# Patient Record
Sex: Female | Born: 1990 | Race: White | Hispanic: No | Smoking: Current every day smoker
Health system: Southern US, Community
[De-identification: ages and names within clinical notes are randomized; demographics above are authoritative.]

## PROBLEM LIST (undated history)

## (undated) DIAGNOSIS — G8929 Other chronic pain: Secondary | ICD-10-CM

## (undated) DIAGNOSIS — F4310 Post-traumatic stress disorder, unspecified: Secondary | ICD-10-CM

## (undated) DIAGNOSIS — R011 Cardiac murmur, unspecified: Secondary | ICD-10-CM

## (undated) DIAGNOSIS — R569 Unspecified convulsions: Secondary | ICD-10-CM

## (undated) DIAGNOSIS — E162 Hypoglycemia, unspecified: Secondary | ICD-10-CM

## (undated) DIAGNOSIS — R87629 Unspecified abnormal cytological findings in specimens from vagina: Secondary | ICD-10-CM

## (undated) DIAGNOSIS — F445 Conversion disorder with seizures or convulsions: Secondary | ICD-10-CM

## (undated) DIAGNOSIS — M25569 Pain in unspecified knee: Secondary | ICD-10-CM

## (undated) DIAGNOSIS — R51 Headache: Secondary | ICD-10-CM

## (undated) DIAGNOSIS — Z91148 Patient's other noncompliance with medication regimen for other reason: Secondary | ICD-10-CM

## (undated) DIAGNOSIS — Z9289 Personal history of other medical treatment: Secondary | ICD-10-CM

## (undated) DIAGNOSIS — F419 Anxiety disorder, unspecified: Secondary | ICD-10-CM

## (undated) DIAGNOSIS — R519 Headache, unspecified: Secondary | ICD-10-CM

## (undated) DIAGNOSIS — Z9114 Patient's other noncompliance with medication regimen: Secondary | ICD-10-CM

## (undated) HISTORY — PX: WISDOM TOOTH EXTRACTION: SHX21

## (undated) HISTORY — PX: NO PAST SURGERIES: SHX2092

## (undated) HISTORY — DX: Unspecified abnormal cytological findings in specimens from vagina: R87.629

## (undated) HISTORY — DX: Cardiac murmur, unspecified: R01.1

---
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
## 2009-03-22 ENCOUNTER — Emergency Department: Payer: Self-pay | Admitting: Emergency Medicine

## 2009-06-25 ENCOUNTER — Emergency Department: Payer: Self-pay | Admitting: Emergency Medicine

## 2009-07-04 ENCOUNTER — Emergency Department (HOSPITAL_COMMUNITY): Admission: EM | Admit: 2009-07-04 | Discharge: 2009-07-05 | Payer: Self-pay | Admitting: Emergency Medicine

## 2009-12-19 ENCOUNTER — Emergency Department (HOSPITAL_COMMUNITY): Admission: EM | Admit: 2009-12-19 | Discharge: 2009-12-19 | Payer: Self-pay | Admitting: Emergency Medicine

## 2010-05-13 LAB — DIFFERENTIAL
Basophils Absolute: 0 10*3/uL (ref 0.0–0.1)
Basophils Relative: 1 % (ref 0–1)
Eosinophils Absolute: 0.1 10*3/uL (ref 0.0–0.7)
Eosinophils Relative: 2 % (ref 0–5)
Lymphocytes Relative: 39 % (ref 12–46)
Lymphs Abs: 2.7 10*3/uL (ref 0.7–4.0)
Monocytes Absolute: 0.6 10*3/uL (ref 0.1–1.0)
Monocytes Relative: 8 % (ref 3–12)
Neutro Abs: 3.6 10*3/uL (ref 1.7–7.7)
Neutrophils Relative %: 51 % (ref 43–77)

## 2010-05-13 LAB — URINALYSIS, ROUTINE W REFLEX MICROSCOPIC
Leukocytes, UA: NEGATIVE
Nitrite: NEGATIVE
Protein, ur: NEGATIVE mg/dL
Urobilinogen, UA: 0.2 mg/dL (ref 0.0–1.0)

## 2010-05-13 LAB — COMPREHENSIVE METABOLIC PANEL
ALT: 9 U/L (ref 0–35)
AST: 16 U/L (ref 0–37)
Albumin: 3.7 g/dL (ref 3.5–5.2)
Alkaline Phosphatase: 55 U/L (ref 39–117)
BUN: 7 mg/dL (ref 6–23)
CO2: 23 mEq/L (ref 19–32)
Calcium: 9.2 mg/dL (ref 8.4–10.5)
Chloride: 108 mEq/L (ref 96–112)
Creatinine, Ser: 0.54 mg/dL (ref 0.4–1.2)
GFR calc Af Amer: >60 mL/min (ref >60)
GFR calc non Af Amer: >60 mL/min (ref >60)
Glucose, Bld: 100 mg/dL — ABNORMAL HIGH (ref 70–99)
Potassium: 3.6 mEq/L (ref 3.5–5.1)
Sodium: 137 mEq/L (ref 135–145)
Total Bilirubin: 0.4 mg/dL (ref 0.3–1.2)
Total Protein: 6.8 g/dL (ref 6.0–8.3)

## 2010-05-13 LAB — CBC
HCT: 39.8 % (ref 36.0–46.0)
Hemoglobin: 14.1 g/dL (ref 12.0–15.0)
MCHC: 35.4 g/dL (ref 30.0–36.0)
MCV: 86.5 fL (ref 78.0–100.0)
Platelets: 314 10*3/uL (ref 150–400)
RBC: 4.6 MIL/uL (ref 3.87–5.11)
RDW: 13.7 % (ref 11.5–15.5)
WBC: 7 10*3/uL (ref 4.0–10.5)

## 2010-05-13 LAB — RAPID URINE DRUG SCREEN, HOSP PERFORMED
Cocaine: NOT DETECTED
Tetrahydrocannabinol: NOT DETECTED

## 2010-05-13 LAB — PREGNANCY, URINE: Preg Test, Ur: NEGATIVE

## 2010-05-13 LAB — POCT PREGNANCY, URINE: Preg Test, Ur: NEGATIVE

## 2010-05-13 LAB — URINE MICROSCOPIC-ADD ON

## 2010-07-11 NOTE — H&P (Signed)
Patricia Irwin, Patricia Irwin NO.:  [id number]   MEDICAL RECORD NO.:  [id number]          PATIENT TYPE:  INP   LOCATION:  0103                          FACILITY:  [facility]   PHYSICIAN:  Lalla Brothers, MDDATE OF BIRTH:  07/04/1990   DATE OF ADMISSION:  12/11/2005  DATE OF DISCHARGE:                         PSYCHIATRIC ADMISSION ASSESSMENT   IDENTIFICATION:  This 20 year old female, 11th grade student at [employer], is admitted emergently voluntarily in transfer from [facility] Emergency Department for inpatient stabilization and treatment  of suicide and homicide risk with apparent depression and dangerous,  disruptive behavior.  The patient lacerated her right forearm with a razor,  producing two wounds treated in the emergency department with the patient  indicating if she was certain about dying she would cut longitudinally to  open up the artery and bleed to death quicker.  She also threatened homicide  to her 25 year old brother.  She was taken to the emergency department by  [facility] and then transported to the [facility] by  family and Child psychotherapist, apparently Bonnita Nasuti apparently with [facility].   HISTORY OF PRESENT ILLNESS:  The patient and family provide little  amplification and clarification of content and chronological course of  symptoms.  The patient reportedly has longstanding ADHD, treated by Dr.  Dolores Frame at [phone].  Apparently, she has been treated with Adderall  20 mg XR in the past but is currently taking Vyvanse, apparently the 30 mg  every morning, though the emergency room department listed as 20 mg.  The  patient has also had birth control pills in the past though apparently, when  taking birth control pill and a pain pill for dysmenorrhea and prolonged  vaginal bleeding, she had a vasovagal episode that the patient calls  seizure, but was not determined to  be a seizure in the emergency department.  The patient and family has apparently been working with Bonnita Nasuti at  [phone] and [facility] [phone] for disruptive  behavior, family difficulties, and complicating factors such as questions of  substance use and somatoform disorder difficulties.  Parents in the  emergency department imply cannabis abuse but the patient does not  acknowledge such.  Urine drug screen was negative in the emergency  department April 27, 2005 and again December 11, 2005.  She does smoke at  least five cigarettes daily, obtaining as many as she can on the bus and  then saving them so she will never completely run out.  The patient has had  numerous medical complaints in the past but has not had as many tests and  medical appointments as complaints.  She is under the primary care of Dr.  Reynolds Bowl.  She has complained of dysmenorrhea, excessive vaginal  bleeding, abdominal pain, constipation, reflux, headaches, lax knees  resulting in soreness after activities and lactose intolerance and seizures.  Medical testing and treatment has not verified diagnoses or needs of  significance currently.  The patient and 45 year old brother have apparently  been associating with  three adult males, ages 70, 71 and 77.  The patient  indicates that brother associates with them also but apparently brother has  become concerned that the patient is involved in activities with these men  beyond safety and emotional capacity.  Family suggests that police have  become involved.  The patient is not more specific about such activities at  this time.  She will not answer questions about sexual activity.  She became  overwhelmed as brother told the parents about the three man and these  activities.  The patient cut her right forearm with a razor and threatened  to kill brother and herself acutely.  Family was overwhelmed.  They do not  clarify the family mechanisms  by which the patient and brother are exposed  to these men.  The patient does not acknowledge other hypomanic or manic  symptoms.  The patient is thin and underweight with multiple contributing  factors potentially.  She does not acknowledge purging or body image  distortion..  The patient does not acknowledge specific anxiety but does  have multiple somatoform complaints over time.  She did not have definite  conversion symptoms as possible pseudoseizures.  The patient indicates a  desire for motherhood and states she likes children and pets.  She indicates  she is depressed again and apparently had such with self-cutting one year  ago and now has cut again.  She has no psychotic symptoms and no  dissociation.   PAST MEDICAL HISTORY:  The patient is under the primary care of Dr. Reynolds Bowl.  She reports a history of acid reflux, constipation, lactose  intolerance and abdominal pain with a negative ultrasound in March of 2003.  She reports dysmenorrhea, excessive menstrual bleeding, and potential  reaction to birth control pill efforts to regulate in the past with  vasovagal near syncope.  She had a negative CT scan of the head at that time  in May of 2007 in the emergency department but she does have headaches at  times.  She was at the dentist two weeks ago and has orthodontic braces.  Last menses was November 11, 2005.  She reports laxity of the knees and  soreness with activity.  She has acute right forearm lacerations.  She had  chicken pox at age 71.  She has a platelet count slightly elevated at 351,000  in the ED.  She is allergic to AUGMENTIN.  Her only current medication is  Vyvanse 30 mg every morning.  There is no heart murmur or arrhythmia.  There  is no definite seizure though she had some seizure-like movements with her  vasovagal syncope after a pain pill and birth control pill in the past.   REVIEW OF SYSTEMS:  The patient denies difficulty with gait, gaze  or continence.  She denies exposure to communicable disease or toxins.  She  denies current headache or sensory loss.  She denies memory loss or  coordination deficit.  She denies any known exposure to communicable disease  or toxins.  She has no rash, jaundice or purpura.  There is no current chest  pain, palpitations or presyncope.  There is no abdominal pain, nausea,  vomiting or diarrhea.  There is no dysuria or arthralgia currently.   IMMUNIZATIONS:  Up-to-date.   FAMILY HISTORY:  Family history is uncertain with the patient and family  providing very little information.  The patient does have a 7 year old  brother who also hangs out with the adult men, ages  20, 26 and 31.   SOCIAL AND DEVELOPMENTAL HISTORY:  The patient is an 11th grade student at  [employer].  However, she reports her goal is to be the  mother for a family of her own and she likes children and pets.  She does  have a best friend.  She provides little other information about activities  or interests.  She will not answer questions about sexual activity.  She  does smoke at least five cigarettes daily, stating she gets them on the bus  to and from school and always saves enough so she never runs out.  She is  said to have used cannabis in the emergency department.  Urine drug screen  was negative on admit and April 27, 2005 in the ED.  She denies other legal  consequences though apparently the police are involved relative to these  three adult men.   ASSETS:  The patient likes children and pets.   MENTAL STATUS EXAM:  Height is 63.5 inches and weight is 106 pounds.  Blood  pressure is 130/81 with heart rate of 70.  She is left-handed.  Cranial  nerves 2-12 are intact.  Muscle strengths and tone are normal.  There are no  pathologic reflexes or soft neurologic findings.  There are no abnormal  involuntary movements.  Gait and gaze are intact.  Mood is labile and she is  entitled and hysteroid in  her interpersonal style.  She has somatoform and  substance abuse differential diagnosis that may undermine access to other  diagnosis and treatment.  Moderate to severe depression is apparently not as  consequential as her oppositional acting out and self-injury including  cutting again.  She has suicide and homicide ideation and threats.  She has  no definite psychosis or mania.  There is no definite dissociation or  organicity.  Family seems to entitle and then retaliate in patterns of  chaotic reinforcement of ultimately negative and dangerous behavior.   IMPRESSION:  AXIS I:  Depressive disorder not otherwise specified with  atypical features.  Oppositional defiant disorder.  Attention-deficit  hyperactivity disorder, combined-type, moderate severity.  Undifferentiated  somatoform disorder (provisional diagnosis).  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  Other interpersonal  problem.  Parent-child problem.  Other specified family circumstances. AXIS II:  Diagnosis deferred.  AXIS III:  Lacerations, right forearm, thin habitus with apparent  undernutrition, lactose intolerance by history, history of acid reflux and  constipation by self-report, dysmenorrhea and hypermenorrhea by history,  thrombocytosis likely reactive, allergy to AUGMENTIN, headaches, orthodontic  braces, history of vasovagal syncope interpreted by the patient as seizure,  lax knees with mechanical discomfort, vasovagal syncope after analgesic and  birth control pill first dose.  AXIS IV:  Stressors:  Family--moderate, acute and chronic; phase of life--  moderate, acute and chronic; medical--mild, acute and chronic.  AXIS V:  GAF on admission 37; highest in last year estimated at 68.   PLAN:  The patient is admitted for inpatient adolescent psychiatric and  multidisciplinary multimodal behavioral health treatment in a team-based  program at a locked psychiatric unit.  Vyvanse  will be continued if mother  will bring the supply or we can switch back to Adderall 20 mg XR during the  hospital course.  Antidepressant pharmacotherapy such as Paxil or Celexa may  be necessary.  Wound care, multivitamin and nutrition interventions are  planned.  Cognitive behavioral therapy, anger management, somatoform  rehabilitation, family therapy, substance  abuse prevention, communication  and social skills and problem-solving and coping skills can be undertaken.   ESTIMATED LENGTH OF STAY:  Five to seven days with target symptoms for  discharge being stabilization of suicide and self-injurious risk and mood,  stabilization of homicide risk.      Lalla Brothers, MD  Electronically Signed     GEJ/MEDQ  D:  12/12/2005  T:  12/13/2005  Job:  [id number]

## 2010-07-11 NOTE — Discharge Summary (Signed)
NAMEORALIA, CRIGER NO.:  [id number]   MEDICAL RECORD NO.:  [id number]          PATIENT TYPE:  INP   LOCATION:  0103                          FACILITY:  [facility]   PHYSICIAN:  Lalla Brothers, MDDATE OF BIRTH:  [birth year]   DATE OF ADMISSION:  12/12/2005  DATE OF DISCHARGE:  12/21/2005                                 DISCHARGE SUMMARY   IDENTIFICATION:  20 year old female 11th grade student at [employer] was admitted emergently voluntarily in transfer from [facility] Emergency Department for inpatient stabilization and treatment of  suicidal and homicide risk with depression and dangerous disruptive  behavior.  The patient lacerated her right forearm and wrist with a razor  and was taken by [facility] to the [facility] after  medical stabilization at [facility].  She also threatened homicide to her 65-  year-old brother for his disclosure to parents that she had been sexually  active with two adult males who are friends of the family and brother.  For  full details, please see the typed admission assessment.   SYNOPSIS OF PRESENT ILLNESS:  The patient was initially closed to  communication reporting that she had nothing left to live for and could not  possibly face the courts prosecution of these two males and their orange  suits.  The patient states that she is in love with at least one of them,  that she has no regrets over what she has done except that they will be  unfairly punished.  The police have been involved with parents considering  there options for protecting the patient and prosecuting these men who  immigrants, possibly illegal.  The patient's last episode of cutting was one  year ago and she was treated by Dr. Omelia Blackwater for ADHD at [phone].  She has  therapy and wraparound services in the community from [facility],  [phone], with Bonnita Nasuti, her social worker, [phone].  The patient  and  family provide limited elaboration on other symptoms except parents are  concerned now though not in the past.  The patient had a negative CT of the  head in May 2007 in the emergency department, apparently having headaches at  that time.  She considers herself lactose intolerant with constipation and  acid reflux.  She has dysmenorrhea with a negative ultrasound in May 2003.  She had vasovagal near syncope when taking birth control pills for  regulation in the past.   She is allergic to AUGMENTIN.   Parents note that grades are good and she has no suspensions.  Mother has  bipolar depression requiring hospitalization in the past.  Father, mother,  brother and sisters have ADHD.  Maternal grandmother had substance abuse  with alcohol and analgesics.  The patient has tried beer and marijuana and  does smoke cigarettes.  They report the patient has been in treatment for  ADHD and consequences since age 74.  She was somewhat hypersexual in  kindergarten with masturbation.  The family has been investigated by CPS on  three occasions,  all unsubstantiated.  At the time of admission, the patient  is on Vyvanse  30 mg daily as best can be determined.   INITIAL MENTAL STATUS EXAM:  The patient was entitled and hysteroid and her  interpersonal style.  Though she had labile mood, she manifested core  dysphoria.  Though she was over animated in her oppositionality and  glorification of her acting out, this was fueled  more by family process  than any hypomania.  She had moderate to severe depression with suicidal and  homicide ideation and threats.  She had somatoform and substance abuse  fixations entitled by a chaotic family reinforcement.  The patient  retaliates when such reinforcements are challenged by anyone.   LABORATORY FINDINGS:  CBC was normal except platelet count was elevated at  351,000 with upper limit of normal 325,000.  White count was normal at 8700,  hemoglobin 13.8, MCV of  85, and WBC differential was normal except segs were  74% and lymphocytes 20%.  Comprehensive metabolic panel was normal in the  emergency department and [facility] with sodium 137, potassium 3.7, random glucose  92, creatinine 0.6, total bilirubin 0.7, calcium 9.1, albumin 3.6, AST 14,  ALT 9, and GGT 19.  Free T4 was normal at 0.99 and TSH 1.661.  Urine HCG was  negative.  Urine drug screen was negative as was blood alcohol.  Urinalysis  was normal with specific gravity of 1.029 and pH 7 with rare epithelial and  some amorphous urate crystals.  RPR was nonreactive.  HIV was nonreactive.  Urine probe for gonorrhea and chlamydia trichomatous by DNA amplification  were both negative.   HOSPITAL COURSE AND TREATMENT:  General medical exam by Mallie Darting, PA-C,  noted severe nausea and vomiting, sensitivity to Augmentin.  The patient  smokes a half-pack per day of cigarettes for seven years.  She used cannabis  one week ago.  She had menarche at age 38 and is sexually active.  She  denies any previous GYN exam.  She has orthodontic braces and acne vulgaris  of the face.  Vital signs were normal throughout hospital stay with  admission height 63 1/2 inches and weight of 106 pounds, with blood pressure  130/81 sitting and standing with heart rate of 70.  Vital signs were normal  throughout hospital stay with final blood pressure 111/64 with heart rate of  81 supine and standing blood pressure 103/70 with heart rate of 121.  The  patient's Vyvanse was discontinued and she was started on Wellbutrin  titrated up to 300 mg XL every morning.  She tolerated the medication well.  She was resistant to hospital treatment for approximately the first five  days, threatening to give up on everything and go ahead and kill herself or  join in some others subversive or disruptive behavior for punishment.  Over  the course of the hospital stay, the patient and parents worked through an understanding of the patient's  current behavior, trauma and needs.  The  parents preferred a group home for the patient and the patient preferred  that, as well.  Intensive work was necessary to gain the patient and parents  capacity to discharge the patient to their care without proceeding such as a  license to self-harm or dangerous disruptive behavior.  The patient hoped  for at least one day to return to school to say good-bye to school and  establish closure and she hopes to continue her education in the future as  her grades are good and she has worked hard in school.  The patient, by the  time of discharge, doubts there will be any prosecution for the two adult  males unless they violate the restraining order established by parents at  which time the patient anticipates they would be prosecuted for statutory  rape.  The patient had no suicidal ideation, hypomanic symptoms, or over  activation from Wellbutrin.  She had no contraindication to the use of such.   FINAL DIAGNOSES:  AXIS I:           1.  Depressive disorder not otherwise  specified with atypical features.  1. Attention deficit hyperactivity disorder, combined type, moderate      severity.  2. Oppositional defiant disorder.  3. Psychoactive substance abuse not otherwise specified.  4. Undifferentiated somatoform disorder.  5. Parent child problem.  6. Other interpersonal problem.  7. Other specified family circumstances.  AXIS II:          Diagnosis deferred.  AXIS III:         1.  Lacerations right forearm.  1. Undernutrition with thin habitus.  2. Lactose intolerance with constipation.  3. Gastroesophageal reflux.  4. Dysmenorrhea and hypermenorrhea treated with birth control pills      briefly in the past with near vasovagal                       syncope.  5. Reactive thrombocytosis.  6. Sensitive to Augmentin manifested by nausea and vomiting  7. Orthodontic braces.  8. Headaches.  9. Mechanical knee pain possibly due to lax knees.  AXIS  IV:          Stressors family moderate acute and chronic; phase of life  moderate acute and chronic, medical mild acute and                chronic;  legal moderate to severe acute.  AXIS V:           GAF on admission 37 with highest in the last year  estimated at 68 and discharge GAF was 48.   PLAN:  The patient was discharged to parents in improved condition on a  weight restoration re-feeding diet as per nutrition consult December 14, 2005, which should include at least 2000 to 2200 calories daily with 45-55  grams of protein and 2 liters of fluid.  Her BMI was 17.9, she has had a  recent 7 pounds weight loss maximally.  The patient has no restrictions on  physical activity.  Crisis and safety plans are outlined if needed.  She is  prescribed Wellbutrin 300 mg XL every morning, quantity number 30, and her  Vyvanse is discontinued.  They are educated on the medication including FDA  guidelines and black box warnings.  She will see Dr. Dolores Frame December 22, 2005, at 1730, for psychiatric follow-up.  She sees Bonnita Nasuti with  [facility] December 22, 2005, in home.      Lalla Brothers, MD  Electronically Signed     GEJ/MEDQ  D:  12/25/2005  T:  12/26/2005  Job:  [id number]   cc:   Dolores Frame, M.D.   Bonnita Nasuti

## 2010-11-23 ENCOUNTER — Emergency Department (HOSPITAL_COMMUNITY)
Admission: EM | Admit: 2010-11-23 | Discharge: 2010-11-23 | Disposition: A | Discharge status: Home / Self Care | Payer: Self-pay | Attending: Emergency Medicine | Admitting: Emergency Medicine

## 2010-11-23 ENCOUNTER — Encounter: Payer: Self-pay | Admitting: Emergency Medicine

## 2010-11-23 DIAGNOSIS — K5289 Other specified noninfective gastroenteritis and colitis: Secondary | ICD-10-CM | POA: Insufficient documentation

## 2010-11-23 DIAGNOSIS — R197 Diarrhea, unspecified: Secondary | ICD-10-CM | POA: Insufficient documentation

## 2010-11-23 DIAGNOSIS — K529 Noninfective gastroenteritis and colitis, unspecified: Secondary | ICD-10-CM

## 2010-11-23 DIAGNOSIS — F17200 Nicotine dependence, unspecified, uncomplicated: Secondary | ICD-10-CM | POA: Insufficient documentation

## 2010-11-23 DIAGNOSIS — R1031 Right lower quadrant pain: Secondary | ICD-10-CM | POA: Insufficient documentation

## 2010-11-23 HISTORY — DX: Unspecified convulsions: R56.9

## 2010-11-23 LAB — CBC
HCT: 40.3 % (ref 36.0–46.0)
RBC: 4.58 MIL/uL (ref 3.87–5.11)
RDW: 13.2 % (ref 11.5–15.5)
WBC: 7.6 10*3/uL (ref 4.0–10.5)

## 2010-11-23 LAB — DIFFERENTIAL
Basophils Absolute: 0 10*3/uL (ref 0.0–0.1)
Lymphocytes Relative: 23 % (ref 12–46)
Lymphs Abs: 1.7 10*3/uL (ref 0.7–4.0)
Monocytes Absolute: 0.6 10*3/uL (ref 0.1–1.0)
Neutro Abs: 5.1 10*3/uL (ref 1.7–7.7)

## 2010-11-23 LAB — COMPREHENSIVE METABOLIC PANEL
ALT: 7 U/L (ref 0–35)
AST: 13 U/L (ref 0–37)
CO2: 24 mEq/L (ref 19–32)
Chloride: 103 mEq/L (ref 96–112)
GFR calc non Af Amer: >60 mL/min (ref >60)
Sodium: 135 mEq/L (ref 135–145)
Total Bilirubin: 0.1 mg/dL — ABNORMAL LOW (ref 0.3–1.2)

## 2010-11-23 MED ORDER — SODIUM CHLORIDE 0.9 % IV BOLUS (SEPSIS)
1000.0000 mL | Freq: Once | INTRAVENOUS | Status: AC
  Administered 2010-11-23: 1000 mL via INTRAVENOUS

## 2010-11-23 MED ORDER — TRAMADOL HCL 50 MG PO TABS: 50.0000 mg | ORAL_TABLET | Freq: Four times a day (QID) | ORAL | Status: AC | PRN

## 2010-11-23 MED ORDER — KETOROLAC TROMETHAMINE 30 MG/ML IJ SOLN
30.0000 mg | Freq: Once | INTRAMUSCULAR | Status: AC
  Administered 2010-11-23: 30 mg via INTRAVENOUS
  Filled 2010-11-23: qty 1

## 2010-11-23 NOTE — ED Notes (Signed)
Pt c/o diarrhea since last night with increasing rlq abd pain and n/v today.

## 2010-11-23 NOTE — ED Provider Notes (Signed)
Scribed for Benny Lennert, MD, the patient was seen in room APA12/[facility]12 . This chart was scribed by Ellie Lunch. This patient's care was started at 3:22 PM.   CSN: [id number] Arrival date & time: 11/23/2010  2:50 PM  Chief Complaint  Patient presents with  . Abdominal Pain  . Diarrhea   Patient was seen at 15:22.  Patient is a 20 y.o. female presenting with abdominal pain and diarrhea. The history is provided by the patient.  Abdominal Pain The primary symptoms of the illness include abdominal pain and diarrhea. The current episode started yesterday. The onset of the illness was sudden.  The abdominal pain began yesterday. The pain came on suddenly. The abdominal pain is located in the RLQ. The abdominal pain does not radiate. The abdominal pain is relieved by nothing. The abdominal pain is exacerbated by movement.  The diarrhea began yesterday. The diarrhea is watery. The diarrhea occurs more than 10 times per day.  Symptoms associated with the illness do not include hematuria, frequency or back pain.  Diarrhea The primary symptoms include abdominal pain and diarrhea. Primary symptoms do not include rash.  The illness does not include back pain.   LUQ Abd pain started lat night with associated N/v/d. Pain is sharp, intermittent and aggravated by standing.  15-20 episodes of diarrhea since onset like water. Denies bloody stool. No recent sick contacts.    Past Medical History  Diagnosis Date  . Seizures     History reviewed. No pertinent past surgical history.  Family History  Problem Relation Age of Onset  . Diabetes Father     History  Substance Use Topics  . Smoking status: Current Everyday Smoker -- 0.5 packs/day  . Smokeless tobacco: Not on file  . Alcohol Use: Yes     occasional    Review of Systems  HENT: Negative for congestion, sinus pressure and ear discharge.   Eyes: Negative for discharge.  Respiratory: Negative for cough.   Cardiovascular: Negative for  chest pain.  Gastrointestinal: Positive for abdominal pain and diarrhea.  Genitourinary: Negative for frequency and hematuria.  Musculoskeletal: Negative for back pain.  Skin: Negative for rash.  Neurological: Negative for seizures and headaches.  Hematological: Negative.   Psychiatric/Behavioral: Negative for hallucinations.    Allergies  Amoxicillin and Augmentin  Home Medications   Current Outpatient Rx  Name Route Sig Dispense Refill  . LEVETIRACETAM 500 MG PO TB24 Oral Take 1,000 mg by mouth daily.      Marland Kitchen NORGESTIM-ETH ESTRAD TRIPHASIC 0.18/0.215/0.25 MG-25 MCG PO TABS Oral Take 1 tablet by mouth daily.        BP 124/86  Pulse 71  Temp(Src) 97.3 F (36.3 C) (Oral)  Resp 20  Ht 5\' 3"  (1.6 m)  Wt 125 lb (56.7 kg)  BMI 22.14 kg/m2  SpO2 96%  LMP 10/23/2010  Physical Exam  Nursing note and vitals reviewed. Constitutional: She is oriented to person, place, and time. She appears well-developed.  HENT:  Head: Normocephalic and atraumatic.       Dry mucous membranes  Eyes: Conjunctivae and EOM are normal. No scleral icterus.  Neck: Neck supple. No thyromegaly present.  Cardiovascular: Normal rate and regular rhythm.  Exam reveals no gallop and no friction rub.   No murmur heard. Pulmonary/Chest: No stridor. She has no wheezes. She has no rales. She exhibits no tenderness.  Abdominal: There is tenderness (mild tenderness right lower quadrant. ).  Musculoskeletal: Normal range of motion. She exhibits no edema.  Lymphadenopathy:    She has no cervical adenopathy.  Neurological: She is oriented to person, place, and time. Coordination normal.  Skin: No rash noted. No erythema.  Psychiatric: She has a normal mood and affect. Her behavior is normal.   Procedures   OTHER DATA REVIEWED: Nursing notes, vital signs, and past medical records reviewed.   DIAGNOSTIC STUDIES: Oxygen Saturation is 96% on room air, normal by my interpretation.    LABS / RADIOLOGY:  Labs  Reviewed  COMPREHENSIVE METABOLIC PANEL - Abnormal; Notable for the following:    Potassium 3.3 (*)    Total Bilirubin 0.1 (*)    All other components within normal limits  CBC  DIFFERENTIAL    ED COURSE / COORDINATION OF CARE: 15:30- EDP at PT bedside for PE.  Pt's mucous membranes are dry. IV fluids ordered. And Toradol 30mg /ml ordered for pain management.   16:18 Pt recheck. Pt reports improvement of pain. EDP discussed lab results and diagnostic possibilities including virus. Discussed discharge.  SCRIBE ATTESTATION:  The chart was scribed for me under my direct supervision.  I personally performed the history, physical, and medical decision making and all procedures in the evaluation of this patient.Benny Lennert, MD 11/23/10 [phone]

## 2011-10-06 ENCOUNTER — Encounter (HOSPITAL_COMMUNITY): Payer: Self-pay | Admitting: Emergency Medicine

## 2011-10-06 ENCOUNTER — Emergency Department (HOSPITAL_COMMUNITY)
Admission: EM | Admit: 2011-10-06 | Discharge: 2011-10-07 | Disposition: A | Discharge status: Home / Self Care | Payer: Self-pay | Attending: Emergency Medicine | Admitting: Emergency Medicine

## 2011-10-06 DIAGNOSIS — G40909 Epilepsy, unspecified, not intractable, without status epilepticus: Secondary | ICD-10-CM | POA: Insufficient documentation

## 2011-10-06 DIAGNOSIS — R569 Unspecified convulsions: Secondary | ICD-10-CM

## 2011-10-06 DIAGNOSIS — F17200 Nicotine dependence, unspecified, uncomplicated: Secondary | ICD-10-CM | POA: Insufficient documentation

## 2011-10-06 DIAGNOSIS — Z9119 Patient's noncompliance with other medical treatment and regimen: Secondary | ICD-10-CM | POA: Insufficient documentation

## 2011-10-06 DIAGNOSIS — Z91199 Patient's noncompliance with other medical treatment and regimen due to unspecified reason: Secondary | ICD-10-CM | POA: Insufficient documentation

## 2011-10-06 LAB — URINALYSIS, ROUTINE W REFLEX MICROSCOPIC
Bilirubin Urine: NEGATIVE
Glucose, UA: NEGATIVE mg/dL
Ketones, ur: NEGATIVE mg/dL
Protein, ur: NEGATIVE mg/dL

## 2011-10-06 LAB — URINE MICROSCOPIC-ADD ON

## 2011-10-06 MED ORDER — IBUPROFEN 800 MG PO TABS
800.0000 mg | ORAL_TABLET | Freq: Once | ORAL | Status: AC
  Administered 2011-10-06: 800 mg via ORAL
  Filled 2011-10-06: qty 1

## 2011-10-06 MED ORDER — HYDROCODONE-ACETAMINOPHEN 5-325 MG PO TABS
1.0000 | ORAL_TABLET | Freq: Once | ORAL | Status: AC
  Administered 2011-10-06: 1 via ORAL
  Filled 2011-10-06: qty 1

## 2011-10-06 NOTE — ED Notes (Addendum)
Family stated patient had a seizure that lasted 2-3 minutes with LOC for approximately 10 minutes about 1 hour ago. Patient alert and oriented and complaining of headache. States she has a history of seizures but is unable to afford her medication.

## 2011-10-06 NOTE — ED Notes (Signed)
Assisted patient to bathroom via wheelchair because patient stated she felt weak.

## 2011-10-07 NOTE — ED Provider Notes (Addendum)
History     CSN: [id number]  Arrival date & time 10/06/11  2248   First MD Initiated Contact with Patient 10/06/11 2315      Chief Complaint  Patient presents with  . Seizures    (Consider location/radiation/quality/duration/timing/severity/associated sxs/prior treatment) HPI  Patricia Irwin is a 21 y.o. female with a h/o seizure disorderwho presents to the Emergency Department complaining of seizure earlier today. She is prescribed Keppra however has not taken it in oer 3 months as she is unable to afford the medicine. She is unable to take depakote, dilantin, or lamictal. She is followed by neurology at [facility].   Past Medical History  Diagnosis Date  . Seizures     History reviewed. No pertinent past surgical history.  Family History  Problem Relation Age of Onset  . Diabetes Father     History  Substance Use Topics  . Smoking status: Current Everyday Smoker -- 0.5 packs/day  . Smokeless tobacco: Not on file  . Alcohol Use: Yes     occasional    OB History    Grav Para Term Preterm Abortions TAB SAB Ect Mult Living   0               Review of Systems  Constitutional: Negative for fever.       10 Systems reviewed and are negative for acute change except as noted in the HPI.  HENT: Negative for congestion.   Eyes: Negative for discharge and redness.  Respiratory: Negative for cough and shortness of breath.   Cardiovascular: Negative for chest pain.  Gastrointestinal: Negative for vomiting and abdominal pain.  Musculoskeletal: Negative for back pain.  Skin: Negative for rash.  Neurological: Positive for seizures. Negative for syncope, numbness and headaches.  Psychiatric/Behavioral:       No behavior change.    Allergies  Amoxicillin and Amoxicillin-pot clavulanate  Home Medications   Current Outpatient Rx  Name Route Sig Dispense Refill  . LEVETIRACETAM ER 500 MG PO TB24 Oral Take 1,000 mg by mouth daily.      Marland Kitchen NORGESTIM-ETH ESTRAD  TRIPHASIC 0.18/0.215/0.25 MG-25 MCG PO TABS Oral Take 1 tablet by mouth daily.        BP 116/79  Pulse 94  Temp 98.3 F (36.8 C) (Oral)  Resp 24  Ht 5\' 3"  (1.6 m)  Wt 128 lb (58.06 kg)  BMI 22.67 kg/m2  SpO2 99%  LMP 10/06/2011  Physical Exam  Nursing note and vitals reviewed. Constitutional: She is oriented to person, place, and time. She appears well-developed and well-nourished.       Awake, alert, nontoxic appearance.  HENT:  Head: Atraumatic.  Eyes: Right eye exhibits no discharge. Left eye exhibits no discharge.  Neck: Neck supple.  Cardiovascular: Normal heart sounds.   Pulmonary/Chest: Effort normal and breath sounds normal. She exhibits no tenderness.  Abdominal: Soft. There is no tenderness. There is no rebound.  Musculoskeletal: She exhibits no tenderness.       Baseline ROM, no obvious new focal weakness.  Neurological: She is alert and oriented to person, place, and time. She has normal reflexes.       Mental status and motor strength appears baseline for patient and situation.  Skin: No rash noted.  Psychiatric: She has a normal mood and affect.    ED Course  Procedures (including critical care time)  Results for orders placed during the hospital encounter of 10/06/11  PREGNANCY, URINE      Component Value Range  Preg Test, Ur NEGATIVE  NEGATIVE  URINALYSIS, ROUTINE W REFLEX MICROSCOPIC      Component Value Range   Color, Urine YELLOW  YELLOW   APPearance CLEAR  CLEAR   Specific Gravity, Urine 1.020  1.005 - 1.030   pH 7.0  5.0 - 8.0   Glucose, UA NEGATIVE  NEGATIVE mg/dL   Hgb urine dipstick LARGE (*) NEGATIVE   Bilirubin Urine NEGATIVE  NEGATIVE   Ketones, ur NEGATIVE  NEGATIVE mg/dL   Protein, ur NEGATIVE  NEGATIVE mg/dL   Urobilinogen, UA 0.2  0.0 - 1.0 mg/dL   Nitrite NEGATIVE  NEGATIVE   Leukocytes, UA NEGATIVE  NEGATIVE  URINE MICROSCOPIC-ADD ON      Component Value Range   Squamous Epithelial / LPF RARE  RARE   WBC, UA 0-2  <3 WBC/hpf    RBC / HPF 3-6  <3 RBC/hpf   Bacteria, UA FEW (*) RARE   No results found.   1. Seizure       MDM  Patient with a seizure disorder here with a seizure earlier today. She is currently not on medicines due to the cost. She does not want medicines here. She has been given analgesic for a headache. Pt stable in ED with no significant deterioration in condition.The patient appears reasonably screened and/or stabilized for discharge and I doubt any other medical condition or other [facility] requiring further screening, evaluation, or treatment in the ED at this time prior to discharge.  MDM Reviewed: nursing note and vitals Interpretation: labs           Nicoletta Dress. Colon Branch, MD 10/07/11 4540  Nicoletta Dress. Colon Branch, MD 11/19/11 [phone]

## 2012-02-18 ENCOUNTER — Encounter (HOSPITAL_COMMUNITY): Payer: Self-pay | Admitting: Emergency Medicine

## 2012-02-18 ENCOUNTER — Emergency Department (HOSPITAL_COMMUNITY)
Admission: EM | Admit: 2012-02-18 | Discharge: 2012-02-18 | Disposition: A | Discharge status: Home / Self Care | Payer: Medicaid Other | Attending: Emergency Medicine | Admitting: Emergency Medicine

## 2012-02-18 DIAGNOSIS — IMO0001 Reserved for inherently not codable concepts without codable children: Secondary | ICD-10-CM | POA: Insufficient documentation

## 2012-02-18 DIAGNOSIS — Y929 Unspecified place or not applicable: Secondary | ICD-10-CM | POA: Insufficient documentation

## 2012-02-18 DIAGNOSIS — S46909A Unspecified injury of unspecified muscle, fascia and tendon at shoulder and upper arm level, unspecified arm, initial encounter: Secondary | ICD-10-CM | POA: Insufficient documentation

## 2012-02-18 DIAGNOSIS — F17200 Nicotine dependence, unspecified, uncomplicated: Secondary | ICD-10-CM | POA: Insufficient documentation

## 2012-02-18 DIAGNOSIS — G40909 Epilepsy, unspecified, not intractable, without status epilepticus: Secondary | ICD-10-CM | POA: Insufficient documentation

## 2012-02-18 DIAGNOSIS — Y990 Civilian activity done for income or pay: Secondary | ICD-10-CM | POA: Insufficient documentation

## 2012-02-18 DIAGNOSIS — S46919A Strain of unspecified muscle, fascia and tendon at shoulder and upper arm level, unspecified arm, initial encounter: Secondary | ICD-10-CM

## 2012-02-18 DIAGNOSIS — S4980XA Other specified injuries of shoulder and upper arm, unspecified arm, initial encounter: Secondary | ICD-10-CM | POA: Insufficient documentation

## 2012-02-18 DIAGNOSIS — X503XXA Overexertion from repetitive movements, initial encounter: Secondary | ICD-10-CM | POA: Insufficient documentation

## 2012-02-18 MED ORDER — CYCLOBENZAPRINE HCL 10 MG PO TABS: 10.0000 mg | ORAL_TABLET | Freq: Three times a day (TID) | ORAL | Status: DC | PRN

## 2012-02-18 MED ORDER — HYDROCODONE-ACETAMINOPHEN 5-325 MG PO TABS
1.0000 | ORAL_TABLET | Freq: Once | ORAL | Status: AC
  Administered 2012-02-18: 1 via ORAL
  Filled 2012-02-18: qty 1

## 2012-02-18 MED ORDER — CYCLOBENZAPRINE HCL 10 MG PO TABS
10.0000 mg | ORAL_TABLET | Freq: Once | ORAL | Status: AC
  Administered 2012-02-18: 10 mg via ORAL
  Filled 2012-02-18: qty 1

## 2012-02-18 MED ORDER — HYDROCODONE-ACETAMINOPHEN 5-325 MG PO TABS: ORAL_TABLET | ORAL | Status: DC

## 2012-02-18 NOTE — ED Notes (Signed)
Pain left shoulder gradual increase in pain, worse yesterday and today.  Feels like sharp pain shooting in shoulder at times.  Worse with movement

## 2012-02-18 NOTE — ED Provider Notes (Signed)
History     CSN: [id number]  Arrival date & time 02/18/12  2205   First MD Initiated Contact with Patient 02/18/12 2222      Chief Complaint  Patient presents with  . Shoulder Pain    (Consider location/radiation/quality/duration/timing/severity/associated sxs/prior treatment) HPI Comments: Patient c/o increasing pain and "soreness" to the left trapezius muscles and along the scapula.  She describes the pain as sharp and burning.  Pain is worse with movement of the left arm and improves with rest and ibuprofen.  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  She denies fall, numbness weakness of the extremity, neck or chest pain , or pain to the shoulder joint.    Patient is a 21 y.o. female presenting with shoulder injury. The history is provided by the patient.  Shoulder Injury This is a new problem. The current episode started yesterday. The problem occurs constantly. The problem has been gradually worsening. Associated symptoms include myalgias. Pertinent negatives include no abdominal pain, arthralgias, chest pain, chills, coughing, fever, headaches, joint swelling, nausea, neck pain, numbness, rash, sore throat, vomiting or weakness. The symptoms are aggravated by twisting (lifting, movement and palpation). She has tried nothing for the symptoms. The treatment provided no relief.    Past Medical History  Diagnosis Date  . Seizures     History reviewed. No pertinent past surgical history.  Family History  Problem Relation Age of Onset  . Diabetes Father     History  Substance Use Topics  . Smoking status: Current Every Day Smoker -- 0.5 packs/day  . Smokeless tobacco: Not on file  . Alcohol Use: Yes     Comment: occasional    OB History    Grav Para Term Preterm Abortions TAB SAB Ect Mult Living   0               Review of Systems  Constitutional: Negative for fever and chills.  HENT: Negative for sore throat, facial swelling, neck pain and neck  stiffness.   Respiratory: Negative for cough and chest tightness.   Cardiovascular: Negative for chest pain.  Gastrointestinal: Negative for nausea, vomiting and abdominal pain.  Genitourinary: Negative for dysuria and difficulty urinating.  Musculoskeletal: Positive for myalgias. Negative for back pain, joint swelling and arthralgias.  Skin: Negative for color change, rash and wound.  Neurological: Negative for dizziness, weakness, numbness and headaches.  All other systems reviewed and are negative.    Allergies  Amoxicillin; Amoxicillin-pot clavulanate; and Dilantin  Home Medications   Current Outpatient Rx  Name  Route  Sig  Dispense  Refill  . LEVETIRACETAM ER 500 MG PO TB24   Oral   Take 1,000 mg by mouth daily.           Marland Kitchen NORGESTIM-ETH ESTRAD TRIPHASIC 0.18/0.215/0.25 MG-25 MCG PO TABS   Oral   Take 1 tablet by mouth daily.             BP 119/91  Pulse 80  Temp 98.2 F (36.8 C) (Oral)  Resp 18  Ht 5\' 3"  (1.6 m)  Wt 133 lb (60.328 kg)  BMI 23.56 kg/m2  SpO2 98%  LMP 01/21/2012  Physical Exam  Nursing note and vitals reviewed. Constitutional: She is oriented to person, place, and time. She appears well-developed and well-nourished. No distress.  HENT:  Head: Normocephalic and atraumatic.  Eyes: EOM are normal. Pupils are equal, round, and reactive to light.  Neck: Normal range of motion. Neck supple.  Cardiovascular: Normal rate, regular rhythm, normal heart sounds and intact distal pulses.   No murmur heard. Pulmonary/Chest: Effort normal and breath sounds normal. No respiratory distress. She exhibits no tenderness.  Musculoskeletal: She exhibits tenderness. She exhibits no edema.       Left shoulder: She exhibits tenderness and pain. She exhibits normal range of motion, no bony tenderness, no swelling, no effusion, no crepitus, no deformity, no laceration, no spasm, normal pulse and normal strength.       Arms:      ttp of the left trapezius and along  the border of the scapula.  Pain also reproduced with abduction of the left arm.  Radial pulse brisk, distal sensation intact, CR< 2 sec.  Grip strength is strong and equal bilaterally  Lymphadenopathy:    She has no cervical adenopathy.  Neurological: She is alert and oriented to person, place, and time. No cranial nerve deficit or sensory deficit. She exhibits normal muscle tone. Coordination normal.  Reflex Scores:      Tricep reflexes are 2+ on the right side and 2+ on the left side.      Bicep reflexes are 2+ on the right side and 2+ on the left side. Skin: Skin is warm and dry.    ED Course  Procedures (including critical care time)  Labs Reviewed - No data to display No results found.      MDM     Patient has ttp of the left trapezius muscle and the muscles along the border of the left scapula.  Pain is also reproduced with abduction of the left arm.  No cervical tenderness, no focal neuro deficits, no meningeal signs, no tenderness of the shoulder joint.  Pain is likely musculoskeletal.  Will treat with NSAID, norco and flexeril.  Will also give referral for Dr. Romeo Apple if needed      Noah Lembke L. [place], [place] 02/19/12 2238

## 2012-02-18 NOTE — ED Notes (Signed)
Thinks this is related to work.  No specific injury but is aggravated by work activities

## 2012-02-20 NOTE — ED Provider Notes (Signed)
Medical screening examination/treatment/procedure(s) were performed by non-physician practitioner and as supervising physician I was immediately available for consultation/collaboration.   Laray Anger, DO 02/20/12 1713

## 2012-08-26 ENCOUNTER — Emergency Department (HOSPITAL_COMMUNITY)
Admission: EM | Admit: 2012-08-26 | Discharge: 2012-08-26 | Disposition: A | Discharge status: Home / Self Care | Payer: Medicaid Other | Attending: Emergency Medicine | Admitting: Emergency Medicine

## 2012-08-26 ENCOUNTER — Encounter (HOSPITAL_COMMUNITY): Payer: Self-pay

## 2012-08-26 DIAGNOSIS — F17200 Nicotine dependence, unspecified, uncomplicated: Secondary | ICD-10-CM | POA: Insufficient documentation

## 2012-08-26 DIAGNOSIS — Z79899 Other long term (current) drug therapy: Secondary | ICD-10-CM | POA: Insufficient documentation

## 2012-08-26 DIAGNOSIS — G40909 Epilepsy, unspecified, not intractable, without status epilepticus: Secondary | ICD-10-CM | POA: Insufficient documentation

## 2012-08-26 DIAGNOSIS — Z880 Allergy status to penicillin: Secondary | ICD-10-CM | POA: Insufficient documentation

## 2012-08-26 DIAGNOSIS — M25562 Pain in left knee: Secondary | ICD-10-CM

## 2012-08-26 DIAGNOSIS — M25569 Pain in unspecified knee: Secondary | ICD-10-CM | POA: Insufficient documentation

## 2012-08-26 HISTORY — DX: Pain in unspecified knee: M25.569

## 2012-08-26 HISTORY — DX: Other chronic pain: G89.29

## 2012-08-26 MED ORDER — KETOROLAC TROMETHAMINE 10 MG PO TABS
10.0000 mg | ORAL_TABLET | Freq: Once | ORAL | Status: AC
Start: 2012-08-26 — End: 2012-08-26
  Administered 2012-08-26: 10 mg via ORAL
  Filled 2012-08-26: qty 1

## 2012-08-26 MED ORDER — PREDNISONE 50 MG PO TABS
60.0000 mg | ORAL_TABLET | Freq: Once | ORAL | Status: AC
  Administered 2012-08-26: 60 mg via ORAL
  Filled 2012-08-26: qty 1

## 2012-08-26 MED ORDER — ACETAMINOPHEN-CODEINE #3 300-30 MG PO TABS: 1.0000 | ORAL_TABLET | Freq: Four times a day (QID) | ORAL | Status: DC | PRN

## 2012-08-26 MED ORDER — DICLOFENAC SODIUM 75 MG PO TBEC: 75.0000 mg | DELAYED_RELEASE_TABLET | Freq: Two times a day (BID) | ORAL | Status: DC

## 2012-08-26 NOTE — ED Provider Notes (Signed)
History    CSN: [id number] Arrival date & time 08/26/12  1752  First MD Initiated Contact with Patient 08/26/12 1827     Chief Complaint  Patient presents with  . Knee Pain   (Consider location/radiation/quality/duration/timing/severity/associated sxs/prior Treatment) Patient is a 22 y.o. female presenting with knee pain. The history is provided by the patient.  Knee Pain Location:  Knee Time since incident: Patient has had knee pain since age 30. It has been worse over the last 3-4 days. Knee location:  L knee Pain details:    Quality:  Aching (popping sensation)   Radiates to:  Does not radiate   Severity:  Moderate   Onset quality:  Gradual   Timing:  Intermittent   Progression:  Worsening Chronicity:  Chronic Dislocation: no   Prior injury to area:  Yes Relieved by:  Nothing Worsened by:  Bearing weight, activity, extension and flexion Ineffective treatments:  Rest Associated symptoms: decreased ROM   Associated symptoms: no back pain, no neck pain and no numbness   Risk factors: no frequent fractures and no recent illness    Past Medical History  Diagnosis Date  . Seizures   . Chronic knee pain    History reviewed. No pertinent past surgical history. Family History  Problem Relation Age of Onset  . Diabetes Father    History  Substance Use Topics  . Smoking status: Current Every Day Smoker -- 0.50 packs/day    Types: Cigarettes  . Smokeless tobacco: Not on file  . Alcohol Use: Yes     Comment: occasional   OB History   Grav Para Term Preterm Abortions TAB SAB Ect Mult Living   0              Review of Systems  Constitutional: Negative for activity change.       All ROS Neg except as noted in HPI  HENT: Negative for nosebleeds and neck pain.   Eyes: Negative for photophobia and discharge.  Respiratory: Negative for cough, shortness of breath and wheezing.   Cardiovascular: Negative for chest pain and palpitations.  Gastrointestinal: Negative for  abdominal pain and blood in stool.  Genitourinary: Negative for dysuria, frequency and hematuria.  Musculoskeletal: Positive for arthralgias. Negative for back pain.  Skin: Negative.   Neurological: Negative for dizziness, seizures and speech difficulty.  Psychiatric/Behavioral: Negative for hallucinations and confusion.    Allergies  Amoxicillin; Amoxicillin-pot clavulanate; Dilantin; and Penicillins  Home Medications   Current Outpatient Rx  Name  Route  Sig  Dispense  Refill  . levETIRAcetam (KEPPRA XR) 500 MG 24 hr tablet   Oral   Take 1,000 mg by mouth at bedtime.          . Norgestimate-Ethinyl Estradiol Triphasic (ORTHO TRI-CYCLEN LO) 0.18/0.215/0.25 MG-25 MCG tablet   Oral   Take 1 tablet by mouth daily.            BP 138/93  Pulse 84  Temp(Src) 98.1 F (36.7 C) (Oral)  Resp 20  Ht 5\' 3"  (1.6 m)  Wt 133 lb (60.328 kg)  BMI 23.57 kg/m2  SpO2 100%  LMP 08/10/2012 Physical Exam  Nursing note and vitals reviewed. Constitutional: She is oriented to person, place, and time. She appears well-developed and well-nourished.  Non-toxic appearance.  HENT:  Head: Normocephalic.  Right Ear: Tympanic membrane and external ear normal.  Left Ear: Tympanic membrane and external ear normal.  Eyes: EOM and lids are normal. Pupils are equal, round, and reactive to  light.  Neck: Normal range of motion. Neck supple. Carotid bruit is not present.  Cardiovascular: Normal rate, regular rhythm, normal heart sounds, intact distal pulses and normal pulses.   Pulmonary/Chest: Breath sounds normal. No respiratory distress.  Abdominal: Soft. Bowel sounds are normal. There is no tenderness. There is no guarding.  Musculoskeletal: Normal range of motion.  Crepitus with flex and extension. No effusion. No posterior mass.  No gross deformity. Distal pulse wnl.  Lymphadenopathy:       Head (right side): No submandibular adenopathy present.       Head (left side): No submandibular adenopathy  present.    She has no cervical adenopathy.  Neurological: She is alert and oriented to person, place, and time. She has normal strength. No cranial nerve deficit or sensory deficit.  Skin: Skin is warm and dry.  Psychiatric: She has a normal mood and affect. Her speech is normal.    ED Course  Procedures (including critical care time) Labs Reviewed - No data to display No results found. No diagnosis found.  MDM  **I have reviewed nursing notes, vital signs, and all appropriate lab and imaging results for this patient.* Pt states she has had problem with left knee since age 80. She has pain and has heard a click in the joint. No effusion or deformity. Suspect internal derangement of the knee. Pt fitted with splint/immobilizer. Rx for tylenol #3 and voltaren given. Pt referred to  Orthopedics.  Kathie Dike, PA-C 08/30/12 1616

## 2012-08-26 NOTE — ED Notes (Signed)
Left knee pain, says she had an "old injury", but 2 mos ago when squatted felt a pop in her knee. And has hurt since then.  Has not sought tx since injury. Wearing a knee support.

## 2012-08-26 NOTE — ED Notes (Signed)
Pt injured her left knee when she was 14 and cont. To have pain , has a brace in place and is hearing a"clicking noise from the joint'

## 2012-08-31 NOTE — ED Provider Notes (Signed)
Medical screening examination/treatment/procedure(s) were performed by non-physician practitioner and as supervising physician I was immediately available for consultation/collaboration.  Juliet Rude. Rubin Payor, MD 08/31/12 1610

## 2012-11-22 ENCOUNTER — Emergency Department (HOSPITAL_COMMUNITY)
Admission: EM | Admit: 2012-11-22 | Discharge: 2012-11-22 | Disposition: A | Discharge status: Home / Self Care | Payer: Medicaid Other | Attending: Emergency Medicine | Admitting: Emergency Medicine

## 2012-11-22 ENCOUNTER — Encounter (HOSPITAL_COMMUNITY): Payer: Self-pay | Admitting: *Deleted

## 2012-11-22 DIAGNOSIS — G40909 Epilepsy, unspecified, not intractable, without status epilepticus: Secondary | ICD-10-CM | POA: Insufficient documentation

## 2012-11-22 DIAGNOSIS — R6884 Jaw pain: Secondary | ICD-10-CM | POA: Insufficient documentation

## 2012-11-22 DIAGNOSIS — R1110 Vomiting, unspecified: Secondary | ICD-10-CM | POA: Insufficient documentation

## 2012-11-22 DIAGNOSIS — F17200 Nicotine dependence, unspecified, uncomplicated: Secondary | ICD-10-CM | POA: Insufficient documentation

## 2012-11-22 DIAGNOSIS — R569 Unspecified convulsions: Secondary | ICD-10-CM

## 2012-11-22 DIAGNOSIS — Z79899 Other long term (current) drug therapy: Secondary | ICD-10-CM | POA: Insufficient documentation

## 2012-11-22 DIAGNOSIS — Z880 Allergy status to penicillin: Secondary | ICD-10-CM | POA: Insufficient documentation

## 2012-11-22 DIAGNOSIS — R51 Headache: Secondary | ICD-10-CM | POA: Insufficient documentation

## 2012-11-22 DIAGNOSIS — R5381 Other malaise: Secondary | ICD-10-CM | POA: Insufficient documentation

## 2012-11-22 MED ORDER — LEVETIRACETAM 500 MG/5ML IV SOLN
500.0000 mg | Freq: Once | INTRAVENOUS | Status: AC
  Administered 2012-11-22: 500 mg via INTRAVENOUS
  Filled 2012-11-22: qty 5

## 2012-11-22 MED ORDER — LEVETIRACETAM 500 MG/5ML IV SOLN
INTRAVENOUS | Status: AC
  Filled 2012-11-22: qty 5

## 2012-11-22 MED ORDER — LEVETIRACETAM 500 MG PO TABS: 1000.0000 mg | ORAL_TABLET | Freq: Every day | ORAL | Status: DC

## 2012-11-22 MED ORDER — KETOROLAC TROMETHAMINE 30 MG/ML IJ SOLN
30.0000 mg | Freq: Once | INTRAMUSCULAR | Status: AC
  Administered 2012-11-22: 30 mg via INTRAVENOUS
  Filled 2012-11-22: qty 1

## 2012-11-22 NOTE — ED Provider Notes (Signed)
CSN: [id number]     Arrival date & time 11/22/12  1914 History  This chart was scribed for Roney Marion, MD by Blanchard Kelch, ED Scribe. The patient was seen in room APA04/APA04. Patient's care was started at 7:57 PM.    No chief complaint on file.   The history is provided by the patient. No language interpreter was used.    HPI Comments: Patricia Irwin is a 22 y.o. female who presents to the Emergency Department due to a seizure that occurred a few hours ago. The seizure lasted about a minute and a half. Her boyfriend states she had a few residual jerks after the seizure ended and her jaw remained clenched. She was also confused after the seizure and was "talking nonsensical," but it has subsided. She was first diagnosed with seizures when she had her first at 2. She had another at 29 after she stopped taking bipolar medication and then 3-4 more since then. She has been on Keppra (1000 mg/day) for about a year and a half and denies having seizures while taking it. She has been off of the Keppra for a month and a half because she ran out of the medication and forgot to call her doctor. She denies any injury with the seizure. She had one episode of emesis after the seizure and complains of headache, jaw pain and weakness in the lower extremities that she states is normal after a seizure. She denies chest pain, neck pain or back pain.    Past Medical History  Diagnosis Date  . Seizures   . Chronic knee pain    History reviewed. No pertinent past surgical history. Family History  Problem Relation Age of Onset  . Diabetes Father    History  Substance Use Topics  . Smoking status: Current Every Day Smoker -- 0.50 packs/day    Types: Cigarettes  . Smokeless tobacco: Not on file  . Alcohol Use: Yes     Comment: occasional   OB History   Grav Para Term Preterm Abortions TAB SAB Ect Mult Living   0              Review of Systems  Constitutional: Negative for fever, chills, diaphoresis,  appetite change and fatigue.  HENT: Negative for sore throat, mouth sores, trouble swallowing and neck pain.   Eyes: Negative for visual disturbance.  Respiratory: Negative for cough, chest tightness, shortness of breath and wheezing.   Cardiovascular: Negative for chest pain.  Gastrointestinal: Negative for nausea, vomiting, abdominal pain, diarrhea and abdominal distention.  Endocrine: Negative for polydipsia, polyphagia and polyuria.  Genitourinary: Negative for dysuria, frequency and hematuria.  Musculoskeletal: Negative for back pain and gait problem.       Positive for jaw pain.  Skin: Negative for color change, pallor and rash.  Neurological: Positive for weakness and headaches. Negative for dizziness, syncope and light-headedness.  Hematological: Does not bruise/bleed easily.  Psychiatric/Behavioral: Negative for behavioral problems and confusion.    Allergies  Amoxicillin; Amoxicillin-pot clavulanate; Dilantin; and Penicillins  Home Medications   Current Outpatient Rx  Name  Route  Sig  Dispense  Refill  . Norgestimate-Ethinyl Estradiol Triphasic (ORTHO TRI-CYCLEN LO) 0.18/0.215/0.25 MG-25 MCG tablet   Oral   Take 1 tablet by mouth daily.           Marland Kitchen levETIRAcetam (KEPPRA XR) 500 MG 24 hr tablet   Oral   Take 1,000 mg by mouth at bedtime.          Marland Kitchen  levETIRAcetam (KEPPRA) 500 MG tablet   Oral   Take 2 tablets (1,000 mg total) by mouth daily.   60 tablet   0    Triage Vitals: BP 107/79  Pulse 88  Temp(Src) 98 F (36.7 C) (Oral)  Resp 18  Ht 5\' 3"  (1.6 m)  Wt 135 lb (61.236 kg)  BMI 23.92 kg/m2  SpO2 98%  LMP 11/02/2012  Physical Exam  Nursing note and vitals reviewed. Constitutional: She is oriented to person, place, and time. She appears well-developed and well-nourished. No distress.  HENT:  Head: Normocephalic.  No tongue lacerations or contusions.  Eyes: Conjunctivae and EOM are normal. Pupils are equal, round, and reactive to light. No scleral  icterus.  Pupils equal and reactive 4mm.  Neck: Normal range of motion. Neck supple. No thyromegaly present.  Cardiovascular: Normal rate and regular rhythm.  Exam reveals no gallop and no friction rub.   No murmur heard. Pulmonary/Chest: Effort normal and breath sounds normal. No respiratory distress. She has no wheezes. She has no rales.  Abdominal: Soft. Bowel sounds are normal. She exhibits no distension. There is no tenderness. There is no rebound.  Musculoskeletal: Normal range of motion.  Neurological: She is alert and oriented to person, place, and time.  Skin: Skin is warm and dry. No rash noted.  Psychiatric: She has a normal mood and affect. Her behavior is normal.    ED Course  Procedures (including critical care time)  DIAGNOSTIC STUDIES: Oxygen Saturation is 98% on room air, normal by my interpretation.    COORDINATION OF CARE: 8:09 PM -Will order Toradol and Keppra. Patient verbalizes understanding and agrees with treatment plan.    Labs Review Labs Reviewed - No data to display Imaging Review No results found.  MDM   1. Seizure    Patient is a diffuse Keppra. No abnormalities. No additional seizure activity. Discharged home given a prescription for Keppra. I have urged compliance.  I personally performed the services described in this documentation, which was scribed in my presence. The recorded information has been reviewed and is accurate.    Roney Marion, MD 11/22/12 2154

## 2012-11-22 NOTE — ED Notes (Signed)
Pt had a sz this pm. Lasting 1.5 min  In incontinence.  Headache.    Ran out medication 1 month ago.-keppra

## 2012-11-22 NOTE — ED Notes (Signed)
Pt reports focal seizure lasting about 1.5 minutes. Pt A&O upon arrival to ED. Pt states has not had medication for a month. Pt reports last seizure three months ago.

## 2012-11-22 NOTE — Discharge Instructions (Signed)
Epilepsy People with epilepsy have times when they shake and jerk uncontrollably (seizures). This happens when there is a sudden change in brain function. Epilepsy may have many possible causes. Anything that disturbs the normal pattern of brain cell activity can lead to seizures. HOME CARE   Listen to your doctor about driving and safety during normal activities.  Only take medicine as told by your doctor.  Take blood tests as told by your doctor.  Tell the people you live and work with that you have seizures. Make sure they know how to help you. They should:  Cushion your head and body.  Turn you on your side.  Not restrain you.  Not place anything inside your mouth.  Call for local emergency medical help if there is any question about what has happened.  Write down when your seizures happen and what you remember about each seizure. Write down anything you think may have caused the seizure to happen (trigger).  Keep all follow-up visits with your doctor. This is very important. GET HELP RIGHT AWAY IF:   You get an infection or start to feel sick. You may have more seizures when you are sick.  You are having seizures more often.  Your seizure pattern is changing.  A seizure does not stop after a few seconds or minutes.  A seizure causes you to have trouble breathing.  A seizure gives you a very bad headache.  A seizure makes you unable to speak or use a part of your body. MAKE SURE YOU:   Understand these instructions.  Will watch your condition.  Will get help right away if you are not doing well or get worse. Document Released: 12/07/2008 Document Revised: 05/04/2011 Document Reviewed: 12/07/2008 [facility] Patient Information 2014 [place], [place].

## 2013-02-28 ENCOUNTER — Encounter (HOSPITAL_COMMUNITY): Payer: Self-pay | Admitting: Emergency Medicine

## 2013-02-28 ENCOUNTER — Emergency Department (HOSPITAL_COMMUNITY)
Admission: EM | Admit: 2013-02-28 | Discharge: 2013-02-28 | Disposition: A | Discharge status: Home / Self Care | Payer: Medicaid Other | Attending: Emergency Medicine | Admitting: Emergency Medicine

## 2013-02-28 DIAGNOSIS — H6691 Otitis media, unspecified, right ear: Secondary | ICD-10-CM

## 2013-02-28 DIAGNOSIS — G40909 Epilepsy, unspecified, not intractable, without status epilepticus: Secondary | ICD-10-CM | POA: Insufficient documentation

## 2013-02-28 DIAGNOSIS — F17200 Nicotine dependence, unspecified, uncomplicated: Secondary | ICD-10-CM | POA: Insufficient documentation

## 2013-02-28 DIAGNOSIS — Z79899 Other long term (current) drug therapy: Secondary | ICD-10-CM | POA: Insufficient documentation

## 2013-02-28 DIAGNOSIS — R6889 Other general symptoms and signs: Secondary | ICD-10-CM

## 2013-02-28 DIAGNOSIS — G8929 Other chronic pain: Secondary | ICD-10-CM | POA: Insufficient documentation

## 2013-02-28 DIAGNOSIS — IMO0001 Reserved for inherently not codable concepts without codable children: Secondary | ICD-10-CM | POA: Insufficient documentation

## 2013-02-28 DIAGNOSIS — J111 Influenza due to unidentified influenza virus with other respiratory manifestations: Secondary | ICD-10-CM | POA: Insufficient documentation

## 2013-02-28 DIAGNOSIS — Z880 Allergy status to penicillin: Secondary | ICD-10-CM | POA: Insufficient documentation

## 2013-02-28 DIAGNOSIS — H6690 Otitis media, unspecified, unspecified ear: Secondary | ICD-10-CM | POA: Insufficient documentation

## 2013-02-28 MED ORDER — AZITHROMYCIN 250 MG PO TABS
500.0000 mg | ORAL_TABLET | Freq: Once | ORAL | Status: AC
  Administered 2013-02-28: 500 mg via ORAL
  Filled 2013-02-28: qty 2

## 2013-02-28 MED ORDER — AZITHROMYCIN 250 MG PO TABS: ORAL_TABLET | ORAL | Status: DC

## 2013-02-28 MED ORDER — ANTIPYRINE-BENZOCAINE 5.4-1.4 % OT SOLN
3.0000 [drp] | Freq: Once | OTIC | Status: AC
  Administered 2013-02-28: 3 [drp] via OTIC
  Filled 2013-02-28: qty 10

## 2013-02-28 MED ORDER — TRAMADOL HCL 50 MG PO TABS
50.0000 mg | ORAL_TABLET | Freq: Four times a day (QID) | ORAL | Status: DC | PRN
Start: 2013-02-28 — End: 2013-04-07

## 2013-02-28 MED ORDER — IBUPROFEN 600 MG PO TABS: 600.0000 mg | ORAL_TABLET | Freq: Four times a day (QID) | ORAL | Status: DC | PRN

## 2013-02-28 NOTE — ED Notes (Signed)
Pt reports flu like symptoms x4 days, bil earaches that started today.

## 2013-02-28 NOTE — ED Provider Notes (Signed)
Medical screening examination/treatment/procedure(s) were performed by non-physician practitioner and as supervising physician I was immediately available for consultation/collaboration.  EKG Interpretation   None         Aslan Himes W. Washington Whedbee, MD 02/28/13 2118 

## 2013-02-28 NOTE — ED Provider Notes (Signed)
CSN: [id number]     Arrival date & time 02/28/13  1812 History   First MD Initiated Contact with Patient 02/28/13 1857     Chief Complaint  Patient presents with  . Otalgia  . Influenza   (Consider location/radiation/quality/duration/timing/severity/associated sxs/prior Treatment) HPI Comments: Alvena Kiernan Long is a 23 y.o. Female presenting with4 day history of uri type symptoms which is as a mild sore throat which became severe over the first 24 hours, now improved, but has progressed to include nasal congestion with clear rhinorrhea and now severe right ear pain.  She also describes generalized myalgias, subjective fevers and chills.  Symptoms due to not include shortness of breath, chest pain,  Nausea, vomiting or diarrhea.  The patient has taken tylenol prior to arrival with no significant improvement in symptoms.       The history is provided by the patient.    Past Medical History  Diagnosis Date  . Seizures   . Chronic knee pain    History reviewed. No pertinent past surgical history. Family History  Problem Relation Age of Onset  . Diabetes Father    History  Substance Use Topics  . Smoking status: Current Every Day Smoker -- 0.50 packs/day    Types: Cigarettes  . Smokeless tobacco: Not on file  . Alcohol Use: Yes     Comment: occasional   OB History   Grav Para Term Preterm Abortions TAB SAB Ect Mult Living   0              Review of Systems  Constitutional: Positive for fever and chills.  HENT: Positive for congestion, ear pain, hearing loss, rhinorrhea and sore throat. Negative for facial swelling, sinus pressure, trouble swallowing and voice change.   Eyes: Negative for discharge.  Respiratory: Positive for cough. Negative for shortness of breath, wheezing and stridor.   Cardiovascular: Negative for chest pain.  Gastrointestinal: Negative for abdominal pain.  Genitourinary: Negative.     Allergies  Amoxicillin; Amoxicillin-pot clavulanate; Dilantin; and  Penicillins  Home Medications   Current Outpatient Rx  Name  Route  Sig  Dispense  Refill  . azithromycin (ZITHROMAX Z-PAK) 250 MG tablet      1 tab po daily for 4 additional days,  Starting 03/01/13   4 each   0   . ibuprofen (ADVIL,MOTRIN) 600 MG tablet   Oral   Take 1 tablet (600 mg total) by mouth every 6 (six) hours as needed.   30 tablet   0   . levETIRAcetam (KEPPRA XR) 500 MG 24 hr tablet   Oral   Take 1,000 mg by mouth at bedtime.          . levETIRAcetam (KEPPRA) 500 MG tablet   Oral   Take 2 tablets (1,000 mg total) by mouth daily.   60 tablet   0   . Norgestimate-Ethinyl Estradiol Triphasic (ORTHO TRI-CYCLEN LO) 0.18/0.215/0.25 MG-25 MCG tablet   Oral   Take 1 tablet by mouth daily.           . traMADol (ULTRAM) 50 MG tablet   Oral   Take 1 tablet (50 mg total) by mouth every 6 (six) hours as needed.   15 tablet   0    BP 118/90  Pulse 80  Temp(Src) 98.6 F (37 C) (Oral)  Resp 20  Ht 5\' 3"  (1.6 m)  Wt 132 lb (59.875 kg)  BMI 23.39 kg/m2  SpO2 100%  LMP 02/27/2013 Physical Exam  Constitutional: She  is oriented to person, place, and time. She appears well-developed and well-nourished.  HENT:  Head: Normocephalic and atraumatic.  Right Ear: Ear canal normal. Tympanic membrane is erythematous and bulging.  Left Ear: Tympanic membrane and ear canal normal.  Nose: Mucosal edema and rhinorrhea present.  Mouth/Throat: Uvula is midline and mucous membranes are normal. Posterior oropharyngeal erythema present. No oropharyngeal exudate, posterior oropharyngeal edema or tonsillar abscesses.  Eyes: Conjunctivae are normal.  Cardiovascular: Normal rate and normal heart sounds.   Pulmonary/Chest: Effort normal. No respiratory distress. She has no wheezes. She has no rales.  Abdominal: Soft. There is no tenderness.  Musculoskeletal: Normal range of motion.  Neurological: She is alert and oriented to person, place, and time.  Skin: Skin is warm and dry. No  rash noted.  Psychiatric: She has a normal mood and affect.    ED Course  Procedures (including critical care time) Labs Review Labs Reviewed - No data to display Imaging Review No results found.  EKG Interpretation   None       MDM   1. Otitis media, right   2. Flu-like symptoms    Patient was prescribed Zithromax with first dose given here.  She was also prescribed ibuprofen 600 mg and Ultram.  She was given Auralgan drops, first dose given here with some improvement in her ear pain.  She will disease at home every 4 hours when necessary pain.  Rest, increase fluid intake.  Recheck for any worsened symptoms.  The patient appears reasonably screened and/or stabilized for discharge and I doubt any other medical condition or other [facility] requiring further screening, evaluation, or treatment in the ED at this time prior to discharge.     Burgess AmorJulie Zea Kostka, PA-C 02/28/13 1952

## 2013-02-28 NOTE — Discharge Instructions (Signed)
Fever, Adult A fever is a temperature of 100.4 F (38 C) or above.  HOME CARE  Take fever medicine as told by your doctor. Do not  take aspirin for fever if you are younger than 24 years of age.  If you are given antibiotic medicine, take it as told. Finish the medicine even if you start to feel better.  Rest.  Drink enough fluids to keep your pee (urine) clear or pale yellow. Do not drink alcohol.  Take a bath or shower with room temperature water. Do not use ice water or alcohol sponge baths.  Wear lightweight, loose clothes. GET HELP RIGHT AWAY IF:   You are short of breath or have trouble breathing.  You are very weak.  You are dizzy or you pass out (faint).  You are very thirsty or are making little or no urine.  You have new pain.  You throw up (vomit) or have watery poop (diarrhea).  You keep throwing up or having watery poop for more than 1 to 2 days.  You have a stiff neck or light bothers your eyes.  You have a skin rash.  You have a fever or problems (symptoms) that last for more than 2 to 3 days.  You have a fever and your problems quickly get worse.  You keep throwing up the fluids you drink.  You do not feel better after 3 days.  You have new problems. MAKE SURE YOU:   Understand these instructions.  Will watch your condition.  Will get help right away if you are not doing well or get worse. Document Released: 11/19/2007 Document Revised: 05/04/2011 Document Reviewed: 12/11/2010 [facility] Patient Information 2014 [place], [place].  Otitis Media, Adult Otitis media is redness, soreness, and puffiness (swelling) in the space just behind your eardrum (middle ear). It may be caused by allergies or infection. It often happens along with a cold. HOME CARE  Take your medicine as told. Finish it even if you start to feel better.  Only take over-the-counter or prescription medicines for pain, discomfort, or fever as told by your doctor.  Follow up  with your doctor as told. GET HELP IF:  You have otitis media only in one ear or bleeding from your nose or both.  You notice a lump on your neck.  You are not getting better in 3 5 days.  You feel worse instead of better. GET HELP RIGHT AWAY IF:   You have pain that is not helped with medicine.  You have puffiness, redness, or pain around your ear.  You get a stiff neck.  You cannot move part of your face (paralysis).  You notice that the bone behind your ear hurts when you touch it. MAKE SURE YOU:   Understand these instructions.  Will watch your condition.  Will get help right away if you are not doing well or get worse. Document Released: 07/29/2007 Document Revised: 10/12/2012 Document Reviewed: 09/06/2012 [facility] Patient Information 2014 [place], [place].

## 2013-02-28 NOTE — ED Notes (Signed)
X 4 days pt with fever, cough and bilateral earaches, denies N/V/D

## 2013-03-22 ENCOUNTER — Encounter (HOSPITAL_COMMUNITY): Payer: Self-pay | Admitting: Emergency Medicine

## 2013-03-22 ENCOUNTER — Emergency Department (HOSPITAL_COMMUNITY)
Admission: EM | Admit: 2013-03-22 | Discharge: 2013-03-22 | Disposition: A | Discharge status: Home / Self Care | Payer: Medicaid Other | Attending: Emergency Medicine | Admitting: Emergency Medicine

## 2013-03-22 DIAGNOSIS — Z79899 Other long term (current) drug therapy: Secondary | ICD-10-CM | POA: Insufficient documentation

## 2013-03-22 DIAGNOSIS — F17200 Nicotine dependence, unspecified, uncomplicated: Secondary | ICD-10-CM | POA: Insufficient documentation

## 2013-03-22 DIAGNOSIS — Z880 Allergy status to penicillin: Secondary | ICD-10-CM | POA: Insufficient documentation

## 2013-03-22 DIAGNOSIS — R6884 Jaw pain: Secondary | ICD-10-CM | POA: Insufficient documentation

## 2013-03-22 DIAGNOSIS — Z792 Long term (current) use of antibiotics: Secondary | ICD-10-CM | POA: Insufficient documentation

## 2013-03-22 DIAGNOSIS — G40909 Epilepsy, unspecified, not intractable, without status epilepticus: Secondary | ICD-10-CM | POA: Insufficient documentation

## 2013-03-22 DIAGNOSIS — R569 Unspecified convulsions: Secondary | ICD-10-CM

## 2013-03-22 DIAGNOSIS — G8929 Other chronic pain: Secondary | ICD-10-CM | POA: Insufficient documentation

## 2013-03-22 MED ORDER — KETOROLAC TROMETHAMINE 30 MG/ML IJ SOLN
30.0000 mg | Freq: Once | INTRAMUSCULAR | Status: AC
  Administered 2013-03-22: 30 mg via INTRAVENOUS
  Filled 2013-03-22: qty 1

## 2013-03-22 MED ORDER — LEVETIRACETAM ER 500 MG PO TB24: 500.0000 mg | ORAL_TABLET | Freq: Every day | ORAL | Status: DC

## 2013-03-22 MED ORDER — LEVETIRACETAM 500 MG/5ML IV SOLN
INTRAVENOUS | Status: AC
  Filled 2013-03-22: qty 10

## 2013-03-22 MED ORDER — SODIUM CHLORIDE 0.9 % IV SOLN
Freq: Once | INTRAVENOUS | Status: AC
  Administered 2013-03-22: 20 mL/h via INTRAVENOUS

## 2013-03-22 MED ORDER — SODIUM CHLORIDE 0.9 % IV SOLN
1000.0000 mg | Freq: Once | INTRAVENOUS | Status: AC
  Administered 2013-03-22: 1000 mg via INTRAVENOUS
  Filled 2013-03-22: qty 10

## 2013-03-22 MED ORDER — NAPROXEN 500 MG PO TABS: 500.0000 mg | ORAL_TABLET | Freq: Two times a day (BID) | ORAL | Status: DC

## 2013-03-22 NOTE — ED Notes (Signed)
Boyfriend reports patient told him she was having an aura - describes a general "jerkling arms and stiff body for 30 seconds to 1 minute followed by about 10 minutes of "not talking just staring".  Out of seizure medicine for past two weeks - has not had funds to purchase or go see a doctor. Denies incontinence.  Now alert and oriented to date, year, President of [place] and location.

## 2013-03-22 NOTE — Discharge Instructions (Signed)
Please call your doctor for a followup appointment within 24-48 hours. When you talk to your doctor please let them know that you were seen in the emergency department and have them acquire all of your records so that they can discuss the findings with you and formulate a treatment plan to fully care for your new and ongoing problems. ° °[facility] Doctor List ° ° ° °Edward Hawkins MD. Specialty: Pulmonary Disease Contact information: [address]  °[address]  °[place] [address]  °[phone]  ° °Margaret Simpson, MD. Specialty: Family Medicine Contact information: [address]  °[place] [address]  °[phone]  ° °Scott Luking, MD. Specialty: Family Medicine Contact information: [address]  °[address]  °[place] [address]  °[phone]  ° °Tesfaye Fanta, MD Specialty: Internal Medicine Contact information: [address]  °[place] [address]  °[phone]  ° °Zach Hall, MD. Specialty: Internal Medicine Contact information: [address]  °[place] [address]  °[phone]  ° °Angus Mcinnis, MD. Specialty: Family Medicine Contact information: [address]  °[place] [address]  °[phone]  ° °Stephen Knowlton, MD. Specialty: Family Medicine Contact information: [address]  °[address]  °[place] [address]  °[phone]  ° °Roy Fagan, MD. Specialty: Internal Medicine Contact information: [address]  °[address]  °[place]  [address]  °[phone]  ° ° °

## 2013-03-22 NOTE — ED Notes (Signed)
AC preparing Keppra IV medication

## 2013-03-22 NOTE — ED Provider Notes (Signed)
CSN: [id number]     Arrival date & time 03/22/13  4098 History   First MD Initiated Contact with Patient 03/22/13 [phone]     Chief Complaint  Patient presents with  . Seizures   (Consider location/radiation/quality/duration/timing/severity/associated sxs/prior Treatment) HPI Comments: The pt is a 23 y/o female with hx of seizure d/o - has been out of her seizure meds (Keppra) for 2 weeks and had seizure like activity this AM.  According to BF, pt told him that she was having an aura, followed by jerking of arms and stiffness - this lasted less than on minute and had a post ictal state of decreased level of alertness.  Sx spontaneously improved and pt is back to baselin MS on arrival.  No fevers, chills, nasuea, vomiting, sob, cough, rash or other c/o.  Instead of clenching teeth, she had opened her mouth very wide and now has L jaw pain  Patient is a 23 y.o. female presenting with seizures. The history is provided by the patient and a friend.  Seizures   Past Medical History  Diagnosis Date  . Seizures   . Chronic knee pain    History reviewed. No pertinent past surgical history. Family History  Problem Relation Age of Onset  . Diabetes Father    History  Substance Use Topics  . Smoking status: Current Every Day Smoker -- 0.50 packs/day    Types: Cigarettes  . Smokeless tobacco: Not on file  . Alcohol Use: Yes     Comment: occasional   OB History   Grav Para Term Preterm Abortions TAB SAB Ect Mult Living   0              Review of Systems  Neurological: Positive for seizures.  All other systems reviewed and are negative.    Allergies  Amoxicillin; Amoxicillin-pot clavulanate; Dilantin; and Penicillins  Home Medications   Current Outpatient Rx  Name  Route  Sig  Dispense  Refill  . levETIRAcetam (KEPPRA XR) 500 MG 24 hr tablet   Oral   Take 1,000 mg by mouth at bedtime.          . levETIRAcetam (KEPPRA) 500 MG tablet   Oral   Take 2 tablets (1,000 mg total) by  mouth daily.   60 tablet   0   . Norgestimate-Ethinyl Estradiol Triphasic (ORTHO TRI-CYCLEN LO) 0.18/0.215/0.25 MG-25 MCG tablet   Oral   Take 1 tablet by mouth daily.           Marland Kitchen azithromycin (ZITHROMAX Z-PAK) 250 MG tablet      1 tab po daily for 4 additional days,  Starting 03/01/13   4 each   0   . ibuprofen (ADVIL,MOTRIN) 600 MG tablet   Oral   Take 1 tablet (600 mg total) by mouth every 6 (six) hours as needed.   30 tablet   0   . levETIRAcetam (KEPPRA XR) 500 MG 24 hr tablet   Oral   Take 1 tablet (500 mg total) by mouth daily.   60 tablet   6   . naproxen (NAPROSYN) 500 MG tablet   Oral   Take 1 tablet (500 mg total) by mouth 2 (two) times daily with a meal.   30 tablet   0   . traMADol (ULTRAM) 50 MG tablet   Oral   Take 1 tablet (50 mg total) by mouth every 6 (six) hours as needed.   15 tablet   0    BP 121/86  Pulse 85  Temp(Src) 98.5 F (36.9 C) (Oral)  Resp 18  Ht 5\' 3"  (1.6 m)  Wt 132 lb (59.875 kg)  BMI 23.39 kg/m2  SpO2 100%  LMP 02/27/2013 Physical Exam  Nursing note and vitals reviewed. Constitutional: She appears well-developed and well-nourished. No distress.  HENT:  Head: Normocephalic and atraumatic.  Mouth/Throat: Oropharynx is clear and moist. No oropharyngeal exudate.  Pain with opening mouth over teh L jaw  Eyes: Conjunctivae and EOM are normal. Pupils are equal, round, and reactive to light. Right eye exhibits no discharge. Left eye exhibits no discharge. No scleral icterus.  Neck: Normal range of motion. Neck supple. No JVD present. No thyromegaly present.  Cardiovascular: Normal rate, regular rhythm, normal heart sounds and intact distal pulses.  Exam reveals no gallop and no friction rub.   No murmur heard. Pulmonary/Chest: Effort normal and breath sounds normal. No respiratory distress. She has no wheezes. She has no rales.  Abdominal: Soft. Bowel sounds are normal. She exhibits no distension and no mass. There is no  tenderness.  Musculoskeletal: Normal range of motion. She exhibits no edema and no tenderness.  Lymphadenopathy:    She has no cervical adenopathy.  Neurological: She is alert. Coordination normal.  Neurologic exam:  Speech clear, pupils equal round reactive to light, extraocular movements intact  Normal peripheral visual fields Cranial nerves III through XII normal including no facial droop Follows commands, moves all extremities x4, normal strength to bilateral upper and lower extremities at all major muscle groups including grip Sensation normal to light touch and pinprick Coordination intact, no limb ataxia,    Skin: Skin is warm and dry. No rash noted. No erythema.  Psychiatric: She has a normal mood and affect. Her behavior is normal.    ED Course  Procedures (including critical care time) Labs Review Labs Reviewed - No data to display Imaging Review No results found.  EKG Interpretation   None       MDM   1. Seizure    Recurrent seizure - will give Keppra - refill meds - she has no new or concerning features to her seizure.  VS normal.  IV keppra given prior to d/c  No further seizures seen.  toradol for jaw strain.  No further seizures in the ED - Keppra given, stable for d/c.  Meds given in ED:  Medications  levETIRAcetam (KEPPRA) 1,000 mg in sodium chloride 0.9 % 100 mL IVPB (0 mg Intravenous Stopped 03/22/13 0626)  0.9 %  sodium chloride infusion (20 mL/hr Intravenous New Bag/Given 03/22/13 0602)  ketorolac (TORADOL) 30 MG/ML injection 30 mg (30 mg Intravenous Given 03/22/13 0600)    New Prescriptions   LEVETIRACETAM (KEPPRA XR) 500 MG 24 HR TABLET    Take 1 tablet (500 mg total) by mouth daily.   NAPROXEN (NAPROSYN) 500 MG TABLET    Take 1 tablet (500 mg total) by mouth 2 (two) times daily with a meal.      Vida RollerBrian D Yetzali Weld, MD 03/22/13 0630

## 2013-04-07 ENCOUNTER — Emergency Department (HOSPITAL_COMMUNITY)
Admission: EM | Admit: 2013-04-07 | Discharge: 2013-04-07 | Disposition: A | Discharge status: Home / Self Care | Payer: Medicaid Other | Attending: Emergency Medicine | Admitting: Emergency Medicine

## 2013-04-07 ENCOUNTER — Encounter (HOSPITAL_COMMUNITY): Payer: Self-pay | Admitting: Emergency Medicine

## 2013-04-07 DIAGNOSIS — F17200 Nicotine dependence, unspecified, uncomplicated: Secondary | ICD-10-CM | POA: Insufficient documentation

## 2013-04-07 DIAGNOSIS — Z880 Allergy status to penicillin: Secondary | ICD-10-CM | POA: Insufficient documentation

## 2013-04-07 DIAGNOSIS — Z79899 Other long term (current) drug therapy: Secondary | ICD-10-CM | POA: Insufficient documentation

## 2013-04-07 DIAGNOSIS — Z760 Encounter for issue of repeat prescription: Secondary | ICD-10-CM | POA: Insufficient documentation

## 2013-04-07 DIAGNOSIS — G40909 Epilepsy, unspecified, not intractable, without status epilepticus: Secondary | ICD-10-CM | POA: Insufficient documentation

## 2013-04-07 DIAGNOSIS — G8929 Other chronic pain: Secondary | ICD-10-CM | POA: Insufficient documentation

## 2013-04-07 MED ORDER — LEVETIRACETAM 500 MG PO TABS
500.0000 mg | ORAL_TABLET | Freq: Once | ORAL | Status: AC
  Administered 2013-04-07: 500 mg via ORAL
  Filled 2013-04-07: qty 1

## 2013-04-07 MED ORDER — LEVETIRACETAM 500 MG PO TABS
ORAL_TABLET | ORAL | Status: AC
  Filled 2013-04-07: qty 1

## 2013-04-07 MED ORDER — LEVETIRACETAM ER 500 MG PO TB24: 1000.0000 mg | ORAL_TABLET | Freq: Every day | ORAL | Status: DC

## 2013-04-07 MED ORDER — LEVETIRACETAM ER 500 MG PO TB24
1000.0000 mg | ORAL_TABLET | Freq: Every day | ORAL | Status: DC
  Filled 2013-04-07: qty 2

## 2013-04-07 NOTE — ED Notes (Signed)
Refill needed for Keppra.  [facility]  Was seen in Sept[facility] Here and given Rx, but had no money to get it filled at that time.

## 2013-04-07 NOTE — Discharge Instructions (Signed)
Medication Refill, Emergency Department  We have refilled your medication today as a courtesy to you. It is best for your medical care, however, to take care of getting refills done through your primary caregiver's office. They have your records and can do a better job of follow-up than we can in the emergency department.  On maintenance medications, we often only prescribe enough medications to get you by until you are able to see your regular caregiver. This is a more expensive way to refill medications.  In the future, please plan for refills so that you will not have to use the emergency department for this.  Thank you for your help. Your help allows us to better take care of the daily emergencies that enter our department.  Document Released: 05/29/2003 Document Revised: 05/04/2011 Document Reviewed: 02/09/2005  ExitCare® Patient Information ©2014 [employer].

## 2013-04-07 NOTE — ED Notes (Signed)
Pt here to get refill of Keppra  Alert   Nad,

## 2013-04-09 NOTE — ED Provider Notes (Signed)
Medical screening examination/treatment/procedure(s) were performed by non-physician practitioner and as supervising physician I was immediately available for consultation/collaboration.  Chaelyn Bunyan, MD 04/09/13 1521 

## 2013-04-09 NOTE — ED Provider Notes (Signed)
CSN: [id number]     Arrival date & time 04/07/13  2141 History   First MD Initiated Contact with Patient 04/07/13 2205     Chief Complaint  Patient presents with  . Medication Refill     (Consider location/radiation/quality/duration/timing/severity/associated sxs/prior Treatment) HPI Comments: Patricia Irwin is a 23 y.o. female who presents to the Emergency Department requesting refill of her Keppra XR.  She states that on her previous ED visit, she was given a prescription for Keppra , but did not have money to pay for it.  She state the prescription was submitted to a mail in company that she is waiting for approval for financial assistance.  She states she has not had her medication since her previous ED visit on 03/22/13.  She denies any pain or symptoms at this time.  The history is provided by the patient.    Past Medical History  Diagnosis Date  . Seizures   . Chronic knee pain    History reviewed. No pertinent past surgical history. Family History  Problem Relation Age of Onset  . Diabetes Father    History  Substance Use Topics  . Smoking status: Current Every Day Smoker -- 0.50 packs/day    Types: Cigarettes  . Smokeless tobacco: Not on file  . Alcohol Use: Yes     Comment: occasional   OB History   Grav Para Term Preterm Abortions TAB SAB Ect Mult Living   0              Review of Systems  Constitutional: Negative for fever and chills.  Respiratory: Negative for shortness of breath.   Cardiovascular: Negative for chest pain and palpitations.  Gastrointestinal: Negative for nausea, vomiting and abdominal pain.  Musculoskeletal: Negative for arthralgias, myalgias, neck pain and neck stiffness.  Skin: Negative for rash.  Neurological: Negative for dizziness, seizures, syncope, speech difficulty, weakness, light-headedness, numbness and headaches.  Hematological: Does not bruise/bleed easily.  All other systems reviewed and are negative.      Allergies   Amoxicillin; Amoxicillin-pot clavulanate; Dilantin; and Penicillins  Home Medications   Current Outpatient Rx  Name  Route  Sig  Dispense  Refill  . levETIRAcetam (KEPPRA XR) 500 MG 24 hr tablet   Oral   Take 1,000 mg by mouth at bedtime.          . Norgestimate-Ethinyl Estradiol Triphasic (ORTHO TRI-CYCLEN LO) 0.18/0.215/0.25 MG-25 MCG tablet   Oral   Take 1 tablet by mouth daily.           Marland Kitchen. levETIRAcetam (KEPPRA XR) 500 MG 24 hr tablet   Oral   Take 2 tablets (1,000 mg total) by mouth daily.   60 tablet   0    BP 129/87  Pulse 91  Temp(Src) 97.8 F (36.6 C) (Oral)  Resp 16  Ht 5\' 3"  (1.6 m)  Wt 135 lb (61.236 kg)  BMI 23.92 kg/m2  SpO2 100%  LMP 03/31/2013 Physical Exam  Nursing note and vitals reviewed. Constitutional: She is oriented to person, place, and time. She appears well-developed and well-nourished. No distress.  HENT:  Head: Normocephalic and atraumatic.  Mouth/Throat: Oropharynx is clear and moist.  Eyes: Conjunctivae and EOM are normal. Pupils are equal, round, and reactive to light.  Neck: Normal range of motion. Neck supple.  Cardiovascular: Normal rate, regular rhythm, normal heart sounds and intact distal pulses.   No murmur heard. Pulmonary/Chest: Effort normal and breath sounds normal. No respiratory distress.  Musculoskeletal: Normal range  of motion.  Neurological: She is alert and oriented to person, place, and time. She exhibits normal muscle tone. Coordination normal.  Psychiatric: She has a normal mood and affect. Her behavior is normal. Thought content normal.    ED Course  Procedures (including critical care time) Labs Review Labs Reviewed - No data to display Imaging Review No results found.  EKG Interpretation   None       MDM   Final diagnoses:  Medication refill    Previous ED chart reviewed  Patient was given #6 refills of her Keppra XR from her previous visit, but she states that she has submitted the  prescription to a mail in drug company and is waiting for financial assistance.    Patient is well appearing, VSS.  No hx of recent seizure activity.  I has advised pt that she will need further management of her prescriptions with a PCP and that ED is not proper location for medication refills.  She understands that I will prescribe enough to allow for delivery of her mail in prescription as a courtesy.   The patient appears reasonably screened and/or stabilized for discharge and I doubt any other medical condition or other [facility] requiring further screening, evaluation, or treatment in the ED at this time prior to discharge.     Samit Sylve L. Zak Gondek, PA-C 04/09/13 1233

## 2013-09-23 ENCOUNTER — Emergency Department (HOSPITAL_COMMUNITY)
Admission: EM | Admit: 2013-09-23 | Discharge: 2013-09-23 | Disposition: A | Discharge status: Home / Self Care | Payer: Medicaid Other | Attending: Emergency Medicine | Admitting: Emergency Medicine

## 2013-09-23 ENCOUNTER — Encounter (HOSPITAL_COMMUNITY): Payer: Self-pay | Admitting: Emergency Medicine

## 2013-09-23 DIAGNOSIS — G40909 Epilepsy, unspecified, not intractable, without status epilepticus: Secondary | ICD-10-CM | POA: Insufficient documentation

## 2013-09-23 DIAGNOSIS — Y9389 Activity, other specified: Secondary | ICD-10-CM | POA: Insufficient documentation

## 2013-09-23 DIAGNOSIS — G8929 Other chronic pain: Secondary | ICD-10-CM | POA: Insufficient documentation

## 2013-09-23 DIAGNOSIS — IMO0002 Reserved for concepts with insufficient information to code with codable children: Secondary | ICD-10-CM | POA: Insufficient documentation

## 2013-09-23 DIAGNOSIS — Z880 Allergy status to penicillin: Secondary | ICD-10-CM | POA: Insufficient documentation

## 2013-09-23 DIAGNOSIS — Y9289 Other specified places as the place of occurrence of the external cause: Secondary | ICD-10-CM | POA: Insufficient documentation

## 2013-09-23 DIAGNOSIS — T63301A Toxic effect of unspecified spider venom, accidental (unintentional), initial encounter: Secondary | ICD-10-CM

## 2013-09-23 DIAGNOSIS — F17200 Nicotine dependence, unspecified, uncomplicated: Secondary | ICD-10-CM | POA: Insufficient documentation

## 2013-09-23 DIAGNOSIS — Z79899 Other long term (current) drug therapy: Secondary | ICD-10-CM | POA: Insufficient documentation

## 2013-09-23 DIAGNOSIS — R110 Nausea: Secondary | ICD-10-CM | POA: Insufficient documentation

## 2013-09-23 LAB — COMPREHENSIVE METABOLIC PANEL
ALT: 12 U/L (ref 0–35)
AST: 15 U/L (ref 0–37)
Albumin: 3.8 g/dL (ref 3.5–5.2)
Alkaline Phosphatase: 74 U/L (ref 39–117)
Anion gap: 12 (ref 5–15)
BUN: 7 mg/dL (ref 6–23)
CALCIUM: 9 mg/dL (ref 8.4–10.5)
CO2: 24 mEq/L (ref 19–32)
CREATININE: 0.56 mg/dL (ref 0.50–1.10)
Chloride: 102 mEq/L (ref 96–112)
GFR calc Af Amer: >90 mL/min (ref >90)
GFR calc non Af Amer: >90 mL/min (ref >90)
Glucose, Bld: 91 mg/dL (ref 70–99)
Potassium: 3.6 mEq/L — ABNORMAL LOW (ref 3.7–5.3)
SODIUM: 138 meq/L (ref 137–147)
TOTAL PROTEIN: 7.4 g/dL (ref 6.0–8.3)
Total Bilirubin: 0.2 mg/dL — ABNORMAL LOW (ref 0.3–1.2)

## 2013-09-23 LAB — CBC
HCT: 40.7 % (ref 36.0–46.0)
Hemoglobin: 14.2 g/dL (ref 12.0–15.0)
MCH: 30.1 pg (ref 26.0–34.0)
MCHC: 34.9 g/dL (ref 30.0–36.0)
MCV: 86.2 fL (ref 78.0–100.0)
PLATELETS: 345 10*3/uL (ref 150–400)
RBC: 4.72 MIL/uL (ref 3.87–5.11)
RDW: 13.1 % (ref 11.5–15.5)
WBC: 12 10*3/uL — ABNORMAL HIGH (ref 4.0–10.5)

## 2013-09-23 MED ORDER — MORPHINE SULFATE 4 MG/ML IJ SOLN
4.0000 mg | Freq: Once | INTRAMUSCULAR | Status: AC
  Administered 2013-09-23: 4 mg via INTRAVENOUS
  Filled 2013-09-23: qty 1

## 2013-09-23 MED ORDER — KETOROLAC TROMETHAMINE 30 MG/ML IJ SOLN
30.0000 mg | Freq: Once | INTRAMUSCULAR | Status: AC
  Administered 2013-09-23: 30 mg via INTRAVENOUS
  Filled 2013-09-23: qty 1

## 2013-09-23 MED ORDER — OXYCODONE-ACETAMINOPHEN 5-325 MG PO TABS: 1.0000 | ORAL_TABLET | ORAL | Status: DC | PRN

## 2013-09-23 NOTE — ED Notes (Signed)
Pt was bit by a spider about ago.  Spider is black with red markings (black widow?) and pt has spider with her.  Pt has pain, swelling and redness to left middle toe.  Pt is anxious

## 2013-09-23 NOTE — Discharge Instructions (Signed)
Take acetaminophen, ibuprofen, or naproxen as needed for less severe pain.  Spider Bite Spider bites are not common. Most spider bites do not cause serious problems. The elderly, very young children, and people with certain existing medical conditions are more likely to experience significant symptoms. SYMPTOMS  Spider bites may not cause any pain at first. Within 1 or 2 days of the bite, there may be swelling, redness, and pain in the bite area. However, some spider bites can cause pain within the first hour. TREATMENT  Your caregiver may prescribe antibiotic medicine if a bacterial infection develops in the bite. However, not all spider bites require antibiotics or prescription medicines.  HOME CARE INSTRUCTIONS  Do not scratch the bite area.  Keep the bite area clean and dry. Wash the area with soap and water as directed.  Put ice or cool compresses on the bite area.  Put ice in a plastic bag.  Place a towel between your skin and the bag.  Leave the ice on for 20 minutes, 4 times a day for the first 2 to 3 days, or as directed.  Keep the bite area elevated above the level of your heart. This helps reduce redness and swelling.  Only take over-the-counter or prescription medicines as directed by your caregiver.  If you are given antibiotics, take them as directed. Finish them even if you start to feel better. You may need a tetanus shot if:  You cannot remember when you had your last tetanus shot.  You have never had a tetanus shot.  The injury broke your skin. If you get a tetanus shot, your arm may swell, get red, and feel warm to the touch. This is common and not a problem. If you need a tetanus shot and you choose not to have one, there is a rare chance of getting tetanus. Sickness from tetanus can be serious. SEEK MEDICAL CARE IF: Your bite is not better after 3 days of treatment. SEEK IMMEDIATE MEDICAL CARE IF:  Your bite turns purple or develops increased swelling,  pain, or redness.  You develop shortness of breath or chest pain.  You have muscle cramps or painful muscle spasms.  You develop abdominal pain, nausea, or vomiting.  You feel unusually tired or sleepy. MAKE SURE YOU:  Understand these instructions.  Will watch your condition.  Will get help right away if you are not doing well or get worse. Document Released: 03/19/2004 Document Revised: 05/04/2011 Document Reviewed: 09/10/2010 [facility] Patient Information 2015 [employer], [place]. This information is not intended to replace advice given to you by your health care provider. Make sure you discuss any questions you have with your health care provider.  Acetaminophen; Oxycodone tablets What is this medicine? ACETAMINOPHEN; OXYCODONE (a set a MEE noe fen; ox i KOE done) is a pain reliever. It is used to treat mild to moderate pain. This medicine may be used for other purposes; ask your health care provider or pharmacist if you have questions. COMMON BRAND NAME(S): Endocet, Magnacet, Narvox, Percocet, Perloxx, Primalev, Primlev, Roxicet, Xolox What should I tell my health care provider before I take this medicine? They need to know if you have any of these conditions: -brain tumor -Crohn's disease, inflammatory bowel disease, or ulcerative colitis -drug abuse or addiction -head injury -heart or circulation problems -if you often drink alcohol -kidney disease or problems going to the bathroom -liver disease -lung disease, asthma, or breathing problems -an unusual or allergic reaction to acetaminophen, oxycodone, other opioid analgesics, other  medicines, foods, dyes, or preservatives -pregnant or trying to get pregnant -breast-feeding How should I use this medicine? Take this medicine by mouth with a full glass of water. Follow the directions on the prescription label. Take your medicine at regular intervals. Do not take your medicine more often than directed. Talk to your pediatrician  regarding the use of this medicine in children. Special care may be needed. Patients over 71 years old may have a stronger reaction and need a smaller dose. Overdosage: If you think you have taken too much of this medicine contact a poison control center or emergency room at once. NOTE: This medicine is only for you. Do not share this medicine with others. What if I miss a dose? If you miss a dose, take it as soon as you can. If it is almost time for your next dose, take only that dose. Do not take double or extra doses. What may interact with this medicine? -alcohol -antihistamines -barbiturates like amobarbital, butalbital, butabarbital, methohexital, pentobarbital, phenobarbital, thiopental, and secobarbital -benztropine -drugs for bladder problems like solifenacin, trospium, oxybutynin, tolterodine, hyoscyamine, and methscopolamine -drugs for breathing problems like ipratropium and tiotropium -drugs for certain stomach or intestine problems like propantheline, homatropine methylbromide, glycopyrrolate, atropine, belladonna, and dicyclomine -general anesthetics like etomidate, ketamine, nitrous oxide, propofol, desflurane, enflurane, halothane, isoflurane, and sevoflurane -medicines for depression, anxiety, or psychotic disturbances -medicines for sleep -muscle relaxants -naltrexone -narcotic medicines (opiates) for pain -phenothiazines like perphenazine, thioridazine, chlorpromazine, mesoridazine, fluphenazine, prochlorperazine, promazine, and trifluoperazine -scopolamine -tramadol -trihexyphenidyl This list may not describe all possible interactions. Give your health care provider a list of all the medicines, herbs, non-prescription drugs, or dietary supplements you use. Also tell them if you smoke, drink alcohol, or use illegal drugs. Some items may interact with your medicine. What should I watch for while using this medicine? Tell your doctor or health care professional if your pain  does not go away, if it gets worse, or if you have new or a different type of pain. You may develop tolerance to the medicine. Tolerance means that you will need a higher dose of the medication for pain relief. Tolerance is normal and is expected if you take this medicine for a long time. Do not suddenly stop taking your medicine because you may develop a severe reaction. Your body becomes used to the medicine. This does NOT mean you are addicted. Addiction is a behavior related to getting and using a drug for a non-medical reason. If you have pain, you have a medical reason to take pain medicine. Your doctor will tell you how much medicine to take. If your doctor wants you to stop the medicine, the dose will be slowly lowered over time to avoid any side effects. You may get drowsy or dizzy. Do not drive, use machinery, or do anything that needs mental alertness until you know how this medicine affects you. Do not stand or sit up quickly, especially if you are an older patient. This reduces the risk of dizzy or fainting spells. Alcohol may interfere with the effect of this medicine. Avoid alcoholic drinks. There are different types of narcotic medicines (opiates) for pain. If you take more than one type at the same time, you may have more side effects. Give your health care provider a list of all medicines you use. Your doctor will tell you how much medicine to take. Do not take more medicine than directed. Call emergency for help if you have problems breathing. The medicine will cause  constipation. Try to have a bowel movement at least every 2 to 3 days. If you do not have a bowel movement for 3 days, call your doctor or health care professional. Do not take Tylenol (acetaminophen) or medicines that have acetaminophen with this medicine. Too much acetaminophen can be very dangerous. Many nonprescription medicines contain acetaminophen. Always read the labels carefully to avoid taking more acetaminophen. What  side effects may I notice from receiving this medicine? Side effects that you should report to your doctor or health care professional as soon as possible: -allergic reactions like skin rash, itching or hives, swelling of the face, lips, or tongue -breathing difficulties, wheezing -confusion -light headedness or fainting spells -severe stomach pain -unusually weak or tired -yellowing of the skin or the whites of the eyes Side effects that usually do not require medical attention (report to your doctor or health care professional if they continue or are bothersome): -dizziness -drowsiness -nausea -vomiting This list may not describe all possible side effects. Call your doctor for medical advice about side effects. You may report side effects to FDA at 1-800-FDA-1088. Where should I keep my medicine? Keep out of the reach of children. This medicine can be abused. Keep your medicine in a safe place to protect it from theft. Do not share this medicine with anyone. Selling or giving away this medicine is dangerous and against the law. Store at room temperature between 20 and 25 degrees C (68 and 77 degrees F). Keep container tightly closed. Protect from light. This medicine may cause accidental overdose and death if it is taken by other adults, children, or pets. Flush any unused medicine down the toilet to reduce the chance of harm. Do not use the medicine after the expiration date. NOTE: This sheet is a summary. It may not cover all possible information. If you have questions about this medicine, talk to your doctor, pharmacist, or health care provider.  2015, [employer]. (2012-10-03 13:17:35)

## 2013-09-23 NOTE — ED Provider Notes (Signed)
CSN: [id number]     Arrival date & time 09/23/13  0055 History   First MD Initiated Contact with Patient 09/23/13 0120     Chief Complaint  Patient presents with  . Insect Bite     (Consider location/radiation/quality/duration/timing/severity/associated sxs/prior Treatment) The history is provided by the patient.   23 year old female was bitten on her left third toe about one hour ago. She is complaining of severe pain in that toe and she rates pain at 10/10. There is mild nausea but she denies any abdominal pain or fever or chills. She did kill the spider about it with her and it does have a red mark on his abdomen.  Past Medical History  Diagnosis Date  . Seizures   . Chronic knee pain    History reviewed. No pertinent past surgical history. Family History  Problem Relation Age of Onset  . Diabetes Father    History  Substance Use Topics  . Smoking status: Current Every Day Smoker -- 0.50 packs/day    Types: Cigarettes  . Smokeless tobacco: Not on file  . Alcohol Use: Yes     Comment: occasional   OB History   Grav Para Term Preterm Abortions TAB SAB Ect Mult Living   0              Review of Systems  All other systems reviewed and are negative.     Allergies  Amoxicillin; Amoxicillin-pot clavulanate; Dilantin; and Penicillins  Home Medications   Prior to Admission medications   Medication Sig Start Date End Date Taking? Authorizing Provider  levETIRAcetam (KEPPRA XR) 500 MG 24 hr tablet Take 1,000 mg by mouth at bedtime.     Historical Provider, MD  levETIRAcetam (KEPPRA XR) 500 MG 24 hr tablet Take 2 tablets (1,000 mg total) by mouth daily. 04/07/13   Tammy L. Triplett, PA-C  Norgestimate-Ethinyl Estradiol Triphasic (ORTHO TRI-CYCLEN LO) 0.18/0.215/0.25 MG-25 MCG tablet Take 1 tablet by mouth daily.      Historical Provider, MD   BP 139/96  Pulse 75  Temp(Src) 98.1 F (36.7 C) (Oral)  Resp 18  Ht 5\' 3"  (1.6 m)  Wt 135 lb (61.236 kg)  BMI 23.92 kg/m2   SpO2 98%  LMP 09/16/2013 Physical Exam  Nursing note and vitals reviewed.  23 year old female, resting comfortably and in no acute distress. Vital signs are significant for borderline hypertension with blood pressure 139/96. Oxygen saturation is 98%, which is normal. Head is normocephalic and atraumatic. PERRLA, EOMI. Oropharynx is clear. Neck is nontender and supple without adenopathy or JVD. Back is nontender and there is no CVA tenderness. Lungs are clear without rales, wheezes, or rhonchi. Chest is nontender. Heart has regular rate and rhythm without murmur. Abdomen is soft, flat, nontender without masses or hepatosplenomegaly and peristalsis is normoactive. Extremities have no cyanosis or edema, full range of motion is present. Left third toe and has mild erythema and swelling. There no lymphangitic streaks and there is no swelling of the foot. Skin is warm and dry without rash. Neurologic: Mental status is normal, cranial nerves are intact, there are no motor or sensory deficits.  ED Course  Procedures (including critical care time) Labs Review Results for orders placed during the hospital encounter of 09/23/13  CBC      Result Value Ref Range   WBC 12.0 (*) 4.0 - 10.5 K/uL   RBC 4.72  3.87 - 5.11 MIL/uL   Hemoglobin 14.2  12.0 - 15.0 g/dL   HCT  40.7  36.0 - 46.0 %   MCV 86.2  78.0 - 100.0 fL   MCH 30.1  26.0 - 34.0 pg   MCHC 34.9  30.0 - 36.0 g/dL   RDW 08.613.1  57.811.5 - 46.915.5 %   Platelets 345  150 - 400 K/uL  COMPREHENSIVE METABOLIC PANEL      Result Value Ref Range   Sodium 138  137 - 147 mEq/L   Potassium 3.6 (*) 3.7 - 5.3 mEq/L   Chloride 102  96 - 112 mEq/L   CO2 24  19 - 32 mEq/L   Glucose, Bld 91  70 - 99 mg/dL   BUN 7  6 - 23 mg/dL   Creatinine, Ser 6.290.56  0.50 - 1.10 mg/dL   Calcium 9.0  8.4 - 52.810.5 mg/dL   Total Protein 7.4  6.0 - 8.3 g/dL   Albumin 3.8  3.5 - 5.2 g/dL   AST 15  0 - 37 U/L   ALT 12  0 - 35 U/L   Alkaline Phosphatase 74  39 - 117 U/L   Total  Bilirubin 0.2 (*) 0.3 - 1.2 mg/dL   GFR calc non Af Amer >90  >90 mL/min   GFR calc Af Amer >90  >90 mL/min   Anion gap 12  5 - 15   MDM   Final diagnoses:  Spider bite allergy, current reaction, accidental or unintentional, initial encounter    Spider bite to left third toe. The spider was completely crushed but does appear that it may be a black widow spider. There is no sign of systemic envenomation. She's given IV fluids and will be given a dose of ketorolac for pain. She'll be observed in the ED for at least one hour to look for signs of systemic envenomation.  She had inadequate pain relief with ketorolac but got good pain relief with a small dose of morphine. She shows no evidence of systemic envenomation and actually, her toe is less erythematous and swollen. She is discharged with prescription for oxycodone acetaminophen for pain and is given a work release for 24 hours.  Dione Boozeavid Kaisey Huseby, MD 09/23/13 [phone]

## 2013-10-02 MED FILL — Oxycodone w/ Acetaminophen Tab 5-325 MG: ORAL | Qty: 6 | Status: AC

## 2013-11-18 ENCOUNTER — Emergency Department: Payer: Self-pay | Admitting: Student

## 2013-11-20 ENCOUNTER — Emergency Department: Payer: Self-pay | Admitting: Emergency Medicine

## 2013-12-02 ENCOUNTER — Emergency Department (HOSPITAL_COMMUNITY)
Admission: EM | Admit: 2013-12-02 | Discharge: 2013-12-02 | Disposition: A | Discharge status: Home / Self Care | Payer: Medicaid Other | Attending: Emergency Medicine | Admitting: Emergency Medicine

## 2013-12-02 ENCOUNTER — Encounter (HOSPITAL_COMMUNITY): Payer: Self-pay | Admitting: Emergency Medicine

## 2013-12-02 DIAGNOSIS — Z79899 Other long term (current) drug therapy: Secondary | ICD-10-CM | POA: Insufficient documentation

## 2013-12-02 DIAGNOSIS — R51 Headache: Secondary | ICD-10-CM | POA: Insufficient documentation

## 2013-12-02 DIAGNOSIS — R0981 Nasal congestion: Secondary | ICD-10-CM | POA: Insufficient documentation

## 2013-12-02 DIAGNOSIS — Z720 Tobacco use: Secondary | ICD-10-CM | POA: Insufficient documentation

## 2013-12-02 DIAGNOSIS — Z880 Allergy status to penicillin: Secondary | ICD-10-CM | POA: Insufficient documentation

## 2013-12-02 DIAGNOSIS — R5383 Other fatigue: Secondary | ICD-10-CM | POA: Insufficient documentation

## 2013-12-02 DIAGNOSIS — R410 Disorientation, unspecified: Secondary | ICD-10-CM | POA: Insufficient documentation

## 2013-12-02 DIAGNOSIS — J029 Acute pharyngitis, unspecified: Secondary | ICD-10-CM | POA: Insufficient documentation

## 2013-12-02 DIAGNOSIS — Z3202 Encounter for pregnancy test, result negative: Secondary | ICD-10-CM | POA: Insufficient documentation

## 2013-12-02 DIAGNOSIS — G8929 Other chronic pain: Secondary | ICD-10-CM | POA: Insufficient documentation

## 2013-12-02 DIAGNOSIS — R519 Headache, unspecified: Secondary | ICD-10-CM

## 2013-12-02 DIAGNOSIS — G40909 Epilepsy, unspecified, not intractable, without status epilepticus: Secondary | ICD-10-CM | POA: Insufficient documentation

## 2013-12-02 DIAGNOSIS — Z8669 Personal history of other diseases of the nervous system and sense organs: Secondary | ICD-10-CM

## 2013-12-02 LAB — URINE MICROSCOPIC-ADD ON

## 2013-12-02 LAB — CBC WITH DIFFERENTIAL/PLATELET
Basophils Absolute: 0 10*3/uL (ref 0.0–0.1)
Basophils Relative: 1 % (ref 0–1)
EOS PCT: 3 % (ref 0–5)
Eosinophils Absolute: 0.2 10*3/uL (ref 0.0–0.7)
HEMATOCRIT: 40.5 % (ref 36.0–46.0)
HEMOGLOBIN: 13.9 g/dL (ref 12.0–15.0)
LYMPHS ABS: 2.7 10*3/uL (ref 0.7–4.0)
LYMPHS PCT: 32 % (ref 12–46)
MCH: 30 pg (ref 26.0–34.0)
MCHC: 34.3 g/dL (ref 30.0–36.0)
MCV: 87.5 fL (ref 78.0–100.0)
MONOS PCT: 8 % (ref 3–12)
Monocytes Absolute: 0.7 10*3/uL (ref 0.1–1.0)
Neutro Abs: 4.7 10*3/uL (ref 1.7–7.7)
Neutrophils Relative %: 56 % (ref 43–77)
Platelets: 338 10*3/uL (ref 150–400)
RBC: 4.63 MIL/uL (ref 3.87–5.11)
RDW: 13.7 % (ref 11.5–15.5)
WBC: 8.4 10*3/uL (ref 4.0–10.5)

## 2013-12-02 LAB — URINALYSIS, ROUTINE W REFLEX MICROSCOPIC
Bilirubin Urine: NEGATIVE
Glucose, UA: NEGATIVE mg/dL
Ketones, ur: NEGATIVE mg/dL
LEUKOCYTES UA: NEGATIVE
Nitrite: NEGATIVE
PH: 7.5 (ref 5.0–8.0)
Protein, ur: NEGATIVE mg/dL
Specific Gravity, Urine: 1.02 (ref 1.005–1.030)
Urobilinogen, UA: 0.2 mg/dL (ref 0.0–1.0)

## 2013-12-02 LAB — BASIC METABOLIC PANEL
Anion gap: 10 (ref 5–15)
BUN: 9 mg/dL (ref 6–23)
CALCIUM: 9.1 mg/dL (ref 8.4–10.5)
CO2: 25 meq/L (ref 19–32)
Chloride: 103 mEq/L (ref 96–112)
Creatinine, Ser: 0.54 mg/dL (ref 0.50–1.10)
GFR calc Af Amer: >90 mL/min (ref >90)
GFR calc non Af Amer: >90 mL/min (ref >90)
GLUCOSE: 87 mg/dL (ref 70–99)
Potassium: 4.3 mEq/L (ref 3.7–5.3)
SODIUM: 138 meq/L (ref 137–147)

## 2013-12-02 LAB — PREGNANCY, URINE: PREG TEST UR: NEGATIVE

## 2013-12-02 MED ORDER — KETOROLAC TROMETHAMINE 30 MG/ML IJ SOLN
30.0000 mg | Freq: Once | INTRAMUSCULAR | Status: AC
  Administered 2013-12-02: 30 mg via INTRAVENOUS
  Filled 2013-12-02: qty 1

## 2013-12-02 MED ORDER — LEVETIRACETAM IN NACL 1000 MG/100ML IV SOLN
1000.0000 mg | Freq: Once | INTRAVENOUS | Status: AC
  Administered 2013-12-02: 1000 mg via INTRAVENOUS
  Filled 2013-12-02: qty 100

## 2013-12-02 NOTE — ED Notes (Addendum)
Patient states she has been having odd sensation in head, feeling like a seizure is going to happen.  HR escalates and one side of face or hand goes numb.  Seizure never occurs. Has resultant confusion after episode.  Has had L sided HA for same length of time.  No changes in medications. Has had recent sinusitis which she has taken Dayquil, but states this is a normal med for her and generally causes no problems.  Was off meds x 3 days while getting RX refilled.  Has never had problems before with short period w/out med (Keppra XR).

## 2013-12-02 NOTE — ED Notes (Signed)
MD at bedside. 

## 2013-12-02 NOTE — Discharge Instructions (Signed)
General Headache Without Cause  A general headache is pain or discomfort felt around the head or neck area. The cause may not be found.   HOME CARE   · Keep all doctor visits.  · Only take medicines as told by your doctor.  · Lie down in a dark, quiet room when you have a headache.  · Keep a journal to find out if certain things bring on headaches. For example, write down:  ¨ What you eat and drink.  ¨ How much sleep you get.  ¨ Any change to your diet or medicines.  · Relax by getting a massage or doing other relaxing activities.  · Put ice or heat packs on the head and neck area as told by your doctor.  · Lessen stress.  · Sit up straight. Do not tighten (tense) your muscles.  · Quit smoking if you smoke.  · Lessen how much alcohol you drink.  · Lessen how much caffeine you drink, or stop drinking caffeine.  · Eat and sleep on a regular schedule.  · Get 7 to 9 hours of sleep, or as told by your doctor.  · Keep lights dim if bright lights bother you or make your headaches worse.  GET HELP RIGHT AWAY IF:   · Your headache becomes really bad.  · You have a fever.  · You have a stiff neck.  · You have trouble seeing.  · Your muscles are weak, or you lose muscle control.  · You lose your balance or have trouble walking.  · You feel like you will pass out (faint), or you pass out.  · You have really bad symptoms that are different than your first symptoms.  · You have problems with the medicines given to you by your doctor.  · Your medicines do not work.  · Your headache feels different than the other headaches.  · You feel sick to your stomach (nauseous) or throw up (vomit).  MAKE SURE YOU:   · Understand these instructions.  · Will watch your condition.  · Will get help right away if you are not doing well or get worse.  Document Released: 11/19/2007 Document Revised: 05/04/2011 Document Reviewed: 01/30/2011  ExitCare® Patient Information ©2015 [employer]. This information is not intended to replace advice given to  you by your health care provider. Make sure you discuss any questions you have with your health care provider.

## 2013-12-02 NOTE — ED Notes (Signed)
Pt c/o HA for 2 days and has been taking DayQuil for cold symptoms without relief, pt resting when nurse entered room but easily awaken, pt A&O to name, place, month and year; friend in room states pt more confused than usual, pt also c/o dizziness and aura

## 2013-12-02 NOTE — ED Provider Notes (Signed)
CSN: [id number]     Arrival date & time 12/02/13  2033 History  This chart was scribed for Geoffery Lyonsouglas Rakeb Kibble, MD by Modena JanskyAlbert Thayil, ED Scribe. This patient was seen in room APA09/APA09 and the patient's care was started at 9:43 PM.   Chief Complaint  Patient presents with  . Seizures   Patient is a 23 y.o. female presenting with headaches. The history is provided by the patient. No language interpreter was used.  Headache Pain location:  Generalized Radiates to:  Does not radiate Onset quality:  Sudden Duration:  3 days Timing:  Constant Progression:  Unchanged Chronicity:  New Relieved by:  None tried Worsened by:  Nothing tried Ineffective treatments:  None tried Associated symptoms: congestion, fatigue and sore throat    HPI Comments: Maurine CaneLinnea M Long is a 23 y.o. female with a hx of seizures who presents to the Emergency Department complaining of constant moderate headache that started 2 days ago. She states that she keeps having a sensation as if a seizure is about to onset and intermittent confusion afterwards. She reports that she usually takes Keppra for her seizures. She states that she went a few days without taking any Keppra but started back yesterday. She reports no drug or alcohol use.   She states that she has also been having sore throat, nasal congestion, and fatigue.   Neurologist- Dr. Junita Pushadkey at [facility] Past Medical History  Diagnosis Date  . Seizures   . Chronic knee pain    History reviewed. No pertinent past surgical history. Family History  Problem Relation Age of Onset  . Diabetes Father    History  Substance Use Topics  . Smoking status: Current Every Day Smoker -- 0.50 packs/day    Types: Cigarettes  . Smokeless tobacco: Not on file  . Alcohol Use: Yes     Comment: occasional   OB History   Grav Para Term Preterm Abortions TAB SAB Ect Mult Living   0              Review of Systems  Constitutional: Positive for fatigue.  HENT: Positive for congestion  and sore throat.   Neurological: Positive for headaches.  Psychiatric/Behavioral: Positive for confusion.  All other systems reviewed and are negative.   Allergies  Amoxicillin; Amoxicillin-pot clavulanate; Dilantin; and Penicillins  Home Medications   Prior to Admission medications   Medication Sig Start Date End Date Taking? Authorizing Provider  levETIRAcetam (KEPPRA XR) 500 MG 24 hr tablet Take 1,000 mg by mouth at bedtime.     Historical Provider, MD  levETIRAcetam (KEPPRA XR) 500 MG 24 hr tablet Take 2 tablets (1,000 mg total) by mouth daily. 04/07/13   Tammy L. Triplett, PA-C  Norgestimate-Ethinyl Estradiol Triphasic (ORTHO TRI-CYCLEN LO) 0.18/0.215/0.25 MG-25 MCG tablet Take 1 tablet by mouth daily.      Historical Provider, MD  oxyCODONE-acetaminophen (PERCOCET/ROXICET) 5-325 MG per tablet Take 1 tablet by mouth every 4 (four) hours as needed. 09/23/13   Dione Boozeavid Glick, MD   BP 132/92  Pulse 79  Temp(Src) 98.2 F (36.8 C) (Oral)  Resp 16  Ht 5\' 3"  (1.6 m)  Wt 139 lb (63.05 kg)  BMI 24.63 kg/m2  SpO2 100%  LMP 11/07/2013 Physical Exam  Nursing note and vitals reviewed. Constitutional: She is oriented to person, place, and time. She appears well-developed and well-nourished. No distress.  HENT:  Head: Normocephalic and atraumatic.  Eyes: EOM are normal. Pupils are equal, round, and reactive to light.  Neck: Neck supple.  No tracheal deviation present.  Cardiovascular: Normal rate and regular rhythm.   No murmur heard. Pulmonary/Chest: Effort normal and breath sounds normal. No respiratory distress. She has no wheezes. She has no rales.  Musculoskeletal: Normal range of motion.  Neurological: She is alert and oriented to person, place, and time. No cranial nerve deficit. Coordination normal.  Skin: Skin is warm and dry.  Psychiatric: She has a normal mood and affect. Her behavior is normal.    ED Course  Procedures (including critical care time) DIAGNOSTIC  STUDIES: Oxygen Saturation is 100% on RA, normal by my interpretation.    COORDINATION OF CARE: 9:47 PM- Pt advised of plan for treatment which includes medication and labs and pt agrees.  Labs Review Labs Reviewed  CBC WITH DIFFERENTIAL  BASIC METABOLIC PANEL    Imaging Review No results found.   EKG Interpretation None      MDM   Final diagnoses:  None    Patient is a 23 year old female who presents with complaints of intermittent dizziness and feeling as if she is going to have a seizure. She is normally taking Keppra however is been off this for several days. She denies any fevers or chills. She does report a mild headache.  Neurologic exam is nonfocal and the patient appears well. Her laboratory studies are unremarkable. She is not pregnant urinalysis is clear. She is given a gram of IV Keppra and I feel as though she is appropriate for discharge. She is to followup with her neurologist as needed.  I personally performed the services described in this documentation, which was scribed in my presence. The recorded information has been reviewed and is accurate.       Geoffery Lyonsouglas Brennyn Haisley, MD 12/02/13 [phone]

## 2013-12-12 ENCOUNTER — Emergency Department: Payer: Self-pay | Admitting: Emergency Medicine

## 2013-12-12 LAB — CBC
HCT: 44.1 % (ref 35.0–47.0)
HGB: 14.3 g/dL (ref 12.0–16.0)
MCH: 29 pg (ref 26.0–34.0)
MCHC: 32.6 g/dL (ref 32.0–36.0)
MCV: 89 fL (ref 80–100)
Platelet: 376 10*3/uL (ref 150–440)
RBC: 4.94 10*6/uL (ref 3.80–5.20)
RDW: 13.8 % (ref 11.5–14.5)
WBC: 8.5 10*3/uL (ref 3.6–11.0)

## 2013-12-12 LAB — URINALYSIS, COMPLETE
BACTERIA: NONE SEEN
Bilirubin,UR: NEGATIVE
GLUCOSE, UR: NEGATIVE mg/dL (ref 0–75)
KETONE: NEGATIVE
Leukocyte Esterase: NEGATIVE
Nitrite: NEGATIVE
Ph: 5 (ref 4.5–8.0)
Protein: NEGATIVE
RBC,UR: 13 /HPF (ref 0–5)
Specific Gravity: 1.025 (ref 1.003–1.030)
Squamous Epithelial: 5
WBC UR: 2 /HPF (ref 0–5)

## 2013-12-12 LAB — BASIC METABOLIC PANEL
Anion Gap: 8 (ref 7–16)
BUN: 7 mg/dL (ref 7–18)
CREATININE: 0.55 mg/dL — AB (ref 0.60–1.30)
Calcium, Total: 8.2 mg/dL — ABNORMAL LOW (ref 8.5–10.1)
Chloride: 108 mmol/L — ABNORMAL HIGH (ref 98–107)
Co2: 25 mmol/L (ref 21–32)
EGFR (African American): >60
EGFR (Non-African Amer.): >60
Glucose: 86 mg/dL (ref 65–99)
OSMOLALITY: 279 (ref 275–301)
Potassium: 3.9 mmol/L (ref 3.5–5.1)
SODIUM: 141 mmol/L (ref 136–145)

## 2013-12-12 LAB — DRUG SCREEN, URINE
AMPHETAMINES, UR SCREEN: NEGATIVE (ref ?–1000)
Barbiturates, Ur Screen: NEGATIVE (ref ?–200)
Benzodiazepine, Ur Scrn: NEGATIVE (ref ?–200)
COCAINE METABOLITE, UR ~~LOC~~: NEGATIVE (ref ?–300)
Cannabinoid 50 Ng, Ur ~~LOC~~: NEGATIVE (ref ?–50)
MDMA (ECSTASY) UR SCREEN: NEGATIVE (ref ?–500)
Methadone, Ur Screen: NEGATIVE (ref ?–300)
OPIATE, UR SCREEN: NEGATIVE (ref ?–300)
Phencyclidine (PCP) Ur S: NEGATIVE (ref ?–25)
TRICYCLIC, UR SCREEN: NEGATIVE (ref ?–1000)

## 2013-12-12 LAB — HCG, QUANTITATIVE, PREGNANCY: Beta Hcg, Quant.: <1 m[IU]/mL — ABNORMAL LOW

## 2014-02-11 ENCOUNTER — Encounter (HOSPITAL_COMMUNITY): Payer: Self-pay | Admitting: Emergency Medicine

## 2014-02-11 ENCOUNTER — Emergency Department (HOSPITAL_COMMUNITY)
Admission: EM | Admit: 2014-02-11 | Discharge: 2014-02-11 | Disposition: A | Discharge status: Home / Self Care | Payer: Medicaid Other | Attending: Emergency Medicine | Admitting: Emergency Medicine

## 2014-02-11 DIAGNOSIS — F445 Conversion disorder with seizures or convulsions: Secondary | ICD-10-CM

## 2014-02-11 DIAGNOSIS — Z3202 Encounter for pregnancy test, result negative: Secondary | ICD-10-CM | POA: Insufficient documentation

## 2014-02-11 DIAGNOSIS — G40909 Epilepsy, unspecified, not intractable, without status epilepticus: Secondary | ICD-10-CM | POA: Insufficient documentation

## 2014-02-11 DIAGNOSIS — G8929 Other chronic pain: Secondary | ICD-10-CM | POA: Insufficient documentation

## 2014-02-11 DIAGNOSIS — R569 Unspecified convulsions: Secondary | ICD-10-CM

## 2014-02-11 DIAGNOSIS — F419 Anxiety disorder, unspecified: Secondary | ICD-10-CM | POA: Insufficient documentation

## 2014-02-11 DIAGNOSIS — Z720 Tobacco use: Secondary | ICD-10-CM | POA: Insufficient documentation

## 2014-02-11 HISTORY — DX: Patient's other noncompliance with medication regimen: Z91.14

## 2014-02-11 HISTORY — DX: Headache: R51

## 2014-02-11 HISTORY — DX: Headache, unspecified: R51.9

## 2014-02-11 HISTORY — DX: Patient's other noncompliance with medication regimen for other reason: Z91.148

## 2014-02-11 HISTORY — DX: Other chronic pain: G89.29

## 2014-02-11 LAB — RAPID URINE DRUG SCREEN, HOSP PERFORMED
Amphetamines: NOT DETECTED
BARBITURATES: NOT DETECTED
Benzodiazepines: POSITIVE — AB
COCAINE: NOT DETECTED
OPIATES: NOT DETECTED
TETRAHYDROCANNABINOL: NOT DETECTED

## 2014-02-11 LAB — CBC WITH DIFFERENTIAL/PLATELET
Basophils Absolute: 0 10*3/uL (ref 0.0–0.1)
Basophils Relative: 1 % (ref 0–1)
Eosinophils Absolute: 0.1 10*3/uL (ref 0.0–0.7)
Eosinophils Relative: 1 % (ref 0–5)
HCT: 42.4 % (ref 36.0–46.0)
Hemoglobin: 14.3 g/dL (ref 12.0–15.0)
LYMPHS PCT: 36 % (ref 12–46)
Lymphs Abs: 2.1 10*3/uL (ref 0.7–4.0)
MCH: 29.6 pg (ref 26.0–34.0)
MCHC: 33.7 g/dL (ref 30.0–36.0)
MCV: 87.8 fL (ref 78.0–100.0)
Monocytes Absolute: 0.4 10*3/uL (ref 0.1–1.0)
Monocytes Relative: 6 % (ref 3–12)
NEUTROS PCT: 56 % (ref 43–77)
Neutro Abs: 3.2 10*3/uL (ref 1.7–7.7)
PLATELETS: 287 10*3/uL (ref 150–400)
RBC: 4.83 MIL/uL (ref 3.87–5.11)
RDW: 13 % (ref 11.5–15.5)
WBC: 5.8 10*3/uL (ref 4.0–10.5)

## 2014-02-11 LAB — COMPREHENSIVE METABOLIC PANEL
ALT: 20 U/L (ref 0–35)
AST: 18 U/L (ref 0–37)
Albumin: 4.1 g/dL (ref 3.5–5.2)
Alkaline Phosphatase: 82 U/L (ref 39–117)
Anion gap: 12 (ref 5–15)
BUN: 5 mg/dL — ABNORMAL LOW (ref 6–23)
CO2: 24 meq/L (ref 19–32)
Calcium: 9.2 mg/dL (ref 8.4–10.5)
Chloride: 103 mEq/L (ref 96–112)
Creatinine, Ser: 0.72 mg/dL (ref 0.50–1.10)
GFR calc Af Amer: >90 mL/min (ref >90)
Glucose, Bld: 84 mg/dL (ref 70–99)
Potassium: 4.1 mEq/L (ref 3.7–5.3)
SODIUM: 139 meq/L (ref 137–147)
Total Bilirubin: 0.2 mg/dL — ABNORMAL LOW (ref 0.3–1.2)
Total Protein: 7.5 g/dL (ref 6.0–8.3)

## 2014-02-11 LAB — POC URINE PREG, ED: Preg Test, Ur: NEGATIVE

## 2014-02-11 LAB — URINALYSIS, ROUTINE W REFLEX MICROSCOPIC
Bilirubin Urine: NEGATIVE
GLUCOSE, UA: NEGATIVE mg/dL
Ketones, ur: NEGATIVE mg/dL
Leukocytes, UA: NEGATIVE
Nitrite: NEGATIVE
Protein, ur: NEGATIVE mg/dL
Specific Gravity, Urine: 1.01 (ref 1.005–1.030)
Urobilinogen, UA: 0.2 mg/dL (ref 0.0–1.0)
pH: 7 (ref 5.0–8.0)

## 2014-02-11 LAB — ETHANOL: Alcohol, Ethyl (B): <11 mg/dL (ref 0–11)

## 2014-02-11 LAB — URINE MICROSCOPIC-ADD ON

## 2014-02-11 MED ORDER — LEVETIRACETAM IN NACL 500 MG/100ML IV SOLN
500.0000 mg | Freq: Once | INTRAVENOUS | Status: AC
  Administered 2014-02-11: 500 mg via INTRAVENOUS
  Filled 2014-02-11: qty 100

## 2014-02-11 MED ORDER — LORAZEPAM 2 MG/ML IJ SOLN
INTRAMUSCULAR | Status: AC
  Filled 2014-02-11: qty 1

## 2014-02-11 MED ORDER — LEVETIRACETAM IN NACL 500 MG/100ML IV SOLN
INTRAVENOUS | Status: AC
  Filled 2014-02-11: qty 100

## 2014-02-11 MED ORDER — SODIUM CHLORIDE 0.9 % IV SOLN
INTRAVENOUS | Status: DC
  Administered 2014-02-11: 17:00:00 via INTRAVENOUS

## 2014-02-11 MED ORDER — HYDROXYZINE PAMOATE 50 MG PO CAPS: 50.0000 mg | ORAL_CAPSULE | Freq: Three times a day (TID) | ORAL | Status: DC | PRN

## 2014-02-11 MED ORDER — HYDROXYZINE HCL 25 MG PO TABS
50.0000 mg | ORAL_TABLET | Freq: Once | ORAL | Status: AC
  Administered 2014-02-11: 50 mg via ORAL
  Filled 2014-02-11: qty 2

## 2014-02-11 NOTE — ED Notes (Signed)
In room to give medications; pt appeared to have another seizure like episode. Other than tachycardia VS were WNL, airway remained intact.  MD presented to BS, sternal rubbed pt, seizure like activity ended abruptly.  After MD left room, pt began crying, pt reassured and comforted.

## 2014-02-11 NOTE — ED Notes (Signed)
Patient asking to use restroom, patient placed on bedpan, patient able to assist with bedpan placement. Patient is now talking in full sentences to fiance. Does not appear postictal.

## 2014-02-11 NOTE — ED Notes (Signed)
AC notified for Keppra.

## 2014-02-11 NOTE — ED Provider Notes (Signed)
This chart was scribed for Patricia MawKristen N Ward, DO by Roxy Cedarhandni Bhalodia, ED Scribe. This patient was seen in room APA01/APA01 and the patient's care was started at 4:16 PM.  TIME SEEN: 4:17 PM  CHIEF COMPLAINT: Seizures   HPI: Patient is a 23 y.o. female who presents to the ED complaining of seizures. She has history of possible seizures in the past. Patient is on Keppra medication. She is coming in with multiple witnessed seizures by EMS today. Upon examination, patient was actively having seizure in room. She was unable to give history or review of systems.  ROS: Level V caveat for altered mental status   PAST MEDICAL HISTORY/PAST SURGICAL HISTORY:  Past Medical History  Diagnosis Date  . Seizures   . Chronic knee pain   . Chronic headache   . Noncompliance with medications     MEDICATIONS:  Prior to Admission medications   Medication Sig Start Date End Date Taking? Authorizing Provider  citalopram (CELEXA) 20 MG tablet Take 20 mg by mouth daily.   Yes Historical Provider, MD  levETIRAcetam (KEPPRA XR) 500 MG 24 hr tablet Take 2 tablets (1,000 mg total) by mouth daily. 04/07/13  Yes Tammy L. Triplett, PA-C  Norgestimate-Ethinyl Estradiol Triphasic (ORTHO TRI-CYCLEN LO) 0.18/0.215/0.25 MG-25 MCG tablet Take 1 tablet by mouth daily.      Historical Provider, MD    ALLERGIES:  Allergies  Allergen Reactions  . Amoxicillin Hives  . Amoxicillin-Pot Clavulanate Hives and Nausea And Vomiting  . Dilantin [Phenytoin] Hives  . Influenza Vaccines Other (See Comments)    Cough  . Penicillins Hives  . Latex Hives and Rash    SOCIAL HISTORY:  History  Substance Use Topics  . Smoking status: Current Every Day Smoker -- 0.50 packs/day    Types: Cigarettes  . Smokeless tobacco: Not on file  . Alcohol Use: Yes     Comment: occasional    FAMILY HISTORY: Family History  Problem Relation Age of Onset  . Diabetes Father     EXAM: BP 105/73 mmHg  Pulse 75  Resp 22  SpO2 98%  LMP  10/24/2013 CONSTITUTIONAL: Patient is alert but having what appears to be a nonepileptic seizure when I enter the room, she has her legs crossed but has stiffening of her upper extremities and torso and slowly sat upright in the bedand is turning her head to the left, patient appeared to be holding her breath during this episode but then takes a big deep inspiration, mildly tachycardic but this rapidly improved once episode stopped, patient was immediately able to look at me and speak after the event, no tongue biting or incontinence, no postictal period HEAD: Normocephalic EYES: Conjunctivae clear, PERRL ENT: normal nose; no rhinorrhea; moist mucous membranes; pharynx without lesions noted NECK: Supple, no meningismus, no LAD  CARD: RRR; S1 and S2 appreciated; no murmurs, no clicks, no rubs, no gallops RESP: Normal chest excursion without splinting or tachypnea; breath sounds clear and equal bilaterally; no wheezes, no rhonchi, no rales, no hypoxia or respiratory distress ABD/GI: Normal bowel sounds; non-distended; soft, non-tender, no rebound, no guarding BACK:  The back appears normal and is non-tender to palpation, there is no CVA tenderness EXT: Normal ROM in all joints; non-tender to palpation; no edema; normal capillary refill; no cyanosis    SKIN: Normal color for age and race; warm NEURO: Moves all extremities equally, sensation to light touch intact diffusely, cranial nerves II through XII intact PSYCH: The patient's mood and manner are appropriate. Grooming  and personal hygiene are appropriate.  MEDICAL DECISION MAKING: 5:00 PM  Reassessed pt, now more awake, oriented x 3.  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 States that when she was 23 years old she was diagnosed with epileptic seizures and had an EEG [facility]. She is on Keppra. She states that since the end of October she has had between 1-15 seizures a day. She was [facility] to the hospital at  [facility] for 4 days in November and had an EEG with no epileptiform activity. States that [facility] neurologist at [facility] feels that her seizures are not epileptic and are related to stress. States she has an appointment to follow-up with her neurologist on January 12. Denies any head injury, numbness, tingling or focal weakness, fever, cough, vomiting or diarrhea. She states she feels sore all over. Husband at bedside states the reason he called EMS was that she had 5 episodes back-to-back and he was concerned. He states that she was able to follow commands and answer questions during these episodes.  ED PROGRESS: 5:40 PM  Pt had another episode of seizure like activity that stopped with sternal rub abruptly. She was tachycardic and apneic during this episode but suspect the apnea was self-induced. She was then awake, responsive, no post ictal period.  Vital signs quickly improved. 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 She states she has had a lot of stress in her life recently, anxiety. She is now crying. Denies SI or HI. Continue to reassure her.  6:00 PM  W/u unremarkable other than blood in her urine with no other sign of infection. Discussed with her she needs to follow-up with her primary care for this. No gross hematuria or flank pain. Normal creatinine. Normal blood pressure. Discussed again with patient that I recommend that she see a psychiatrist. She states that she lives in [place] and does not have insurance. Will give outpatient resource guide she states that migraine for is too far for her to drive yet she is here in [place] today. Have offered to put a consult and for case management and social work to help patient get outpatient resources. She does see a neurologist at [facility] and has charity care there. Have also discussed with her that this is another option to establish a PCP and psychiatrist. 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 Her husband reports that her pseudoseizures got worse after she ran out of a short course of clonazepam several weeks ago. She is still on Celexa 20 mg daily. Have offered her Vistaril to help with her anxieties which she agrees to. Have discussed return precautions. She verbalizes understanding and is comfortable with this plan.      Date: 02/11/2014 16:24  Rate: 89  Rhythm: normal sinus rhythm  QRS Axis: normal  Intervals: normal  ST/T Wave abnormalities: normal  Conduction Disutrbances: none  Narrative Interpretation: unremarkable       Patricia MawKristen N Ward, DO 02/11/14 1811

## 2014-02-11 NOTE — Discharge Instructions (Signed)
Driving and Equipment Restrictions Some medical problems make it dangerous to drive, ride a bike, or use machines. Some of these problems are:  A hard blow to the head (concussion).  Passing out (fainting).  Twitching and shaking (seizures).  Low blood sugar.  Taking medicine to help you relax (sedatives).  Taking pain medicines.  Wearing an eye patch.  Wearing splints. This can make it hard to use parts of your body that you need to drive safely. HOME CARE   Do not drive until your doctor says it is okay.  Do not use machines until your doctor says it is okay. You may need a form signed by your doctor (medical release) before you can drive again. You may also need this form before you do other tasks where you need to be fully alert. MAKE SURE YOU:  Understand these instructions.  Will watch your condition.  Will get help right away if you are not doing well or get worse. Document Released: 03/19/2004 Document Revised: 05/04/2011 Document Reviewed: 06/19/2009 [facility] Patient Information 2015 [employer], [place]. This information is not intended to replace advice given to you by your health care provider. Make sure you discuss any questions you have with your health care provider.  Nonepileptic Seizures Nonepileptic seizures are seizures that are not caused by abnormal electrical signals in your brain. These seizures often seem like epileptic seizures, but they are not caused by epilepsy.  There are two types of nonepileptic seizures:  A physiologic nonepileptic seizure results from a disruption in your brain.  A psychogenic seizure results from emotional stress. These seizures are sometimes called pseudoseizures. CAUSES  Causes of physiologic nonepileptic seizures include:   Sudden drop in blood pressure.  Low blood sugar.  Low levels of salt (sodium) in your blood.  Low levels of calcium in your blood.  Migraine.  Heart rhythm problems.  Sleep disorders.  Drug  and alcohol abuse. Common causes of psychogenic nonepileptic seizures include:  Stress.  Emotional trauma.  Sexual or physical abuse.  Major life events, such as divorce or the death of a loved one.  Mental health disorders, including panic attack and hyperactivity disorder. SIGNS AND SYMPTOMS A nonepileptic seizure can look like an epileptic seizure, including uncontrollable shaking (convulsions), or changes in attention, behavior, or the ability to remain awake and alert. However, there are some differences. Nonepileptic seizures usually:  Do not cause physical injuries.  Start slowly.  Include crying or shrieking.  Last longer than 2 minutes.  Have a short recovery time without headache or exhaustion. DIAGNOSIS  Your health care provider can usually diagnose nonepileptic seizures after taking your medical history and giving you a physical exam. Your health care provider may want to talk to your friends or relatives who have seen you have a seizure.  You may also need to have tests to look for causes of physiologic nonepileptic seizures. This may include an electroencephalogram (EEG), which is a test that measures electrical activity in your brain. If you have had an epileptic seizure, the results of your EEG will be abnormal. If your health care provider thinks you have had a psychogenic nonepileptic seizure, you may need to see a mental health specialist for an evaluation. TREATMENT  Treatment depends on the type and cause of your seizures.  For physiologic nonepileptic seizures, treatment is aimed at addressing the underlying condition that caused the seizures. These seizures usually stop when the underlying condition is properly treated.  Nonepileptic seizures do not respond to the  seizure medicines used to treat epilepsy.  For psychogenic seizures, you may need to work with a mental health specialist. HOME CARE INSTRUCTIONS Home care will depend on the type of nonepileptic  seizures you have.   Follow all your health care provider's instructions.  Keep all your follow-up appointments. SEEK MEDICAL CARE IF: You continue to have seizures after treatment. SEEK IMMEDIATE MEDICAL CARE IF:  Your seizures change or become more frequent.  You injure yourself during a seizure.  You have one seizure after another.  You have trouble recovering from a seizure.  You have chest pain or trouble breathing. MAKE SURE YOU:  Understand these instructions.  Will watch your condition.  Will get help right away if you are not doing well or get worse. Document Released: 03/27/2005 Document Revised: 06/26/2013 Document Reviewed: 12/06/2012 [facility] CenterExitCare Patient Information 2015 [place]ExitCare, [place]LLC. This information is not intended to replace advice given to you by your health care provider. Make sure you discuss any questions you have with your health care provider.   Emergency Department Resource Guide 1) Find a Doctor and Pay Out of Pocket Although you won't have to find out who is covered by your insurance plan, it is a good idea to ask around and get recommendations. You will then need to call the office and see if the doctor you have chosen will accept you as a new patient and what types of options they offer for patients who are self-pay. Some doctors offer discounts or will set up payment plans for their patients who do not have insurance, but you will need to ask so you aren't surprised when you get to your appointment.  2) Contact Your Local Health Department Not all health departments have doctors that can see patients for sick visits, but many do, so it is worth a call to see if yours does. If you don't know where your local health department is, you can check in your phone book. The CDC also has a tool to help you locate your state's health department, and many state websites also have listings of all of their local health departments.  3) Find a Walk-in Clinic If your  illness is not likely to be very severe or complicated, you may want to try a walk in clinic. These are popping up all over the country in pharmacies, drugstores, and shopping centers. They're usually staffed by nurse practitioners or physician assistants that have been trained to treat common illnesses and complaints. They're usually fairly quick and inexpensive. However, if you have serious medical issues or chronic medical problems, these are probably not your best option.  No Primary Care Doctor: - Call Health Connect at  [phone] - they can help you locate a primary care doctor that  accepts your insurance, provides certain services, etc. - Physician Referral Service- [phone]  Chronic Pain Problems: Organization         Address  Phone   Notes  [facility]  [phone] Patients need to be referred by their primary care doctor.   Medication Assistance: Organization         Address  Phone   Notes  [facility] Medication [facility] [address] [place], [place] [address] [phone] --Must be a resident of [facility] -- Must have NO insurance coverage whatsoever (no Medicaid/ Medicare, etc.) -- The pt. MUST have a primary care doctor that directs their care regularly and follows them in the community   MedAssist  (  [phone]) [phone]   [employer]  [phone]    Agencies that provide inexpensive medical care: Organization         Address  Phone   Notes  [facility]  [phone]   [facility]    [phone]) [phone]   [facility] [address] [place], [place] [address] [phone]) [phone]   [facility] [address], [place] [phone]) [phone]   Planned Parenthood    [phone](336) [phone]   [facility]    [phone]   [facility]  [address], [place] Phone:  [phone](336) [phone], Fax:  [phone] Hours of Operation:   9 am - 6 pm, M-F.  Also accepts Medicaid/Medicare and self-pay.  [facility]  [address], [place] Phone: [phone], Fax: [phone]. Hours of Operation:  8:30 am - 5:30 pm, M-F.  Also accepts Medicaid and self-pay.  [facility] [place] [address], [place] Phone: [phone]) [phone]   [facility] [address], [place], [place] [phone], Ext. [phone] Mondays & Thursdays: 7-9 AM.  First 15 patients are seen on a first come, first serve basis.    Medicaid-accepting [facility] Providers:  Organization         Address  Phone   Notes  [facility] [address], [place] [phone] Also accepts self-pay patients.  [facility] [address], [place]  [phone]   [facility] [address], [place] [phone]) [phone]   [facility] [address] [place] [address], [place] [phone]) [phone]   Renaye RakersVeita Bland [address], [place]   [phone] Only accepts [place] Access [place]icaid patients after they have their name applied to their card.   Self-Pay (no insurance) in [facility] [place]:  Organization         Address  Phone   Notes  Sickle Cell Patients, [facility] [address], [place] [phone]) [phone]   [facility] [address], [place] [phone]) [phone]   [facility] [place]  [address], [place] [phone](336) [phone]   [facility]/Dr. Osei-Bonsu  [address] [place] [address], [place] or [phone], [place] [phone] Phone number for both [place] and [place] locations is the same.  [facility] [address], [place] [phone](336) [phone]   [facility] [place] [address] [place] [address], [place] or [address] [phone]   [facility] [address], [place] [phone](336) [phone], phone; [phone]) [phone], fax Sees patients 1st and 3rd Saturday of every month.  Must not qualify for public or private insurance (i.e. Medicaid, Medicare, [place] Health Choice, Veterans' Benefits)  Household income should be no more than 200% of the poverty level The clinic cannot treat you if you are pregnant or think you are pregnant  Sexually transmitted diseases are not treated at the clinic.    Dental Care: Organization         Address  Phone  Notes  [facility] Department of [facility] [address], [place] [phone] Accepts children up to age 11021 who are enrolled in [place]icaid or  Health Choice; pregnant women  with a Medicaid card; and children who have applied for Medicaid or [place] Health Choice, but were declined, whose parents can pay a reduced fee at time of service.  [facility]  [address], [place] [phone] Accepts children up to age 56 who are enrolled in [place] or [place] Health Choice; pregnant women with a Medicaid card; and children who have applied for Medicaid or  Health Choice, but were declined, whose parents can pay a reduced fee at time of service.  [facility]  [address] [place], [place] [phone] Patients are seen by appointment only. Walk-ins are not accepted. [facility] will see patients 73 years of age and older. Monday - Tuesday (8am-5pm) Most Wednesdays (8:30-5pm) $30 per visit, cash only  [facility] Access PROGRAM  [address], [facility] [phone] Patients are seen by appointment only. Walk-ins are not accepted. [facility] will see patients 87 years of age and older. One Wednesday Evening (Monthly: Volunteer Based).  $30 per visit, cash only  [employer]  [phone] for adults; Children under age 61, call Graduate Pediatric Dentistry at  [phone]. Children aged 51-14, please call [phone] to request a pediatric application.  Dental services are provided in all areas of dental care including fillings, crowns and bridges, complete and partial dentures, implants, gum treatment, root canals, and extractions. Preventive care is also provided. Treatment is provided to both adults and children. Patients are selected via a lottery and there is often a waiting list.   [facility] [address], [place]  [phone] [email or web address]   [facility] [address] [place], [place] [phone], Ext. [phone] Second and Fourth Thursday of each month, opens at 6:30 AM; Clinic ends at 9 AM.  Patients are seen on a first-come first-served basis, and a limited number are seen during each clinic.   [facility]  [address] [place], [place] [phone]   Eligibility Requirements You must have lived in [place], [place], or [place] counties for at least the last three months.   You cannot be eligible for state or federal sponsored [place], including [employer], [place], or [employer].   You generally cannot be eligible for healthcare insurance through your employer.    How to apply: Eligibility screenings are held every Tuesday and Wednesday afternoon from 1:00 pm until 4:00 pm. You do not need an appointment for the interview!  [facility] [address], [place], [place] [phone]   [facility] Health Department  [phone]   [facility] Health Department  [phone]   [facility] Health Department  [phone]    Behavioral Health Resources in the Community: Intensive Outpatient Programs Organization         Address  Phone  Notes  [facility] [address] [place], [place] [phone]   [facility] Outpatient [address] [place], [place] [phone]   ADS: Alcohol &  Drug Svcs [address] [place], [place]  [phone]   [facility] [address],  [place], [place] [phone] or [phone]   Substance Abuse Resources Organization         Address  Phone  Notes  Alcohol and Drug Services  [phone]   [facility]  [phone]   [facility] [place]  [phone]   [facility]  [phone]   Residential & Outpatient Substance Abuse Program  [phone]   Psychological Services Organization         Address  Phone  Notes  [facility]  [phone]- [phone]   [facility]  [phone]- [phone]   [facility] [address], [place] [phone] or [phone]    Mobile Crisis Teams Organization         Address  Phone  Notes  [facility], Mobile Crisis Care Unit  [phone]   [facility]  [address] [place], [place] [phone]   Doristine LocksSharon DeEsch [address] [place] [phone]    Self-Help/Support Groups Organization         Address  Phone             Notes  [facility] [place] - variety of support groups  336- [phone] Call for more information  Narcotics Anonymous (NA), [facility] [address], [place]  2 meetings at this location   Statisticianesidential Treatment Programs Organization         Address  Phone  Notes  [facility] [address],    [place]  [phone]   [facility]  [address], [place] [phone], [place], [place] [phone]   [facility] [address], [place] [phone] Admissions: 8am-3pm M-F  [facility] [address]    [place], [place] [phone]   [facility] [address], [place], [place] [phone]   [facility] [address]  [place], [place] [phone]   [facility] - Intensive Outpatient [address], [place], [place]  [phone]   [facility] [address]  [place], [place] [phone] or [phone]   [facility] (RT[facility]) [address] [place], [place] [phone] Accepts Medicaid  [facility] [address]  [place] [phone] Substance Abuse/Addiction Treatment   [facility] Behavioral Health Resources Organization         Address  Phone  Notes  [facility]  [phone](888) [phone]   Angie FavaJulie Brannon, PhD [address], [address] [place], [place]   [phone]) [phone] or [phone]   [facility]   [address] [place], [place] [phone](336) [phone]   [facility] [address], [place], [place] [phone]) [phone] Insurance/Medicaid/sponsorship through [facility]  [facility] Families [address]                                    [place], [place] [phone]) [phone] Therapy/tele-psych/case  [facility] [address]   [place], [place] [phone](336) [phone]    Dr. Lolly MustacheArfeen  [phone](336) [phone]   [facility]  [employer] [facility] 1) [address], [place] 2) [address], [place] 3)  [address], [place] [phone]  [phone](336) [phone]   [facility] Child Abuse Hotline [phone](336) [phone] or [phone]) [phone] (After Hours)

## 2014-02-11 NOTE — ED Notes (Signed)
Per EMS pt had multiple witnessed seizures today. 2 witnessed by EMS lasting approximately 2 minutes each. 2.5mg  of versed IV given 3:55pm by EMS. Pt alert upon ED arrival. VSS. CBG-79.

## 2014-02-12 LAB — CBG MONITORING, ED: Glucose-Capillary: 87 mg/dL (ref 70–99)

## 2014-04-18 ENCOUNTER — Encounter (HOSPITAL_COMMUNITY): Payer: Self-pay | Admitting: *Deleted

## 2014-04-18 ENCOUNTER — Emergency Department (HOSPITAL_COMMUNITY)
Admission: EM | Admit: 2014-04-18 | Discharge: 2014-04-18 | Disposition: A | Discharge status: Home / Self Care | Payer: Self-pay | Attending: Emergency Medicine | Admitting: Emergency Medicine

## 2014-04-18 DIAGNOSIS — S161XXA Strain of muscle, fascia and tendon at neck level, initial encounter: Secondary | ICD-10-CM | POA: Insufficient documentation

## 2014-04-18 DIAGNOSIS — Y9289 Other specified places as the place of occurrence of the external cause: Secondary | ICD-10-CM | POA: Insufficient documentation

## 2014-04-18 DIAGNOSIS — Z91040 Latex allergy status: Secondary | ICD-10-CM | POA: Insufficient documentation

## 2014-04-18 DIAGNOSIS — W01198A Fall on same level from slipping, tripping and stumbling with subsequent striking against other object, initial encounter: Secondary | ICD-10-CM | POA: Insufficient documentation

## 2014-04-18 DIAGNOSIS — R569 Unspecified convulsions: Secondary | ICD-10-CM | POA: Insufficient documentation

## 2014-04-18 DIAGNOSIS — Z9119 Patient's noncompliance with other medical treatment and regimen: Secondary | ICD-10-CM | POA: Insufficient documentation

## 2014-04-18 DIAGNOSIS — Y998 Other external cause status: Secondary | ICD-10-CM | POA: Insufficient documentation

## 2014-04-18 DIAGNOSIS — Y9389 Activity, other specified: Secondary | ICD-10-CM | POA: Insufficient documentation

## 2014-04-18 DIAGNOSIS — F445 Conversion disorder with seizures or convulsions: Secondary | ICD-10-CM

## 2014-04-18 DIAGNOSIS — Z720 Tobacco use: Secondary | ICD-10-CM | POA: Insufficient documentation

## 2014-04-18 DIAGNOSIS — F419 Anxiety disorder, unspecified: Secondary | ICD-10-CM | POA: Insufficient documentation

## 2014-04-18 DIAGNOSIS — G8929 Other chronic pain: Secondary | ICD-10-CM | POA: Insufficient documentation

## 2014-04-18 DIAGNOSIS — W19XXXA Unspecified fall, initial encounter: Secondary | ICD-10-CM

## 2014-04-18 DIAGNOSIS — Z880 Allergy status to penicillin: Secondary | ICD-10-CM | POA: Insufficient documentation

## 2014-04-18 DIAGNOSIS — Z79899 Other long term (current) drug therapy: Secondary | ICD-10-CM | POA: Insufficient documentation

## 2014-04-18 HISTORY — DX: Unspecified convulsions: R56.9

## 2014-04-18 HISTORY — DX: Conversion disorder with seizures or convulsions: F44.5

## 2014-04-18 HISTORY — DX: Personal history of other medical treatment: Z92.89

## 2014-04-18 HISTORY — DX: Anxiety disorder, unspecified: F41.9

## 2014-04-18 MED ORDER — LEVETIRACETAM 500 MG PO TABS
1000.0000 mg | ORAL_TABLET | Freq: Two times a day (BID) | ORAL | Status: DC
  Administered 2014-04-18: 1000 mg via ORAL
  Filled 2014-04-18: qty 2

## 2014-04-18 MED ORDER — KETOROLAC TROMETHAMINE 60 MG/2ML IM SOLN
60.0000 mg | Freq: Once | INTRAMUSCULAR | Status: AC
  Administered 2014-04-18: 60 mg via INTRAMUSCULAR
  Filled 2014-04-18: qty 2

## 2014-04-18 MED ORDER — LEVETIRACETAM ER 500 MG PO TB24: 2000.0000 mg | ORAL_TABLET | Freq: Every day | ORAL | Status: DC

## 2014-04-18 NOTE — ED Notes (Signed)
Pt requesting bed pan before VS could be taken, ED tech in room with pt to obtain VS and urine specimen.

## 2014-04-18 NOTE — ED Notes (Addendum)
Per EMS pt has HX of seizures, had an episode at the bank this morning, pt did not appear to be post ictal on arrival. Pt had "another episode of shaking in ambulance - pt easily aroused after shaking and eye fluttering". Pt states she has been out of her lamictal for awhile. Pt requested C-collar from EMS, states she had 2 seizures last night and her neck hurts, pt also states she fell at the bank today from a seated position during her seizure.

## 2014-04-18 NOTE — ED Provider Notes (Signed)
CSN: [id number]     Arrival date & time 04/18/14  1358 History  This chart was scribed for Patricia Melter, MD by Patricia Irwin, ED Scribe. This patient was seen in room APA02/APA02 and the patient's care was started at 2:54 PM.    Chief Complaint  Patient presents with  . Seizures   Patient is a 24 y.o. female presenting with seizures. The history is provided by the patient and the EMS personnel. No language interpreter was used.  Seizures    HPI Comments: Patricia Irwin is a 24 y.o. female with past medical history of seizures who presents to the Emergency Department complaining of pseudo-seizures and neck pain. Patient reports that she had four pseudoseizures last night and another at the bank this morning; she generally goes from standing to "board-like" to falling. Today, patient moved herself into a seated position when she sensed the onset of her seizure; she did fall to the side and hit her head on the concrete. She believes her neck pain is a result of pulling a muscle during last nights pseudoseizures; she took  ibuprofen last night without relief.   She reports she has been out of her Keppra for a "week or so;" she explains she ran out of her medications after the dose was adjusted. She has not been able to get her new dose ([facility] called in a refill yesterday) because the "pharmaceutical company has not yet received the order."   Past Medical History  Diagnosis Date  . Chronic knee pain   . Chronic headache   . Noncompliance with medications   . Pseudoseizures   . Anxiety   . Seizures   . Hx of electroencephalogram 12/2013 ([facility])    normal, dx non-epileptic seizures   History reviewed. No pertinent past surgical history. Family History  Problem Relation Age of Onset  . Diabetes Father    History  Substance Use Topics  . Smoking status: Current Every Day Smoker -- 0.50 packs/day    Types: Cigarettes  . Smokeless tobacco: Not on file  . Alcohol Use: Yes     Comment:  occasional   OB History    Gravida Para Term Preterm AB TAB SAB Ectopic Multiple Living   0              Review of Systems  Musculoskeletal: Positive for neck pain.  Neurological: Positive for seizures (Pseudoseizures ).  All other systems reviewed and are negative.  Allergies  Amoxicillin; Amoxicillin-pot clavulanate; Dilantin; Influenza vaccines; Penicillins; and Latex  Home Medications   Prior to Admission medications   Medication Sig Start Date End Date Taking? Authorizing Provider  clonazePAM (KLONOPIN) 0.5 MG disintegrating tablet Take 0.5 mg by mouth 3 (three) times daily as needed. 04/14/14  Yes Historical Provider, MD  clonazePAM (KLONOPIN) 0.5 MG tablet Take 0.5 mg by mouth 3 (three) times daily as needed for anxiety.   Yes Historical Provider, MD  hydrOXYzine (ATARAX/VISTARIL) 10 MG tablet Take 10 mg by mouth 3 (three) times daily as needed for anxiety.   Yes Historical Provider, MD  ibuprofen (ADVIL,MOTRIN) 800 MG tablet Take 800 mg by mouth daily as needed for headache.   Yes Historical Provider, MD  levETIRAcetam (KEPPRA XR) 500 MG 24 hr tablet Take 2,000 mg by mouth daily.   Yes Historical Provider, MD  hydrOXYzine (VISTARIL) 50 MG capsule Take 1 capsule (50 mg total) by mouth 3 (three) times daily as needed for anxiety. Patient not taking: Reported on 04/18/2014 02/11/14  Patricia N Ward, DO  levETIRAcetam (KEPPRA XR) 500 MG 24 hr tablet Take 2 tablets (1,000 mg total) by mouth daily. Patient not taking: Reported on 04/18/2014 04/07/13   Patricia Irwin. Triplett, PA-C   BP 121/78 mmHg  Pulse 71  Temp(Src) 98 F (36.7 C) (Oral)  Resp 16  Ht  (1.6 m)  Wt 146 lb (66.225 kg)  BMI 25.87 kg/m2  SpO2 98% Physical Exam  Constitutional: She is oriented to person, place, and time. She appears well-developed and well-nourished.  HENT:  Head: Normocephalic and atraumatic.  Eyes: Conjunctivae and EOM are normal. Pupils are equal, round, and reactive to light.  Neck: Normal  range of motion and phonation normal. Neck supple.  Cardiovascular: Normal rate and regular rhythm.   Pulmonary/Chest: Effort normal and breath sounds normal. She exhibits no tenderness.  Abdominal: Soft. She exhibits no distension. There is no tenderness. There is no guarding.  Musculoskeletal: Normal range of motion.  Pain in right trapezius.  Normal range of motion, neck, without pain.  Neurological: She is alert and oriented to person, place, and time. She exhibits normal muscle tone.  Skin: Skin is warm and dry.  Psychiatric: She has a normal mood and affect. Her behavior is normal. Judgment and thought content normal.  Nursing note and vitals reviewed.   ED Course  Procedures   DIAGNOSTIC STUDIES: Oxygen Saturation is 98% on RA, normal by my interpretation.    COORDINATION OF CARE:  Medications  levETIRAcetam (KEPPRA) tablet 1,000 mg (1,000 mg Oral Given 04/18/14 1516)  ketorolac (TORADOL) injection 60 mg (60 mg Intramuscular Given 04/18/14 1516)    Patient Vitals for the past 24 hrs:  BP Temp Temp src Pulse Resp SpO2 Height Weight  04/18/14 1423 - - - 71 16 98 % - -  04/18/14 1412 121/78 mmHg 98 F (36.7 C) Oral 83 15 98 %  (1.6 m) 146 lb (66.225 kg)  04/18/14 1411 121/78 mmHg - - - - - - -   2:57 PM Discussed treatment plan with pt at bedside and pt agreed to plan.  3:54 PM Reevaluation with update and discussion. After initial assessment and treatment, an updated evaluation reveals she is comfortable now.  She is standing up and moving about easily.  Findings discussed with patient and mother, all questions answered. Patricia Irwin    Labs Review Labs Reviewed - No data to display  Imaging Review No results found.   EKG Interpretation  Date/Time:    Ventricular Rate:    PR Interval:    QRS Duration:   QT Interval:    QTC Calculation:   R Axis:     Text Interpretation:         MDM   Final diagnoses:  Pseudoseizure  Fall, initial encounter  Neck  strain, initial encounter    Pseudoseizures, with fall.  Mild right-sided neck injury.  Doubt cervical or other spine fracture.  Doubt significant seizure today.  Medication noncompliance with ability to take medication, which has been called in by her physician.  Nursing Notes Reviewed/ Care Coordinated Applicable Imaging Reviewed Interpretation of Laboratory Data incorporated into ED treatment  The patient appears reasonably screened and/or stabilized for discharge and I doubt any other medical condition or other [facility] requiring further screening, evaluation, or treatment in the ED at this time prior to discharge.  Plan: Home Medications- IBU; Home Treatments- rest, ice, heat; return here if the recommended treatment, does not improve the symptoms; Recommended follow up- PCP  prn   I personally performed the services described in this documentation, which was scribed in my presence. The recorded information has been reviewed and is accurate.        Patricia MelterElliott Irwin Taylie Helder, MD 04/18/14 [phone]

## 2014-04-18 NOTE — Discharge Instructions (Signed)
Use ice on the sore areas 3 times a day for 2 days.  After that, use heat. Use an over-the-counter medication such as ibuprofen 400 mg 3 times a day for pain. Start taking your Keppra, tomorrow.    Cervical Sprain A cervical sprain is when the tissues (ligaments) that hold the neck bones in place stretch or tear. HOME CARE   Put ice on the injured area.  Put ice in a plastic bag.  Place a towel between your skin and the bag.  Leave the ice on for 15-20 minutes, 3-4 times a day.  You may have been given a collar to wear. This collar keeps your neck from moving while you heal.  Do not take the collar off unless told by your doctor.  If you have long hair, keep it outside of the collar.  Ask your doctor before changing the position of your collar. You may need to change its position over time to make it more comfortable.  If you are allowed to take off the collar for cleaning or bathing, follow your doctor's instructions on how to do it safely.  Keep your collar clean by wiping it with mild soap and water. Dry it completely. If the collar has removable pads, remove them every 1-2 days to hand wash them with soap and water. Allow them to air dry. They should be dry before you wear them in the collar.  Do not drive while wearing the collar.  Only take medicine as told by your doctor.  Keep all doctor visits as told.  Keep all physical therapy visits as told.  Adjust your work station so that you have good posture while you work.  Avoid positions and activities that make your problems worse.  Warm up and stretch before being active. GET HELP IF:  Your pain is not controlled with medicine.  You cannot take less pain medicine over time as planned.  Your activity level does not improve as expected. GET HELP RIGHT AWAY IF:   You are bleeding.  Your stomach is upset.  You have an allergic reaction to your medicine.  You develop new problems that you cannot  explain.  You lose feeling (become numb) or you cannot move any part of your body (paralysis).  You have tingling or weakness in any part of your body.  Your symptoms get worse. Symptoms include:  Pain, soreness, stiffness, puffiness (swelling), or a burning feeling in your neck.  Pain when your neck is touched.  Shoulder or upper back pain.  Limited ability to move your neck.  Headache.  Dizziness.  Your hands or arms feel week, lose feeling, or tingle.  Muscle spasms.  Difficulty swallowing or chewing. MAKE SURE YOU:   Understand these instructions.  Will watch your condition.  Will get help right away if you are not doing well or get worse. Document Released: 07/29/2007 Document Revised: 10/12/2012 Document Reviewed: 08/17/2012 [facility] Patient Information 2015 [employer] [place]. This information is not intended to replace advice given to you by your health care provider. Make sure you discuss any questions you have with your health care provider.

## 2014-11-24 ENCOUNTER — Emergency Department (HOSPITAL_COMMUNITY)
Admission: EM | Admit: 2014-11-24 | Discharge: 2014-11-24 | Disposition: A | Discharge status: Home / Self Care | Payer: Medicaid Other | Attending: Emergency Medicine | Admitting: Emergency Medicine

## 2014-11-24 ENCOUNTER — Encounter (HOSPITAL_COMMUNITY): Payer: Self-pay

## 2014-11-24 DIAGNOSIS — Z91040 Latex allergy status: Secondary | ICD-10-CM | POA: Insufficient documentation

## 2014-11-24 DIAGNOSIS — Z79899 Other long term (current) drug therapy: Secondary | ICD-10-CM | POA: Insufficient documentation

## 2014-11-24 DIAGNOSIS — Z9114 Patient's other noncompliance with medication regimen: Secondary | ICD-10-CM | POA: Insufficient documentation

## 2014-11-24 DIAGNOSIS — Z880 Allergy status to penicillin: Secondary | ICD-10-CM | POA: Insufficient documentation

## 2014-11-24 DIAGNOSIS — Z720 Tobacco use: Secondary | ICD-10-CM | POA: Insufficient documentation

## 2014-11-24 DIAGNOSIS — G8929 Other chronic pain: Secondary | ICD-10-CM | POA: Insufficient documentation

## 2014-11-24 DIAGNOSIS — F419 Anxiety disorder, unspecified: Secondary | ICD-10-CM | POA: Insufficient documentation

## 2014-11-24 DIAGNOSIS — R569 Unspecified convulsions: Secondary | ICD-10-CM

## 2014-11-24 DIAGNOSIS — G40909 Epilepsy, unspecified, not intractable, without status epilepticus: Secondary | ICD-10-CM | POA: Insufficient documentation

## 2014-11-24 LAB — COMPREHENSIVE METABOLIC PANEL
ALT: 14 U/L (ref 14–54)
AST: 20 U/L (ref 15–41)
Albumin: 4 g/dL (ref 3.5–5.0)
Alkaline Phosphatase: 59 U/L (ref 38–126)
Anion gap: 6 (ref 5–15)
BUN: 9 mg/dL (ref 6–20)
CHLORIDE: 107 mmol/L (ref 101–111)
CO2: 24 mmol/L (ref 22–32)
Calcium: 8.3 mg/dL — ABNORMAL LOW (ref 8.9–10.3)
Creatinine, Ser: 0.67 mg/dL (ref 0.44–1.00)
GFR calc non Af Amer: >60 mL/min (ref >60)
Glucose, Bld: 97 mg/dL (ref 65–99)
Potassium: 4.1 mmol/L (ref 3.5–5.1)
SODIUM: 137 mmol/L (ref 135–145)
Total Bilirubin: 0.3 mg/dL (ref 0.3–1.2)
Total Protein: 7.1 g/dL (ref 6.5–8.1)

## 2014-11-24 LAB — CBC WITH DIFFERENTIAL/PLATELET
BASOS PCT: 1 %
Basophils Absolute: 0 10*3/uL (ref 0.0–0.1)
EOS PCT: 1 %
Eosinophils Absolute: 0.1 10*3/uL (ref 0.0–0.7)
HCT: 40.2 % (ref 36.0–46.0)
Hemoglobin: 13.6 g/dL (ref 12.0–15.0)
LYMPHS PCT: 37 %
Lymphs Abs: 2.7 10*3/uL (ref 0.7–4.0)
MCH: 29.2 pg (ref 26.0–34.0)
MCHC: 33.8 g/dL (ref 30.0–36.0)
MCV: 86.5 fL (ref 78.0–100.0)
MONO ABS: 0.5 10*3/uL (ref 0.1–1.0)
Monocytes Relative: 6 %
Neutro Abs: 4.1 10*3/uL (ref 1.7–7.7)
Neutrophils Relative %: 55 %
PLATELETS: 368 10*3/uL (ref 150–400)
RBC: 4.65 MIL/uL (ref 3.87–5.11)
RDW: 14.2 % (ref 11.5–15.5)
WBC: 7.3 10*3/uL (ref 4.0–10.5)

## 2014-11-24 MED ORDER — LEVETIRACETAM IN NACL 500 MG/100ML IV SOLN
500.0000 mg | Freq: Once | INTRAVENOUS | Status: AC
  Administered 2014-11-24: 500 mg via INTRAVENOUS
  Filled 2014-11-24: qty 100

## 2014-11-24 MED ORDER — LORAZEPAM 2 MG/ML IJ SOLN
0.5000 mg | Freq: Once | INTRAMUSCULAR | Status: AC
  Administered 2014-11-24: 0.5 mg via INTRAVENOUS
  Filled 2014-11-24: qty 1

## 2014-11-24 NOTE — ED Notes (Signed)
Pt had another seizure that lasted approximately 15-20 seconds. RN at bedside. Pt's heart rate increased to 130's, O2 sats dropped to 90% on RA. Pt had full body shaking, feet posturing. Immediately afterwards pt was alert and had clear verbal response.

## 2014-11-24 NOTE — ED Provider Notes (Signed)
CSN: [id number]     Arrival date & time 11/24/14  4098 History  By signing my name below, I, Lyndel Safe, attest that this documentation has been prepared under the direction and in the presence of Bethann Berkshire, MD. Electronically Signed: Lyndel Safe, ED Scribe. 11/24/2014. 8:31 AM.   Chief Complaint  Patient presents with  . Seizures   Patient is a 24 y.o. female presenting with seizures. The history is provided by the patient and a significant other. No language interpreter was used.  Seizures Seizure type:  Unable to specify Episode characteristics: eye deviation, generalized shaking and unresponsiveness   Episode characteristics: no incontinence and no tongue biting   Postictal symptoms: no somnolence   Return to baseline: yes   Severity:  Moderate Duration:  20 seconds Timing:  Intermittent Progression:  Unchanged Context: stress   Context: not fever   PTA treatment:  None History of seizures: yes    HPI Comments: Patricia Irwin is a 24 y.o. female, with a PMhx of pseudoseizures, epileptic seizures and anxiety, brought in by ambulance, who presents to the Emergency Department complaining of increased pseudoseizure activity beginning approximately 3 hours ago. Pt reports she began having what she believes to be her pseudoseizures this morning around 5 AM. She is unable to describe the episodes and states she 'feels like she has been hit by a truck' currently. Pt reports this is atypical of her pseudoseizures that she normally experiences in the mornings. Per boyfriend, the pt had 8 consecutive seizure like episodes over the course of an hour; he describes the pt to have been stiff and shaking all over during these episodes.  At baseline, the pt reports 2-3 episodes of pseudoseizures each morning due to PTSD. She is prescribed 2000 mg Keppra for epileptic seizures that she reports occur 2 times per month. The pt takes Keppra dose daily at 9 AM and has not taken this dose yet today.  She was also not given any medication en route. Per nurse, before entering room the pt had an episode of seizure like activity where she became non-responsive with generalized shaking and eyes rolling back in her head that lasted approximately 15-20 seconds. After the pt was able to speak in full sentences and give HPI. The pt takes Klonopin daily for anxiety. She denies bitting her tongue or losing control of bladder or bowels. Pt speaking in full sentences; no slurred speech. No fevers.   Past Medical History  Diagnosis Date  . Chronic knee pain   . Chronic headache   . Noncompliance with medications   . Pseudoseizures   . Anxiety   . Seizures (HCC)   . Hx of electroencephalogram 12/2013 ([facility])    normal, dx non-epileptic seizures   History reviewed. No pertinent past surgical history. Family History  Problem Relation Age of Onset  . Diabetes Father    Social History  Substance Use Topics  . Smoking status: Current Every Day Smoker -- 0.50 packs/day    Types: Cigarettes  . Smokeless tobacco: None  . Alcohol Use: No     Comment: occasional   OB History    Gravida Para Term Preterm AB TAB SAB Ectopic Multiple Living   0              Review of Systems  Constitutional: Negative for fever, appetite change and fatigue.  HENT: Negative for congestion, ear discharge and sinus pressure.   Eyes: Negative for discharge.  Respiratory: Negative for cough.   Cardiovascular:  Negative for chest pain.  Gastrointestinal: Negative for abdominal pain and diarrhea.  Genitourinary: Negative for frequency and hematuria.  Musculoskeletal: Negative for back pain.  Skin: Negative for rash.  Neurological: Positive for seizures. Negative for speech difficulty and headaches.  Psychiatric/Behavioral: Negative for hallucinations.   Allergies  Dilantin; Amoxicillin; Amoxicillin-pot clavulanate; Influenza vaccines; Penicillins; and Latex  Home Medications   Prior to Admission medications    Medication Sig Start Date End Date Taking? Authorizing Provider  clonazePAM (KLONOPIN) 0.5 MG tablet Take 0.5 mg by mouth 3 (three) times daily as needed for anxiety.   Yes Historical Provider, MD  ibuprofen (ADVIL,MOTRIN) 200 MG tablet Take 800 mg by mouth every 6 (six) hours as needed.   Yes Historical Provider, MD  levETIRAcetam (KEPPRA XR) 500 MG 24 hr tablet Take 2 tablets (1,000 mg total) by mouth daily. Patient taking differently: Take 1,000 mg by mouth 2 (two) times daily.  04/07/13  Yes Tammy Triplett, PA-C  metroNIDAZOLE (FLAGYL) 500 MG tablet Take 500 mg by mouth every 8 (eight) hours. For 10 days.   Yes Historical Provider, MD  Vortioxetine HBr (TRINTELLIX) 5 MG TABS Take 5 mg by mouth daily.   Yes Historical Provider, MD   BP 110/87 mmHg  Pulse 69  Temp(Src) 98.4 F (36.9 C)  Resp 20  Ht  (1.6 m)  Wt 160 lb (72.576 kg)  BMI 28.35 kg/m2  SpO2 100%  LMP 11/08/2014 Physical Exam  Constitutional: She is oriented to person, place, and time. She appears well-developed.  HENT:  Head: Normocephalic.  Eyes: Conjunctivae and EOM are normal. No scleral icterus.  Neck: Neck supple. No thyromegaly present.  Cardiovascular: Normal rate and regular rhythm.  Exam reveals no gallop and no friction rub.   No murmur heard. Pulmonary/Chest: No stridor. She has no wheezes. She has no rales. She exhibits no tenderness.  Abdominal: She exhibits no distension. There is no tenderness. There is no rebound.  Musculoskeletal: Normal range of motion. She exhibits no edema.  Lymphadenopathy:    She has no cervical adenopathy.  Neurological: She is oriented to person, place, and time. She exhibits normal muscle tone. Coordination normal.  Skin: No rash noted. No erythema.  Psychiatric: She has a normal mood and affect. Her behavior is normal.    ED Course  Procedures  DIAGNOSTIC STUDIES: Oxygen Saturation is 100% on RA, normal by my interpretation.    COORDINATION OF CARE: 8:15 AM  Discussed treatment plan with pt at bedside and pt agreed to plan.   Labs Review Labs Reviewed  COMPREHENSIVE METABOLIC PANEL - Abnormal; Notable for the following:    Calcium 8.3 (*)    All other components within normal limits  CBC WITH DIFFERENTIAL/PLATELET   I have personally reviewed and evaluated these lab results as part of my medical decision-making.   EKG Interpretation   Date/Time:  Saturday November 24 2014 07:59:21 EDT Ventricular Rate:  68 PR Interval:  126 QRS Duration: 80 QT Interval:  403 QTC Calculation: 429 R Axis:   80 Text Interpretation:  Sinus rhythm ED PHYSICIAN INTERPRETATION AVAILABLE  IN [facility] Confirmed by TEST, Record ([id number]) on 11/24/2014 8:39:10  AM      MDM   Final diagnoses:  None    History of seizures and pseudoseizures. Patient had a seizure today. Patient has been stable in the emergency department. No witnessed seizures here in the emergency department. I am not sure if it was a seizure or a pseudoseizure. Patient was given  500 mg of Keppra in addition toward normal medicine. Labs unremarkable patient will follow-up with her doctor as needed   Bethann Berkshire, MD 11/24/14 1352

## 2014-11-24 NOTE — Discharge Instructions (Signed)
Follow up with your md next week. °

## 2014-11-24 NOTE — ED Notes (Signed)
Per ems, at 0630 pt started having seizures.  Reports had a string of 8 seizures 3 min apart and lasted approx 1 min each.  Reports history of seizures.  Pt says she thinks the seizures she had were from her ptsd.  Also says she is taking flagyl and since then has been nauseated and itching.

## 2014-11-24 NOTE — ED Notes (Signed)
MD at bedside. 

## 2014-11-24 NOTE — ED Notes (Signed)
Pt stopped verbally responding to my questions. When pt was asked if she felt like she was getting ready to have a seizure she nodded her head yes. Pt began to violently shake, eyes rolled back, feet postured, HR rose to 140's, O2 sats decreased slightly to 92% while on RA. Seizure lasted approximately 15-20 seconds. Immediately afterwards pt was alert and responsive, talking normally, no slurred speech, pt stated "I feel like a truck hit me".

## 2014-11-25 ENCOUNTER — Emergency Department (HOSPITAL_COMMUNITY)
Admission: EM | Admit: 2014-11-25 | Discharge: 2014-11-25 | Disposition: A | Discharge status: Home / Self Care | Payer: Medicaid Other | Attending: Emergency Medicine | Admitting: Emergency Medicine

## 2014-11-25 ENCOUNTER — Encounter (HOSPITAL_COMMUNITY): Payer: Self-pay | Admitting: Emergency Medicine

## 2014-11-25 DIAGNOSIS — R569 Unspecified convulsions: Secondary | ICD-10-CM

## 2014-11-25 DIAGNOSIS — Z880 Allergy status to penicillin: Secondary | ICD-10-CM | POA: Insufficient documentation

## 2014-11-25 DIAGNOSIS — G8929 Other chronic pain: Secondary | ICD-10-CM | POA: Insufficient documentation

## 2014-11-25 DIAGNOSIS — Z91040 Latex allergy status: Secondary | ICD-10-CM | POA: Insufficient documentation

## 2014-11-25 DIAGNOSIS — Z720 Tobacco use: Secondary | ICD-10-CM | POA: Insufficient documentation

## 2014-11-25 DIAGNOSIS — Z79899 Other long term (current) drug therapy: Secondary | ICD-10-CM | POA: Insufficient documentation

## 2014-11-25 DIAGNOSIS — F445 Conversion disorder with seizures or convulsions: Secondary | ICD-10-CM | POA: Insufficient documentation

## 2014-11-25 DIAGNOSIS — F419 Anxiety disorder, unspecified: Secondary | ICD-10-CM | POA: Insufficient documentation

## 2014-11-25 DIAGNOSIS — Z9119 Patient's noncompliance with other medical treatment and regimen: Secondary | ICD-10-CM | POA: Insufficient documentation

## 2014-11-25 NOTE — Discharge Instructions (Signed)
No tests are necessary today. Follow-up your psychiatrist and neurologist.

## 2014-11-25 NOTE — ED Notes (Signed)
Pt alert and oriented and communicating well with staff. Pt states she feels like she is at her baseline.

## 2014-11-25 NOTE — ED Provider Notes (Signed)
CSN: [id number]     Arrival date & time 11/25/14  1719 History   First MD Initiated Contact with Patient 11/25/14 1721     Chief Complaint  Patient presents with  . Seizures     (Consider location/radiation/quality/duration/timing/severity/associated sxs/prior Treatment) HPI.... Patient was in triage area checking in with her boyfriend when she allegedly had a shaking episode for 30 seconds. Immediately after episode, patient was alert and 3 without neurological deficits. She has a history of both seizures and pseudoseizures. She sees a neurologist and psychiatrist for same. Past medical history includes PTSD. Medications Keppra. No prodromal illnesses. No fever, sweats, chills, neuro deficits, stiff neck.  Past Medical History  Diagnosis Date  . Chronic knee pain   . Chronic headache   . Noncompliance with medications   . Pseudoseizures   . Anxiety   . Seizures (HCC)   . Hx of electroencephalogram 12/2013 ([facility])    normal, dx non-epileptic seizures   History reviewed. No pertinent past surgical history. Family History  Problem Relation Age of Onset  . Diabetes Father    Social History  Substance Use Topics  . Smoking status: Current Every Day Smoker -- 0.50 packs/day    Types: Cigarettes  . Smokeless tobacco: None  . Alcohol Use: No     Comment: occasional   OB History    Gravida Para Term Preterm AB TAB SAB Ectopic Multiple Living   0              Review of Systems  All other systems reviewed and are negative.     Allergies  Dilantin; Amoxicillin; Amoxicillin-pot clavulanate; Influenza vaccines; Metronidazole; Penicillins; and Latex  Home Medications   Prior to Admission medications   Medication Sig Start Date End Date Taking? Authorizing Provider  clonazePAM (KLONOPIN) 0.5 MG tablet Take 0.5 mg by mouth 3 (three) times daily.    Yes Historical Provider, MD  ibuprofen (ADVIL,MOTRIN) 200 MG tablet Take 800 mg by mouth every 6 (six) hours as needed.   Yes  Historical Provider, MD  levETIRAcetam (KEPPRA XR) 500 MG 24 hr tablet Take 2 tablets (1,000 mg total) by mouth daily. Patient taking differently: Take 2,000 mg by mouth daily.  04/07/13  Yes Tammy Triplett, PA-C  Vortioxetine HBr (TRINTELLIX) 5 MG TABS Take 5 mg by mouth daily.   Yes Historical Provider, MD  metroNIDAZOLE (FLAGYL) 500 MG tablet Take 500 mg by mouth every 8 (eight) hours. For 10 days.    Historical Provider, MD   BP 109/76 mmHg  Pulse 76  Temp(Src) 98.4 F (36.9 C) (Oral)  Resp 24  Ht  (1.6 m)  Wt 160 lb (72.576 kg)  BMI 28.35 kg/m2  SpO2 97%  LMP 11/08/2014 Physical Exam  Constitutional: She is oriented to person, place, and time. She appears well-developed and well-nourished.  HENT:  Head: Normocephalic and atraumatic.  Eyes: Conjunctivae and EOM are normal. Pupils are equal, round, and reactive to light.  Neck: Normal range of motion. Neck supple.  Cardiovascular: Normal rate and regular rhythm.   Pulmonary/Chest: Effort normal and breath sounds normal.  Abdominal: Soft. Bowel sounds are normal.  Musculoskeletal: Normal range of motion.  Neurological: She is alert and oriented to person, place, and time.  Skin: Skin is warm and dry.  Psychiatric: She has a normal mood and affect. Her behavior is normal.  Nursing note and vitals reviewed.   ED Course  Procedures (including critical care time) Labs Review Labs Reviewed - No data to  display  Imaging Review No results found. I have personally reviewed and evaluated these images and lab results as part of my medical decision-making.   EKG Interpretation None      MDM   Final diagnoses:  Pseudoseizures    Normal physical exam. No evidence of seizure activity in the emergency department.  Patient has close follow-up for her medical issues.    Donnetta Hutching, MD 11/25/14 2218

## 2014-11-25 NOTE — ED Notes (Signed)
PT was with boyfriend in triage and told this triage nurse she was going to have a seizure. PT observed to lay backwards on the bed and shaking all over for approximately 30sec. PT now alert and oriented.

## 2014-11-25 NOTE — ED Notes (Signed)
MD at bedside. 

## 2014-12-11 ENCOUNTER — Encounter (HOSPITAL_COMMUNITY): Payer: Self-pay | Admitting: Emergency Medicine

## 2014-12-11 ENCOUNTER — Emergency Department (HOSPITAL_COMMUNITY)
Admission: EM | Admit: 2014-12-11 | Discharge: 2014-12-12 | Disposition: A | Discharge status: Home / Self Care | Payer: Medicaid Other | Attending: Emergency Medicine | Admitting: Emergency Medicine

## 2014-12-11 DIAGNOSIS — Z720 Tobacco use: Secondary | ICD-10-CM | POA: Insufficient documentation

## 2014-12-11 DIAGNOSIS — M791 Myalgia: Secondary | ICD-10-CM | POA: Insufficient documentation

## 2014-12-11 DIAGNOSIS — R51 Headache: Secondary | ICD-10-CM | POA: Insufficient documentation

## 2014-12-11 DIAGNOSIS — G40909 Epilepsy, unspecified, not intractable, without status epilepticus: Secondary | ICD-10-CM | POA: Insufficient documentation

## 2014-12-11 DIAGNOSIS — Z79899 Other long term (current) drug therapy: Secondary | ICD-10-CM | POA: Insufficient documentation

## 2014-12-11 DIAGNOSIS — Z91040 Latex allergy status: Secondary | ICD-10-CM | POA: Insufficient documentation

## 2014-12-11 DIAGNOSIS — F419 Anxiety disorder, unspecified: Secondary | ICD-10-CM | POA: Insufficient documentation

## 2014-12-11 DIAGNOSIS — F445 Conversion disorder with seizures or convulsions: Secondary | ICD-10-CM

## 2014-12-11 DIAGNOSIS — Z880 Allergy status to penicillin: Secondary | ICD-10-CM | POA: Insufficient documentation

## 2014-12-11 DIAGNOSIS — G8929 Other chronic pain: Secondary | ICD-10-CM | POA: Insufficient documentation

## 2014-12-11 DIAGNOSIS — R569 Unspecified convulsions: Secondary | ICD-10-CM

## 2014-12-11 DIAGNOSIS — Z9114 Patient's other noncompliance with medication regimen: Secondary | ICD-10-CM | POA: Insufficient documentation

## 2014-12-11 NOTE — ED Provider Notes (Signed)
CSN: [id number]     Arrival date & time 12/11/14  2339 History  By signing my name below, I, Patricia Irwin, attest that this documentation has been prepared under the direction and in the presence of Patricia AlbeIva Ariyanna Oien, MD at 0020. Electronically Signed: Budd PalmerVanessa Irwin, ED Scribe. 12/12/2014. 12:38 AM   Chief Complaint  Patient presents with  . Seizures   The history is provided by the patient and a relative. No language interpreter was used.   HPI Comments: Patricia Irwin is a 24 y.o. female who presents to the Emergency Department complaining of seizures onset 3 hours ago. Per significant other, pt had 3 psychosomatic seizures back to back around 8 pm from which she recovered normally, but then had another,  Followed by anepileptic seizure from which she did not recover as she normally does around 10:30 pm. He notes pt had erratic breathing, which is why he called EMS. He notes the pt was red in the face, and was unresponsive until EMS administered ammonia salts. Pt states she does not remember having any seizures. She states she has been taking her medication and has taken all of her medication for today. Per relative pt was out of the Kepra for 3-4 days while waiting on the pharmacy to refill the prescription. She notes she has been taking the medication regularly for the past 2 weeks. Pt states she has been feeling stressed for the past few days because they are having trouble paying the electric bill. She reports associated jaw pain and diffuse HA.   Past Medical History  Diagnosis Date  . Chronic knee pain   . Chronic headache   . Noncompliance with medications   . Pseudoseizures   . Anxiety   . Seizures (HCC)   . Hx of electroencephalogram 12/2013 ([facility])    normal, dx non-epileptic seizures   History reviewed. No pertinent past surgical history. Family History  Problem Relation Age of Onset  . Diabetes Father    Social History  Substance Use Topics  . Smoking status: Current Every Day  Smoker -- 0.50 packs/day    Types: Cigarettes  . Smokeless tobacco: None  . Alcohol Use: No     Comment: occasional  lives at home  OB History    Gravida Para Term Preterm AB TAB SAB Ectopic Multiple Living   0              Review of Systems  Musculoskeletal: Positive for myalgias.  Neurological: Positive for seizures and headaches.  All other systems reviewed and are negative.     Allergies  Dilantin; Amoxicillin; Amoxicillin-pot clavulanate; Influenza vaccines; Metronidazole; Penicillins; Latex; and Trintellix  Home Medications   Prior to Admission medications   Medication Sig Start Date End Date Taking? Authorizing Provider  clonazePAM (KLONOPIN) 0.5 MG tablet Take 0.5 mg by mouth 3 (three) times daily.    Yes Historical Provider, MD  ibuprofen (ADVIL,MOTRIN) 200 MG tablet Take 800 mg by mouth every 6 (six) hours as needed.   Yes Historical Provider, MD  levETIRAcetam (KEPPRA XR) 500 MG 24 hr tablet Take 2 tablets (1,000 mg total) by mouth daily. Patient taking differently: Take 2,000 mg by mouth daily.  04/07/13  Yes Tammy Triplett, PA-C  metroNIDAZOLE (FLAGYL) 500 MG tablet Take 500 mg by mouth every 8 (eight) hours. For 10 days.    Historical Provider, MD  Vortioxetine HBr (TRINTELLIX) 5 MG TABS Take 5 mg by mouth daily.    Historical Provider, MD   BP  114/89 mmHg  Pulse 65  Temp(Src) 98.3 F (36.8 C) (Oral)  Resp 18  Ht  (1.6 m)  Wt 160 lb (72.576 kg)  BMI 28.35 kg/m2  SpO2 99%  LMP 12/11/2014  Vital signs normal   Physical Exam  Constitutional: She is oriented to person, place, and time. She appears well-developed and well-nourished.  Non-toxic appearance. She does not appear ill. No distress.  HENT:  Head: Normocephalic and atraumatic.  Right Ear: External ear normal.  Left Ear: External ear normal.  Nose: Nose normal. No mucosal edema or rhinorrhea.  Mouth/Throat: Oropharynx is clear and moist and mucous membranes are normal. No dental abscesses or  uvula swelling.  No trauma to the tongue, good ROM of the jaw  Eyes: Conjunctivae and EOM are normal. Pupils are equal, round, and reactive to light.  Neck: Normal range of motion and full passive range of motion without pain. Neck supple.  Cardiovascular: Normal rate, regular rhythm and normal heart sounds.  Exam reveals no gallop and no friction rub.   No murmur heard. Pulmonary/Chest: Effort normal and breath sounds normal. No respiratory distress. She has no wheezes. She has no rhonchi. She has no rales. She exhibits no tenderness and no crepitus.  Abdominal: Soft. Normal appearance and bowel sounds are normal. She exhibits no distension. There is no tenderness. There is no rebound and no guarding.  Musculoskeletal: Normal range of motion. She exhibits no edema or tenderness.  Moves all extremities well.   Neurological: She is alert and oriented to person, place, and time. She has normal strength. No cranial nerve deficit.  Skin: Skin is warm, dry and intact. No rash noted. No erythema. No pallor.  Psychiatric: She has a normal mood and affect. Her speech is normal and behavior is normal. Her mood appears not anxious.  Nursing note and vitals reviewed.   ED Course  Procedures   Medications  acetaminophen (TYLENOL) tablet 1,000 mg (1,000 mg Oral Given 12/12/14 0042)    DIAGNOSTIC STUDIES: Oxygen Saturation is 99% on RA, normal by my interpretation.    COORDINATION OF CARE: 12:35 AM - Discussed plans to observe. Pt advised of plan for treatment and pt agrees. Patient was given acetaminophen for headache  Recheck at time of discharge. Patient states she feels much better. She has had no further episodes while in the ED. Her breathing has appeared normal. Her significant other and friend states they feel comfortable taking her home at this point. They state their concern had been her erratic breathing after the second episode tonight which lasted longer than usual.   MDM  patient has  history of seizure disorder and pseudoseizures. She does acknowledge a lot of stress due to a electric bill they are having trouble pain. She has had more pseudoseizures today. She is taking her Keppra as prescribed. Patient was observed in the ED until her friends felt comfortable taking her home.    Final diagnoses:  Pseudoseizures  Seizure disorder [facility])   Plan discharge  Patricia Albe, MD, FACEP   I personally performed the services described in this documentation, which was scribed in my presence. The recorded information has been reviewed and considered.  Patricia Albe, MD, Concha Pyo, MD 12/12/14 [phone]

## 2014-12-11 NOTE — ED Notes (Signed)
Pt unwitnessed seizure while in bed. Pt is very lethargic at this time, but oriented x 4.

## 2014-12-12 MED ORDER — ACETAMINOPHEN 500 MG PO TABS
1000.0000 mg | ORAL_TABLET | Freq: Once | ORAL | Status: AC
  Administered 2014-12-12: 1000 mg via ORAL
  Filled 2014-12-12: qty 2

## 2014-12-12 NOTE — ED Notes (Signed)
Discharge instructions given, pt demonstrated teach back and verbal understanding. No concerns voiced.  

## 2014-12-12 NOTE — Discharge Instructions (Signed)
Continue your seizure medication. Recheck as needed.

## 2015-01-18 ENCOUNTER — Encounter (HOSPITAL_COMMUNITY): Payer: Self-pay

## 2015-01-18 ENCOUNTER — Emergency Department (HOSPITAL_COMMUNITY)
Admission: EM | Admit: 2015-01-18 | Discharge: 2015-01-19 | Disposition: A | Discharge status: Home / Self Care | Payer: Medicaid Other | Attending: Emergency Medicine | Admitting: Emergency Medicine

## 2015-01-18 DIAGNOSIS — F17210 Nicotine dependence, cigarettes, uncomplicated: Secondary | ICD-10-CM | POA: Insufficient documentation

## 2015-01-18 DIAGNOSIS — S61212A Laceration without foreign body of right middle finger without damage to nail, initial encounter: Secondary | ICD-10-CM | POA: Insufficient documentation

## 2015-01-18 DIAGNOSIS — Z880 Allergy status to penicillin: Secondary | ICD-10-CM | POA: Insufficient documentation

## 2015-01-18 DIAGNOSIS — R42 Dizziness and giddiness: Secondary | ICD-10-CM | POA: Insufficient documentation

## 2015-01-18 DIAGNOSIS — Z91040 Latex allergy status: Secondary | ICD-10-CM | POA: Insufficient documentation

## 2015-01-18 DIAGNOSIS — Z9119 Patient's noncompliance with other medical treatment and regimen: Secondary | ICD-10-CM | POA: Insufficient documentation

## 2015-01-18 DIAGNOSIS — Y998 Other external cause status: Secondary | ICD-10-CM | POA: Insufficient documentation

## 2015-01-18 DIAGNOSIS — F445 Conversion disorder with seizures or convulsions: Secondary | ICD-10-CM

## 2015-01-18 DIAGNOSIS — S61210A Laceration without foreign body of right index finger without damage to nail, initial encounter: Secondary | ICD-10-CM | POA: Insufficient documentation

## 2015-01-18 DIAGNOSIS — S61214A Laceration without foreign body of right ring finger without damage to nail, initial encounter: Secondary | ICD-10-CM

## 2015-01-18 DIAGNOSIS — Y9289 Other specified places as the place of occurrence of the external cause: Secondary | ICD-10-CM | POA: Insufficient documentation

## 2015-01-18 DIAGNOSIS — Z79899 Other long term (current) drug therapy: Secondary | ICD-10-CM | POA: Insufficient documentation

## 2015-01-18 DIAGNOSIS — Y9389 Activity, other specified: Secondary | ICD-10-CM | POA: Insufficient documentation

## 2015-01-18 DIAGNOSIS — G8929 Other chronic pain: Secondary | ICD-10-CM | POA: Insufficient documentation

## 2015-01-18 DIAGNOSIS — R110 Nausea: Secondary | ICD-10-CM | POA: Insufficient documentation

## 2015-01-18 DIAGNOSIS — Z3202 Encounter for pregnancy test, result negative: Secondary | ICD-10-CM | POA: Insufficient documentation

## 2015-01-18 DIAGNOSIS — W260XXA Contact with knife, initial encounter: Secondary | ICD-10-CM | POA: Insufficient documentation

## 2015-01-18 DIAGNOSIS — F419 Anxiety disorder, unspecified: Secondary | ICD-10-CM | POA: Insufficient documentation

## 2015-01-18 DIAGNOSIS — R569 Unspecified convulsions: Secondary | ICD-10-CM | POA: Insufficient documentation

## 2015-01-18 MED ORDER — DOXYCYCLINE HYCLATE 100 MG PO TABS
100.0000 mg | ORAL_TABLET | Freq: Once | ORAL | Status: AC
  Administered 2015-01-18: 100 mg via ORAL
  Filled 2015-01-18: qty 1

## 2015-01-18 MED ORDER — OXYCODONE-ACETAMINOPHEN 5-325 MG PO TABS
1.0000 | ORAL_TABLET | Freq: Once | ORAL | Status: AC
  Administered 2015-01-18: 1 via ORAL
  Filled 2015-01-18: qty 1

## 2015-01-18 MED ORDER — ONDANSETRON 8 MG PO TBDP
8.0000 mg | ORAL_TABLET | Freq: Once | ORAL | Status: AC
  Administered 2015-01-18: 8 mg via ORAL
  Filled 2015-01-18: qty 1

## 2015-01-18 NOTE — ED Notes (Signed)
Pt initially here for finger laceration, but while registering pt slumped in chair and appeared to have ?? Seizure while sitting in the chair.  Pt appeared to be unresponsive but then was wide awake and alert, oriented, talking complete sentences.  Per family member, pt has "psychogenic seizures"

## 2015-01-18 NOTE — ED Provider Notes (Signed)
CSN: [id number]     Arrival date & time 01/18/15  2300 History  By signing my name below, I, Doreatha Martin, attest that this documentation has been prepared under the direction and in the presence of Dione Booze, MD. Electronically Signed: Doreatha Martin, ED Scribe. 01/18/2015. 11:43 PM.    Chief Complaint  Patient presents with  . Seizures   The history is provided by the patient. No language interpreter was used.   HPI Comments: Patricia Irwin is a 24 y.o. female with h/o pseudoseizures, psychogenic seizures, noncompliance with medications who presents to the Emergency Department complaining of a laceration with controlled bleeding to the right middle finger that occurred last night with associated green drainage. Pt states she accidentally cut herself with a knife last night and controlled the bleeding with a bandage. She states she woke up this morning and noticed color change and increased pain, so she soaked the finger in peroxide and cleaned and dressed the wound. Pt also had a witnessed seizure in the waiting room and reports she had a typical aura beforehand. She is complaining of nausea and dizziness s/p seizure. She notes this seizure was typical of her past seizures triggered by pain. Her seizures are controlled with medications.   Past Medical History  Diagnosis Date  . Chronic knee pain   . Chronic headache   . Noncompliance with medications   . Pseudoseizures   . Anxiety   . Seizures (HCC)   . Hx of electroencephalogram 12/2013 ([facility])    normal, dx non-epileptic seizures   History reviewed. No pertinent past surgical history. Family History  Problem Relation Age of Onset  . Diabetes Father    Social History  Substance Use Topics  . Smoking status: Current Every Day Smoker -- 0.50 packs/day    Types: Cigarettes  . Smokeless tobacco: None  . Alcohol Use: No     Comment: occasional   OB History    Gravida Para Term Preterm AB TAB SAB Ectopic Multiple Living   0               Review of Systems  Gastrointestinal: Positive for nausea.  Skin: Positive for wound.  Neurological: Positive for dizziness and seizures.  All other systems reviewed and are negative.  Allergies  Dilantin; Amoxicillin; Amoxicillin-pot clavulanate; Influenza vaccines; Metronidazole; Penicillins; Latex; and Trintellix  Home Medications   Prior to Admission medications   Medication Sig Start Date End Date Taking? Authorizing Provider  clonazePAM (KLONOPIN) 0.5 MG tablet Take 0.5 mg by mouth 3 (three) times daily.    Yes Historical Provider, MD  escitalopram (LEXAPRO) 10 MG tablet Take 10 mg by mouth daily.   Yes Historical Provider, MD  levETIRAcetam (KEPPRA XR) 500 MG 24 hr tablet Take 2 tablets (1,000 mg total) by mouth daily. Patient taking differently: Take 2,000 mg by mouth daily.  04/07/13  Yes Tammy Triplett, PA-C  ibuprofen (ADVIL,MOTRIN) 200 MG tablet Take 800 mg by mouth every 6 (six) hours as needed.    Historical Provider, MD  metroNIDAZOLE (FLAGYL) 500 MG tablet Take 500 mg by mouth every 8 (eight) hours. For 10 days.    Historical Provider, MD  Vortioxetine HBr (TRINTELLIX) 5 MG TABS Take 5 mg by mouth daily.    Historical Provider, MD   LMP 12/08/2014 Physical Exam  Constitutional: She is oriented to person, place, and time. She appears well-developed and well-nourished.  HENT:  Head: Normocephalic and atraumatic.  Eyes: Conjunctivae and EOM are normal. Pupils are  equal, round, and reactive to light.  Neck: Normal range of motion. Neck supple. No JVD present.  Cardiovascular: Normal rate, regular rhythm and normal heart sounds.   No murmur heard. Pulmonary/Chest: Effort normal and breath sounds normal. She has no wheezes. She has no rales. She exhibits no tenderness.  Abdominal: Soft. Bowel sounds are normal. She exhibits no distension and no mass. There is no tenderness.  Musculoskeletal: Normal range of motion. She exhibits no edema.  5mm laceration on the pad of  the right second finger. No gaping. No erythema or drainage. 1cm laceration middle phalanx of right 3rd finger. Slight gaping of skin edges. No active bleeding. No erythema and no drainage. neurovascular and tendon function normal.   Lymphadenopathy:    She has no cervical adenopathy.  Neurological: She is alert and oriented to person, place, and time. No cranial nerve deficit. She exhibits normal muscle tone. Coordination normal.  Skin: Skin is warm and dry. No rash noted.  Psychiatric: She has a normal mood and affect. Her behavior is normal. Judgment and thought content normal.  Nursing note and vitals reviewed.  ED Course  Procedures (including critical care time) DIAGNOSTIC STUDIES: Oxygen Saturation is 100% on RA, normal by my interpretation.    COORDINATION OF CARE: 11:39 PM Discussed treatment plan with pt at bedside and pt agreed to plan.  Labs Review Results for orders placed or performed during the hospital encounter of 01/18/15  POC urine preg, ED  Result Value Ref Range   Preg Test, Ur NEGATIVE NEGATIVE   I have personally reviewed and evaluated these lab results as part of my medical decision-making.     EKG Interpretation   Date/Time:  Friday January 18 2015 23:10:10 EST Ventricular Rate:  82 PR Interval:  130 QRS Duration: 89 QT Interval:  384 QTC Calculation: 448 R Axis:   88 Text Interpretation:  Sinus rhythm No significant change was found  Confirmed by Manus GunningANCOUR  MD, STEPHEN [phone]([id number]) on 01/18/2015 11:12:44 PM      MDM   Final diagnoses:  Laceration of third finger, right, initial encounter  Pseudoseizure (HCC)    Lacerations of right second and third fingers. Injury occurred greater than 24 hours ago and there is some period and drainage which would be contraindications to suture closure or tissue adhesive use. This is explained to patient. She has known history of pseudoseizures and apparently had a pseudoseizure at triage. No workup is indicated as  this has been thoroughly evaluated in the past. She is has a dressing placed and finger splint is applied.to help protect the area appeared she is to keep this dressing in place for 2 days and then resume routine wound care. She started on antibiotic of doxycycline. She expresses concern about possible pregnancy since she is 2 weeks late on her menses. Pregnancy test is negative.  I personally performed the services described in this documentation, which was scribed in my presence. The recorded information has been reviewed and is accurate.      Dione Boozeavid Keisy Strickler, MD 01/19/15 0040

## 2015-01-19 LAB — POC URINE PREG, ED: Preg Test, Ur: NEGATIVE

## 2015-01-19 MED ORDER — DOXYCYCLINE HYCLATE 100 MG PO CAPS: 100.0000 mg | ORAL_CAPSULE | Freq: Two times a day (BID) | ORAL | Status: DC

## 2015-01-19 NOTE — ED Notes (Signed)
Patient verbalizes understanding of discharge instructions, prescription medications, home care and follow up care. Patient ambulatory out of department at this time with family. 

## 2015-01-19 NOTE — Discharge Instructions (Signed)
Keep the dressing on for two days. Wear the splint for those two days, then as needed. Take ibuprofen or acetaminophen as needed for pain.   Laceration Care, Adult A laceration is a cut that goes through all of the layers of the skin and into the tissue that is right under the skin. Some lacerations heal on their own. Others need to be closed with stitches (sutures), staples, skin adhesive strips, or skin glue. Proper laceration care minimizes the risk of infection and helps the laceration to heal better. HOW TO CARE FOR YOUR LACERATION General Instructions  Take over-the-counter and prescription medicines only as told by your health care provider.  If you were prescribed an antibiotic medicine or ointment, take or apply it as told by your doctor. Do not stop using it even if your condition improves.  To help prevent scarring, make sure to cover your wound with sunscreen whenever you are outside after stitches are removed, after adhesive strips are removed, or when glue remains in place and the wound is healed. Make sure to wear a sunscreen of at least 30 SPF.  Do not scratch or pick at the wound.  Keep all follow-up visits as told by your health care provider. This is important.  Check your wound every day for signs of infection. Watch for:  Redness, swelling, or pain.  Fluid, blood, or pus.  Raise (elevate) the injured area above the level of your heart while you are sitting or lying down, if possible. SEEK MEDICAL CARE IF:  You received a tetanus shot and you have swelling, severe pain, redness, or bleeding at the injection site.  You have a fever.  A wound that was closed breaks open.  You notice a bad smell coming from your wound or your dressing.  You notice something coming out of the wound, such as wood or glass.  Your pain is not controlled with medicine.  You have increased redness, swelling, or pain at the site of your wound.  You have fluid, blood, or pus coming  from your wound.  You notice a change in the color of your skin near your wound.  You need to change the dressing frequently due to fluid, blood, or pus draining from the wound.  You develop a new rash.  You develop numbness around the wound. SEEK IMMEDIATE MEDICAL CARE IF:  You develop severe swelling around the wound.  Your pain suddenly increases and is severe.  You develop painful lumps near the wound or on skin that is anywhere on your body.  You have a red streak going away from your wound.  The wound is on your hand or foot and you cannot properly move a finger or toe.  The wound is on your hand or foot and you notice that your fingers or toes look pale or bluish.   This information is not intended to replace advice given to you by your health care provider. Make sure you discuss any questions you have with your health care provider.   Document Released: 02/09/2005 Document Revised: 06/26/2014 Document Reviewed: 02/05/2014 Elsevier Interactive Patient Education 2016 [employer]  Doxycycline tablets or capsules What is this medicine? DOXYCYCLINE (dox i SYE kleen) is a tetracycline antibiotic. It kills certain bacteria or stops their growth. It is used to treat many kinds of infections, like dental, skin, respiratory, and urinary tract infections. It also treats acne, Lyme disease, malaria, and certain sexually transmitted infections. This medicine may be used for other purposes; ask  your health care provider or pharmacist if you have questions. What should I tell my health care provider before I take this medicine? They need to know if you have any of these conditions: -liver disease -long exposure to sunlight like working outdoors -stomach problems like colitis -an unusual or allergic reaction to doxycycline, tetracycline antibiotics, other medicines, foods, dyes, or preservatives -pregnant or trying to get pregnant -breast-feeding How should I use this  medicine? Take this medicine by mouth with a full glass of water. Follow the directions on the prescription label. It is best to take this medicine without food, but if it upsets your stomach take it with food. Take your medicine at regular intervals. Do not take your medicine more often than directed. Take all of your medicine as directed even if you think you are better. Do not skip doses or stop your medicine early. Talk to your pediatrician regarding the use of this medicine in children. While this drug may be prescribed for selected conditions, precautions do apply. Overdosage: If you think you have taken too much of this medicine contact a poison control center or emergency room at once. NOTE: This medicine is only for you. Do not share this medicine with others. What if I miss a dose? If you miss a dose, take it as soon as you can. If it is almost time for your next dose, take only that dose. Do not take double or extra doses. What may interact with this medicine? -antacids -barbiturates -birth control pills -bismuth subsalicylate -carbamazepine -methoxyflurane -other antibiotics -phenytoin -vitamins that contain iron -warfarin This list may not describe all possible interactions. Give your health care provider a list of all the medicines, herbs, non-prescription drugs, or dietary supplements you use. Also tell them if you smoke, drink alcohol, or use illegal drugs. Some items may interact with your medicine. What should I watch for while using this medicine? Tell your doctor or health care professional if your symptoms do not improve. Do not treat diarrhea with over the counter products. Contact your doctor if you have diarrhea that lasts more than 2 days or if it is severe and watery. Do not take this medicine just before going to bed. It may not dissolve properly when you lay down and can cause pain in your throat. Drink plenty of fluids while taking this medicine to also help reduce  irritation in your throat. This medicine can make you more sensitive to the sun. Keep out of the sun. If you cannot avoid being in the sun, wear protective clothing and use sunscreen. Do not use sun lamps or tanning beds/booths. Birth control pills may not work properly while you are taking this medicine. Talk to your doctor about using an extra method of birth control. If you are being treated for a sexually transmitted infection, avoid sexual contact until you have finished your treatment. Your sexual partner may also need treatment. Avoid antacids, aluminum, calcium, magnesium, and iron products for 4 hours before and 2 hours after taking a dose of this medicine. If you are using this medicine to prevent malaria, you should still protect yourself from contact with mosquitos. Stay in screened-in areas, use mosquito nets, keep your body covered, and use an insect repellent. What side effects may I notice from receiving this medicine? Side effects that you should report to your doctor or health care professional as soon as possible: -allergic reactions like skin rash, itching or hives, swelling of the face, lips, or tongue -difficulty breathing -  fever -itching in the rectal or genital area -pain on swallowing -redness, blistering, peeling or loosening of the skin, including inside the mouth -severe stomach pain or cramps -unusual bleeding or bruising -unusually weak or tired -yellowing of the eyes or skin Side effects that usually do not require medical attention (report to your doctor or health care professional if they continue or are bothersome): -diarrhea -loss of appetite -nausea, vomiting This list may not describe all possible side effects. Call your doctor for medical advice about side effects. You may report side effects to FDA at 1-800-FDA-1088. Where should I keep my medicine? Keep out of the reach of children. Store at room temperature, below 30 degrees C (86 degrees F). Protect  from light. Keep container tightly closed. Throw away any unused medicine after the expiration date. Taking this medicine after the expiration date can make you seriously ill. NOTE: This sheet is a summary. It may not cover all possible information. If you have questions about this medicine, talk to your doctor, pharmacist, or health care provider.    2016, Elsevier/[employer]. (2014-06-01 12:10:28)

## 2015-01-29 ENCOUNTER — Emergency Department (HOSPITAL_COMMUNITY)
Admission: EM | Admit: 2015-01-29 | Discharge: 2015-01-30 | Disposition: A | Discharge status: Home / Self Care | Payer: Medicaid Other | Attending: Emergency Medicine | Admitting: Emergency Medicine

## 2015-01-29 ENCOUNTER — Encounter (HOSPITAL_COMMUNITY): Payer: Self-pay | Admitting: *Deleted

## 2015-01-29 DIAGNOSIS — F17210 Nicotine dependence, cigarettes, uncomplicated: Secondary | ICD-10-CM | POA: Insufficient documentation

## 2015-01-29 DIAGNOSIS — M546 Pain in thoracic spine: Secondary | ICD-10-CM | POA: Insufficient documentation

## 2015-01-29 DIAGNOSIS — M791 Myalgia: Secondary | ICD-10-CM | POA: Insufficient documentation

## 2015-01-29 DIAGNOSIS — R569 Unspecified convulsions: Secondary | ICD-10-CM | POA: Insufficient documentation

## 2015-01-29 DIAGNOSIS — Z91040 Latex allergy status: Secondary | ICD-10-CM | POA: Insufficient documentation

## 2015-01-29 DIAGNOSIS — F419 Anxiety disorder, unspecified: Secondary | ICD-10-CM | POA: Insufficient documentation

## 2015-01-29 DIAGNOSIS — M549 Dorsalgia, unspecified: Secondary | ICD-10-CM

## 2015-01-29 DIAGNOSIS — G8929 Other chronic pain: Secondary | ICD-10-CM | POA: Insufficient documentation

## 2015-01-29 DIAGNOSIS — Z79899 Other long term (current) drug therapy: Secondary | ICD-10-CM | POA: Insufficient documentation

## 2015-01-29 DIAGNOSIS — Z9114 Patient's other noncompliance with medication regimen: Secondary | ICD-10-CM | POA: Insufficient documentation

## 2015-01-29 DIAGNOSIS — Z880 Allergy status to penicillin: Secondary | ICD-10-CM | POA: Insufficient documentation

## 2015-01-29 DIAGNOSIS — F445 Conversion disorder with seizures or convulsions: Secondary | ICD-10-CM

## 2015-01-29 NOTE — ED Notes (Signed)
Pt to department via [facility].  Per report, pt had an 8 minute seizure prior to arrival.  And then a 1 min seizure on the way to department.  Pt alert and oriented.  States that her seizures are "psychological, and I don't like them."  Pt reports that she doesn't feel like her anxiety medication is enough.

## 2015-01-29 NOTE — ED Provider Notes (Signed)
CSN: [id number]     Arrival date & time 01/29/15  2344 History  By signing my name below, I, Bethel BornBritney McCollum, attest that this documentation has been prepared under the direction and in the presence of Devoria AlbeIva Breda Bond, MD at 0025. Electronically Signed: Bethel BornBritney McCollum, ED Scribe. 01/30/2015. 12:52 AM   Chief Complaint  Patient presents with  . Seizures     The history is provided by the patient and a relative. No language interpreter was used.   Patricia Irwin is a 24 y.o. female with history of seizures, pseudoseizures, and anxiety who presents to the Emergency Department complaining of a seizure tonight. A relative at the bedside states that the pt seized from 10:26 PM to 10:33 PM with generalized shaking. She was in bed watching television when she became "spacey" and her voice changed before she started shaking. Her relative states that she "slammed" her head against the headboard twice but did not fall from bed. After the shaking resolved the pt was reportedly unresponsive for 30-45 minutes where she was just staring but wouldn't talk, she would blink her eyes if he blew on them. She was not incontinent of urine. 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 At present she had sore shoulder pain "like a pulled muscles" but seems to be at her baseline per family. [facility] and has been taking her medication as prescribed.   [facility]  Past Medical History  Diagnosis Date  . Chronic knee pain   . Chronic headache   . Noncompliance with medications   . Pseudoseizures   . Anxiety   . Seizures (HCC)   . Hx of electroencephalogram 12/2013 ([facility])    normal, dx non-epileptic seizures   History reviewed. No pertinent past surgical history. Family History  Problem Relation Age of Onset  . Diabetes  Father    Social History  Substance Use Topics  . Smoking status: Current Every Day Smoker -- 0.50 packs/day    Types: Cigarettes  . Smokeless tobacco: None  . Alcohol Use: No     Comment: occasional   Pt smokes 1/2 PPD and does not use alcohol.  She is not employed and is trying to get on disability.    OB History    Gravida Para Term Preterm AB TAB SAB Ectopic Multiple Living   0              Review of Systems  Musculoskeletal: Positive for myalgias.  Neurological: Positive for seizures.  All other systems reviewed and are negative.   Allergies  Dilantin; Amoxicillin; Amoxicillin-pot clavulanate; Influenza vaccines; Metronidazole; Penicillins; Latex; and Trintellix  Home Medications   Prior to Admission medications   Medication Sig Start Date End Date Taking? Authorizing Provider  clonazePAM (KLONOPIN) 0.5 MG tablet Take 0.5 mg by mouth 3 (three) times daily.     Historical Provider, MD  doxycycline (VIBRAMYCIN) 100 MG capsule Take 1 capsule (100 mg total) by mouth 2 (two) times daily. One po bid x 7 days 01/19/15   Dione Boozeavid Glick, MD  escitalopram (LEXAPRO) 10 MG tablet Take 10 mg by mouth daily.    Historical Provider, MD  levETIRAcetam (KEPPRA XR) 500 MG 24 hr tablet Take 2 tablets (1,000 mg total) by mouth daily. Patient taking differently: Take 2,000 mg by mouth daily.  04/07/13   Tammy Triplett, PA-C  Vortioxetine HBr (TRINTELLIX) 5 MG TABS Take 5 mg by mouth daily.    Historical Provider, MD   BP 106/83 mmHg  Pulse 62  Temp(Src) 98.2 F (36.8 C) (Oral)  Resp 18  Ht  (1.6 m)  Wt 142 lb 3.2 oz (64.501 kg)  BMI 25.20 kg/m2  SpO2 98%  LMP 01/29/2015  Vital signs normal    Physical Exam  Constitutional: She is oriented to person, place, and time. She appears well-developed and well-nourished.  Non-toxic appearance. She does not appear ill. No distress.  HENT:  Head: Normocephalic and atraumatic.  Right Ear: External ear normal.  Left Ear: External ear  normal.  Nose: Nose normal. No mucosal edema or rhinorrhea.  Mouth/Throat: Oropharynx is clear and moist. No dental abscesses or uvula swelling.  Lips and tongue are dry  Eyes: Conjunctivae and EOM are normal. Pupils are equal, round, and reactive to light.  Neck: Normal range of motion and full passive range of motion without pain. Neck supple.  Cardiovascular: Normal rate, regular rhythm and normal heart sounds.  Exam reveals no gallop and no friction rub.   No murmur heard. Pulmonary/Chest: Effort normal and breath sounds normal. No respiratory distress. She has no wheezes. She has no rhonchi. She has no rales. She exhibits no tenderness and no crepitus.  Abdominal: Soft. Normal appearance and bowel sounds are normal. She exhibits no distension. There is no tenderness. There is no rebound and no guarding.  Musculoskeletal: Normal range of motion. She exhibits no edema.       Back:  Moves all extremities well.  Diffuse pain between the shoulder blades to palpation  Neurological: She is alert and oriented to person, place, and time. She has normal strength. No cranial nerve deficit.  Skin: Skin is warm, dry and intact. No rash noted. No erythema. No pallor.  Psychiatric: Her mood appears not anxious. Her speech is delayed. She is slowed.  Flat affect  Nursing note and vitals reviewed.   ED Course  Procedures (including critical care time)  Medications  acetaminophen (TYLENOL) tablet 1,000 mg (1,000 mg Oral Not Given 01/30/15 0251)  acetaminophen (TYLENOL) 500 MG tablet (not administered)  ketorolac (TORADOL) 30 MG/ML injection 30 mg (30 mg Intravenous Given 01/30/15 0050)  0.9 %  sodium chloride infusion (0 mLs Intravenous Stopped 01/30/15 0231)  LORazepam (ATIVAN) 2 MG/ML injection (  Duplicate 01/30/15 0116)  LORazepam (ATIVAN) injection 1 mg (1 mg Intravenous Given 01/30/15 0116)      DIAGNOSTIC STUDIES: Oxygen Saturation is 98% on RA,  normal by my interpretation.     COORDINATION OF CARE: 12:33 AM Discussed treatment plan which includes Toradol and an EKG with pt at bedside and pt agreed to plan.  01:05 called to patient room for seizure activity. Pt has her head arched back and is making swallowing noises, doesn't stop when spoken to, not jerking of her arms or legs. Pt given ativan  Recheck at 2:30 AM patient really wants to talk about her back hurting. It is interesting since she had the Ativan she is more alert and more bright in her affect. At this point she was offered acetaminophen for her back and ice pack. She states "you might as will discharge me I have an ice pack to put on my back at home".   Labs Review Labs Reviewed  PROLACTIN  LEVETIRACETAM LEVEL  pending  Imaging Review No results found. I have personally reviewed and evaluated  these  lab results as part of my medical decision-making.   EKG Interpretation   Date/Time:  Tuesday January 29 2015 23:51:07 EST Ventricular Rate:  58 PR Interval:  127 QRS Duration: 96 QT Interval:  400 QTC Calculation: 393 R Axis:   83 Text Interpretation:  Sinus rhythm Sinus bradycardia Otherwise within  normal limits Since last tracing rate slower (18 Jan 2015) Confirmed by  [facility]  MD-I, Jamauri Kruzel ([id number]) on 01/30/2015 12:04:26 AM      MDM   Final diagnoses:  Pseudoseizure (HCC)  Upper back pain    Plan discharge  [name], MD, FACEP   I personally performed the services described in this documentation, which was scribed in my presence. The recorded information has been reviewed and considered.  [name], MD, Concha Pyo, MD 01/30/15 [phone]

## 2015-01-30 MED ORDER — LORAZEPAM 2 MG/ML IJ SOLN
INTRAMUSCULAR | Status: AC
  Filled 2015-01-30: qty 1

## 2015-01-30 MED ORDER — LORAZEPAM 2 MG/ML IJ SOLN
1.0000 mg | Freq: Once | INTRAMUSCULAR | Status: AC
  Administered 2015-01-30: 1 mg via INTRAVENOUS

## 2015-01-30 MED ORDER — KETOROLAC TROMETHAMINE 30 MG/ML IJ SOLN
30.0000 mg | Freq: Once | INTRAMUSCULAR | Status: AC
  Administered 2015-01-30: 30 mg via INTRAVENOUS
  Filled 2015-01-30: qty 1

## 2015-01-30 MED ORDER — ACETAMINOPHEN 500 MG PO TABS: 1000.0000 mg | ORAL_TABLET | Freq: Once | ORAL | Status: DC

## 2015-01-30 MED ORDER — ACETAMINOPHEN 500 MG PO TABS
ORAL_TABLET | ORAL | Status: AC
  Filled 2015-01-30: qty 2

## 2015-01-30 MED ORDER — SODIUM CHLORIDE 0.9 % IV SOLN
1000.0000 mL | Freq: Once | INTRAVENOUS | Status: AC
  Administered 2015-01-30: 1000 mL via INTRAVENOUS

## 2015-01-30 NOTE — ED Notes (Signed)
Boyfriend called this nurse into room and stated that patient was having another seizure. Patient jerking in bed with eyes open and blinking. No rhythmic eye movements noted. Muscles tense. Activity lasted for approximately 2 minutes. Immediately after, patient began talking and stated "I don't like them. I don't want to have them anymore." Patient also attempting to pull IV out. Dr. Lynelle DoctorKnapp came to bedside during activity and observed. 1 mg of Ativan given as directed by Dr. Lynelle DoctorKnapp. Will continue to monitor patient at this time.

## 2015-01-30 NOTE — Discharge Instructions (Signed)
Go home and rest. Use ice packs which will numb the pain and heat which will relax the back muscles for comfort. You can take ibuprofen 600 mg + acetaminophen 1000 mg 4 times a day for pain as needed.

## 2015-01-31 LAB — PROLACTIN: PROLACTIN: 12.3 ng/mL (ref 4.8–23.3)

## 2015-02-01 LAB — LEVETIRACETAM LEVEL: Levetiracetam Lvl: 36.1 ug/mL (ref 10.0–40.0)

## 2015-02-06 ENCOUNTER — Encounter: Payer: Self-pay | Admitting: Emergency Medicine

## 2015-02-06 ENCOUNTER — Emergency Department
Admission: EM | Admit: 2015-02-06 | Discharge: 2015-02-06 | Disposition: A | Discharge status: Home / Self Care | Payer: Medicaid Other | Attending: Emergency Medicine | Admitting: Emergency Medicine

## 2015-02-06 DIAGNOSIS — F17210 Nicotine dependence, cigarettes, uncomplicated: Secondary | ICD-10-CM | POA: Insufficient documentation

## 2015-02-06 DIAGNOSIS — Z880 Allergy status to penicillin: Secondary | ICD-10-CM | POA: Insufficient documentation

## 2015-02-06 DIAGNOSIS — Z79899 Other long term (current) drug therapy: Secondary | ICD-10-CM | POA: Insufficient documentation

## 2015-02-06 DIAGNOSIS — R569 Unspecified convulsions: Secondary | ICD-10-CM | POA: Insufficient documentation

## 2015-02-06 DIAGNOSIS — M545 Low back pain, unspecified: Secondary | ICD-10-CM

## 2015-02-06 DIAGNOSIS — Z91040 Latex allergy status: Secondary | ICD-10-CM | POA: Insufficient documentation

## 2015-02-06 DIAGNOSIS — F445 Conversion disorder with seizures or convulsions: Secondary | ICD-10-CM

## 2015-02-06 DIAGNOSIS — Z3202 Encounter for pregnancy test, result negative: Secondary | ICD-10-CM | POA: Insufficient documentation

## 2015-02-06 HISTORY — DX: Post-traumatic stress disorder, unspecified: F43.10

## 2015-02-06 HISTORY — DX: Hypoglycemia, unspecified: E16.2

## 2015-02-06 LAB — URINALYSIS COMPLETE WITH MICROSCOPIC (ARMC ONLY)
BILIRUBIN URINE: NEGATIVE
Glucose, UA: NEGATIVE mg/dL
Hgb urine dipstick: NEGATIVE
Ketones, ur: NEGATIVE mg/dL
LEUKOCYTES UA: NEGATIVE
NITRITE: NEGATIVE
Protein, ur: NEGATIVE mg/dL
Specific Gravity, Urine: 1.005 (ref 1.005–1.030)
pH: 7 (ref 5.0–8.0)

## 2015-02-06 LAB — POCT PREGNANCY, URINE: PREG TEST UR: NEGATIVE

## 2015-02-06 MED ORDER — CYCLOBENZAPRINE HCL 10 MG PO TABS
10.0000 mg | ORAL_TABLET | Freq: Once | ORAL | Status: AC
  Administered 2015-02-06: 10 mg via ORAL
  Filled 2015-02-06: qty 1

## 2015-02-06 MED ORDER — ONDANSETRON HCL 4 MG/2ML IJ SOLN
4.0000 mg | Freq: Once | INTRAMUSCULAR | Status: DC
  Filled 2015-02-06: qty 2

## 2015-02-06 MED ORDER — CYCLOBENZAPRINE HCL 10 MG PO TABS: 10.0000 mg | ORAL_TABLET | Freq: Three times a day (TID) | ORAL | Status: DC | PRN

## 2015-02-06 MED ORDER — KETOROLAC TROMETHAMINE 30 MG/ML IJ SOLN
30.0000 mg | Freq: Once | INTRAMUSCULAR | Status: AC
  Administered 2015-02-06: 30 mg via INTRAVENOUS
  Filled 2015-02-06: qty 1

## 2015-02-06 MED ORDER — MORPHINE SULFATE (PF) 4 MG/ML IV SOLN
4.0000 mg | Freq: Once | INTRAVENOUS | Status: DC
  Filled 2015-02-06: qty 1

## 2015-02-06 NOTE — ED Provider Notes (Signed)
[facility] Emergency Department Provider Note  ____________________________________________  Time seen: 6:00 AM  I have reviewed the triage vital signs and the nursing notes.   HISTORY  Chief Complaint Seizures and Back Pain     HPI Patricia Irwin is a 24 y.o. female presents via EMS with seizure-like activity while at home 3 today. Patient also admits to diffuse back pain times months. Patient states that her seizures which occur daily R "psychogenic seizures" for which she's been seen by [facility] Z[facility]. 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 Patient also admits to diffuse back pain that has been going on for months with current pain score 9 out of 10. Patient denies any fever. Patient states that she "knows that these are her psychogenic seizures" based on the symptoms that proceeds them and how she feels afterwards.    Past Medical History  Diagnosis Date  . Chronic knee pain   . Chronic headache   . Noncompliance with medications   . Pseudoseizures   . Anxiety   . Seizures (HCC)   . Hx of electroencephalogram 12/2013 ([facility])    normal, dx non-epileptic seizures  . Hypoglycemia   . PTSD (post-traumatic stress disorder)     There are no active problems to display for this patient.   Past Surgical History  Procedure Laterality Date  . Wisdom tooth extraction      Current Outpatient Rx  Name  Route  Sig  Dispense  Refill  . clonazePAM (KLONOPIN) 0.5 MG tablet   Oral   Take 0.5 mg by mouth 3 (three) times daily.          Marland Kitchen. doxycycline (VIBRAMYCIN) 100 MG capsule   Oral   Take 1 capsule (100 mg total) by mouth 2 (two) times daily. One po bid x 7 days   14 capsule   0   . escitalopram (LEXAPRO) 10 MG tablet   Oral   Take 10 mg by mouth daily.         Marland Kitchen. levETIRAcetam (KEPPRA XR) 500 MG 24 hr tablet   Oral   Take 2 tablets (1,000 mg total)  by mouth daily. Patient taking differently: Take 2,000 mg by mouth daily.    60 tablet   0   . Vortioxetine HBr (TRINTELLIX) 5 MG TABS   Oral   Take 5 mg by mouth daily.           Allergies Dilantin; Amoxicillin; Amoxicillin-pot clavulanate; Influenza vaccines; Metronidazole; Penicillins; Latex; and Trintellix  Family History  Problem Relation Age of Onset  . Diabetes Father     Social History Social History  Substance Use Topics  . Smoking status: Current Every Day Smoker -- 0.50 packs/day    Types: Cigarettes  . Smokeless tobacco: None  . Alcohol Use: No     Comment: occasional    Review of Systems  Constitutional: Negative for fever. Eyes: Negative for visual changes. ENT: Negative for sore throat. Cardiovascular: Negative for chest pain. Respiratory: Negative for shortness of breath. Gastrointestinal: Negative for abdominal pain, vomiting and diarrhea. Genitourinary: Negative for dysuria. Musculoskeletal: Positive for back pain. Skin: Negative for rash. Neurological: Negative for headaches, focal weakness or numbness.   10-point ROS otherwise negative.  ____________________________________________   PHYSICAL EXAM:  VITAL SIGNS: ED Triage Vitals  Enc Vitals Group     BP 02/06/15 0530 113/82 mmHg     Pulse Rate  02/06/15 0530 61     Resp 02/06/15 0530 18     Temp 02/06/15 0530 97.9 F (36.6 C)     Temp Source 02/06/15 0530 Oral     SpO2 02/06/15 0530 98 %     Weight 02/06/15 0530 139 lb (63.05 kg)     Height 02/06/15 0530  (1.6 m)     Head Cir --      Peak Flow --      Pain Score 02/06/15 0600 10     Pain Loc --      Pain Edu? --      Excl. in GC? --      Constitutional: Alert and oriented. Well appearing and in no distress. Eyes: Conjunctivae are normal. PERRL. Normal extraocular movements. ENT   Head: Normocephalic and atraumatic.   Nose: No congestion/rhinnorhea.   Mouth/Throat: Mucous membranes are moist.   Neck: No  stridor. Hematological/Lymphatic/Immunilogical: No cervical lymphadenopathy. Cardiovascular: Normal rate, regular rhythm. Normal and symmetric distal pulses are present in all extremities. No murmurs, rubs, or gallops. Respiratory: Normal respiratory effort without tachypnea nor retractions. Breath sounds are clear and equal bilaterally. No wheezes/rales/rhonchi. Gastrointestinal: Soft and nontender. No distention. There is no CVA tenderness. Genitourinary: deferred Musculoskeletal: Nontender with normal range of motion in all extremities. No joint effusions.  No lower extremity tenderness nor edema. Diffuse paraspinal muscle tenderness to palpation beginning mid upper back to distal lumbar spine Neurologic:  Normal speech and language. No gross focal neurologic deficits are appreciated. Speech is normal.  Skin:  Skin is warm, dry and intact. No rash noted. Psychiatric: Mood and affect are normal. Speech and behavior are normal. Patient exhibits appropriate insight and judgment.     INITIAL IMPRESSION / ASSESSMENT AND PLAN / ED COURSE  Pertinent labs & imaging results that were available during my care of the patient were reviewed by me and considered in my medical decision making (see chart for details).  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 Patient does have a history of epilepsy for which she takes Keppra 2000 mg daily. I considered giving the patient 1 g of Keppra IV however the patient refused stating that she knows that these are her psychogenic seizures and that she can tell them differently from her epileptic seizures. Patient states that she mainly wanted help for her back pain for which she received Flexeril 10 mg  ____________________________________________   FINAL CLINICAL IMPRESSION(S) / ED DIAGNOSES  Final diagnoses:  Pseudoseizures  Bilateral low back pain without sciatica      Darci Current, MD 02/06/15 [phone]

## 2015-02-06 NOTE — ED Notes (Addendum)
Pt presents to ED after she had seizure like activity while at home X2 and mid back pain. Denies injury. Pt has hx of pseudoseizures. Pt states after her last seizure she felt like she was unable to move and reports that has never happened before. Pt very talkative with no increased work of breathing or acute distress noted at this time.

## 2015-02-06 NOTE — Discharge Instructions (Signed)
Nonepileptic Seizures °Nonepileptic seizures are seizures that are not caused by abnormal electrical signals in your brain. These seizures often seem like epileptic seizures, but they are not caused by epilepsy.  °There are two types of nonepileptic seizures: °· A physiologic nonepileptic seizure results from a disruption in your brain. °· A psychogenic seizure results from emotional stress. These seizures are sometimes called pseudoseizures. °CAUSES  °Causes of physiologic nonepileptic seizures include:  °· Sudden drop in blood pressure. °· Low blood sugar. °· Low levels of salt (sodium) in your blood. °· Low levels of calcium in your blood. °· Migraine. °· Heart rhythm problems. °· Sleep disorders. °· Drug and alcohol abuse. °Common causes of psychogenic nonepileptic seizures include: °· Stress. °· Emotional trauma. °· Sexual or physical abuse. °· Major life events, such as divorce or the death of a loved one. °· Mental health disorders, including panic attack and hyperactivity disorder. °SIGNS AND SYMPTOMS °A nonepileptic seizure can look like an epileptic seizure, including uncontrollable shaking (convulsions), or changes in attention, behavior, or the ability to remain awake and alert. However, there are some differences. Nonepileptic seizures usually: °· Do not cause physical injuries. °· Start slowly. °· Include crying or shrieking. °· Last longer than 2 minutes. °· Have a short recovery time without headache or exhaustion. °DIAGNOSIS  °Your health care provider can usually diagnose nonepileptic seizures after taking your medical history and giving you a physical exam. Your health care provider may want to talk to your friends or relatives who have seen you have a seizure.  °You may also need to have tests to look for causes of physiologic nonepileptic seizures. This may include an electroencephalogram (EEG), which is a test that measures electrical activity in your brain. If you have had an epileptic  seizure, the results of your EEG will be abnormal. If your health care provider thinks you have had a psychogenic nonepileptic seizure, you may need to see a mental health specialist for an evaluation. °TREATMENT  °Treatment depends on the type and cause of your seizures. °· For physiologic nonepileptic seizures, treatment is aimed at addressing the underlying condition that caused the seizures. These seizures usually stop when the underlying condition is properly treated. °· Nonepileptic seizures do not respond to the seizure medicines used to treat epilepsy. °· For psychogenic seizures, you may need to work with a mental health specialist. °HOME CARE INSTRUCTIONS °Home care will depend on the type of nonepileptic seizures you have.  °· Follow all your health care provider's instructions. °· Keep all your follow-up appointments. °SEEK MEDICAL CARE IF: °You continue to have seizures after treatment. °SEEK IMMEDIATE MEDICAL CARE IF: °· Your seizures change or become more frequent. °· You injure yourself during a seizure. °· You have one seizure after another. °· You have trouble recovering from a seizure. °· You have chest pain or trouble breathing. °MAKE SURE YOU: °· Understand these instructions. °· Will watch your condition. °· Will get help right away if you are not doing well or get worse. °  °This information is not intended to replace advice given to you by your health care provider. Make sure you discuss any questions you have with your health care provider. °  °Document Released: 03/27/2005 Document Revised: 03/02/2014 Document Reviewed: 12/06/2012 °Elsevier Interactive Patient Education ©2016 [employer] Inc[employer] ° °

## 2015-02-06 NOTE — ED Notes (Signed)
Pt was said to have seizure like activity while at home. Hx of pseudoseizures. Pt states after her seizure she felt like she was unable to move and had mid back pain which was abnormal for her. Pt states her back is tender with palpation. Pt is said to be able to speak during her seizure like activity and denies incontinence. Pt denies falling or any other injury.

## 2015-02-06 NOTE — ED Notes (Signed)
Pt resting on stretcher with visitors at the bedside. No distress noted at this time. Pt states she would like to go home since she is feeling much better. Pt states she would probably be ok if she was given a prescription for flexeril. MD aware.

## 2015-02-26 ENCOUNTER — Encounter: Payer: Self-pay | Admitting: *Deleted

## 2015-02-26 ENCOUNTER — Emergency Department
Admission: EM | Admit: 2015-02-26 | Discharge: 2015-02-26 | Disposition: A | Discharge status: Home / Self Care | Payer: Self-pay | Attending: Emergency Medicine | Admitting: Emergency Medicine

## 2015-02-26 DIAGNOSIS — Z91040 Latex allergy status: Secondary | ICD-10-CM | POA: Insufficient documentation

## 2015-02-26 DIAGNOSIS — F17210 Nicotine dependence, cigarettes, uncomplicated: Secondary | ICD-10-CM | POA: Insufficient documentation

## 2015-02-26 DIAGNOSIS — Z880 Allergy status to penicillin: Secondary | ICD-10-CM | POA: Insufficient documentation

## 2015-02-26 DIAGNOSIS — M549 Dorsalgia, unspecified: Secondary | ICD-10-CM | POA: Insufficient documentation

## 2015-02-26 DIAGNOSIS — R6884 Jaw pain: Secondary | ICD-10-CM | POA: Insufficient documentation

## 2015-02-26 DIAGNOSIS — F445 Conversion disorder with seizures or convulsions: Secondary | ICD-10-CM | POA: Insufficient documentation

## 2015-02-26 DIAGNOSIS — Z79899 Other long term (current) drug therapy: Secondary | ICD-10-CM | POA: Insufficient documentation

## 2015-02-26 DIAGNOSIS — Z3202 Encounter for pregnancy test, result negative: Secondary | ICD-10-CM | POA: Insufficient documentation

## 2015-02-26 DIAGNOSIS — R110 Nausea: Secondary | ICD-10-CM | POA: Insufficient documentation

## 2015-02-26 LAB — POC URINE PREG, ED: Preg Test, Ur: NEGATIVE

## 2015-02-26 MED ORDER — CYCLOBENZAPRINE HCL 5 MG PO TABS: 5.0000 mg | ORAL_TABLET | Freq: Three times a day (TID) | ORAL | Status: DC | PRN

## 2015-02-26 MED ORDER — CYCLOBENZAPRINE HCL 10 MG PO TABS
5.0000 mg | ORAL_TABLET | Freq: Once | ORAL | Status: AC
  Administered 2015-02-26: 5 mg via ORAL
  Filled 2015-02-26: qty 1

## 2015-02-26 NOTE — Discharge Instructions (Signed)
Please seek medical attention for any high fevers, chest pain, shortness of breath, change in behavior, persistent vomiting, bloody stool or any other new or concerning symptoms. ° °Nonepileptic Seizures °Nonepileptic seizures are seizures that are not caused by abnormal electrical signals in your brain. These seizures often seem like epileptic seizures, but they are not caused by epilepsy.  °There are two types of nonepileptic seizures: °· A physiologic nonepileptic seizure results from a disruption in your brain. °· A psychogenic seizure results from emotional stress. These seizures are sometimes called pseudoseizures. °CAUSES  °Causes of physiologic nonepileptic seizures include:  °· Sudden drop in blood pressure. °· Low blood sugar. °· Low levels of salt (sodium) in your blood. °· Low levels of calcium in your blood. °· Migraine. °· Heart rhythm problems. °· Sleep disorders. °· Drug and alcohol abuse. °Common causes of psychogenic nonepileptic seizures include: °· Stress. °· Emotional trauma. °· Sexual or physical abuse. °· Major life events, such as divorce or the death of a loved one. °· Mental health disorders, including panic attack and hyperactivity disorder. °SIGNS AND SYMPTOMS °A nonepileptic seizure can look like an epileptic seizure, including uncontrollable shaking (convulsions), or changes in attention, behavior, or the ability to remain awake and alert. However, there are some differences. Nonepileptic seizures usually: °· Do not cause physical injuries. °· Start slowly. °· Include crying or shrieking. °· Last longer than 2 minutes. °· Have a short recovery time without headache or exhaustion. °DIAGNOSIS  °Your health care provider can usually diagnose nonepileptic seizures after taking your medical history and giving you a physical exam. Your health care provider may want to talk to your friends or relatives who have seen you have a seizure.  °You may also need to have tests to look for causes of  physiologic nonepileptic seizures. This may include an electroencephalogram (EEG), which is a test that measures electrical activity in your brain. If you have had an epileptic seizure, the results of your EEG will be abnormal. If your health care provider thinks you have had a psychogenic nonepileptic seizure, you may need to see a mental health specialist for an evaluation. °TREATMENT  °Treatment depends on the type and cause of your seizures. °· For physiologic nonepileptic seizures, treatment is aimed at addressing the underlying condition that caused the seizures. These seizures usually stop when the underlying condition is properly treated. °· Nonepileptic seizures do not respond to the seizure medicines used to treat epilepsy. °· For psychogenic seizures, you may need to work with a mental health specialist. °HOME CARE INSTRUCTIONS °Home care will depend on the type of nonepileptic seizures you have.  °· Follow all your health care provider's instructions. °· Keep all your follow-up appointments. °SEEK MEDICAL CARE IF: °You continue to have seizures after treatment. °SEEK IMMEDIATE MEDICAL CARE IF: °· Your seizures change or become more frequent. °· You injure yourself during a seizure. °· You have one seizure after another. °· You have trouble recovering from a seizure. °· You have chest pain or trouble breathing. °MAKE SURE YOU: °· Understand these instructions. °· Will watch your condition. °· Will get help right away if you are not doing well or get worse. °  °This information is not intended to replace advice given to you by your health care provider. Make sure you discuss any questions you have with your health care provider. °  °Document Released: 03/27/2005 Document Revised: 03/02/2014 Document Reviewed: 12/06/2012 °Elsevier Interactive Patient Education ©2016 Else[employer] Inc[employer] ° °

## 2015-02-26 NOTE — ED Notes (Addendum)
Pt arrived to ED via EMS after a reported 8 seizures while at home over 45 minutes. EMS reports the fire dept. Witness 2 more seizures lasting 30 seconds each. Pt was reported to be post-ictal upon their arrival. Pt is alert and oriented x4 once in ED. PT reports hx of seizures daily. Pt verbalized that she is compliant with Keppra. Pt also reports being worried about pregnancy due to a positive urine test. EMS reports CBG was 84.

## 2015-02-26 NOTE — ED Notes (Signed)
Pt reports having jaw pain at this time. Pt reports usually having jaw pain after this type of activity.

## 2015-02-26 NOTE — ED Provider Notes (Signed)
[facility] Emergency Department Provider Note   ____________________________________________  Time seen: 1715  I have reviewed the triage vital signs and the nursing notes.   HISTORY  Chief Complaint Seizures   History limited by: Not Limited, some history obtained from partner   HPI Patricia Irwin is a 25 y.o. female with history of pseudoseizure who presents to the emergency department today with concern for multiple seizures. Partner states these were her pseudoseizures. He states she had 8 or 9. He says that this happened in quick succession. The patient herself states that she was feeling okay this morning she had had some nausea earlier in the morning. She denies missing any of her Keppra doses. She denies any recent sleep deprivation alcohol or drug use. She is complaining of some jaw pain and back pain which she states she gets after her pseudoseizures. She did have some concern for being pregnant however states she got a blood test at her doctor's office 1 week ago which was negative for pregnancy.     Past Medical History  Diagnosis Date  . Chronic knee pain   . Chronic headache   . Noncompliance with medications   . Pseudoseizures   . Anxiety   . Seizures (HCC)   . Hx of electroencephalogram 12/2013 ([facility])    normal, dx non-epileptic seizures  . Hypoglycemia   . PTSD (post-traumatic stress disorder)     There are no active problems to display for this patient.   Past Surgical History  Procedure Laterality Date  . Wisdom tooth extraction      Current Outpatient Rx  Name  Route  Sig  Dispense  Refill  . clonazePAM (KLONOPIN) 0.5 MG tablet   Oral   Take 0.5 mg by mouth 3 (three) times daily.          . cyclobenzaprine (FLEXERIL) 10 MG tablet   Oral   Take 1 tablet (10 mg total) by mouth 3 (three) times daily as needed for muscle spasms.   30 tablet   0   . doxycycline (VIBRAMYCIN) 100 MG capsule   Oral   Take 1 capsule (100  mg total) by mouth 2 (two) times daily. One po bid x 7 days   14 capsule   0   . escitalopram (LEXAPRO) 10 MG tablet   Oral   Take 10 mg by mouth daily.         Marland Kitchen. levETIRAcetam (KEPPRA XR) 500 MG 24 hr tablet   Oral   Take 2 tablets (1,000 mg total) by mouth daily. Patient taking differently: Take 2,000 mg by mouth daily.    60 tablet   0   . Vortioxetine HBr (TRINTELLIX) 5 MG TABS   Oral   Take 5 mg by mouth daily.           Allergies Dilantin; Amoxicillin; Amoxicillin-pot clavulanate; Influenza vaccines; Metronidazole; Penicillins; Latex; and Trintellix  Family History  Problem Relation Age of Onset  . Diabetes Father     Social History Social History  Substance Use Topics  . Smoking status: Current Every Day Smoker -- 0.50 packs/day    Types: Cigarettes  . Smokeless tobacco: None  . Alcohol Use: No     Comment: occasional    Review of Systems  Constitutional: Negative for fever. Cardiovascular: Negative for chest pain. Respiratory: Negative for shortness of breath. Gastrointestinal: Negative for abdominal pain, vomiting and diarrhea. Neurological: Negative for headaches, focal weakness or numbness. Positive for seizure  10-point  ROS otherwise negative.  ____________________________________________   PHYSICAL EXAM:  VITAL SIGNS: ED Triage Vitals  Enc Vitals Group     BP 02/26/15 1548 119/91 mmHg     Pulse Rate 02/26/15 1548 75     Resp 02/26/15 1535 10     Temp 02/26/15 1548 98.3 F (36.8 C)     Temp Source 02/26/15 1548 Oral     SpO2 02/26/15 1535 97 %     Weight 02/26/15 1548 139 lb 12.4 oz (63.4 kg)     Height 02/26/15 1548 5\' 3"  (1.6 m)     Head Cir --      Peak Flow --      Pain Score 02/26/15 1545 8   Constitutional: Alert and oriented. Well appearing and in no distress. Eyes: Conjunctivae are normal. PERRL. Normal extraocular movements. ENT   Head: Normocephalic and atraumatic.   Nose: No congestion/rhinnorhea.    Mouth/Throat: Mucous membranes are moist.   Neck: No stridor. Hematological/Lymphatic/Immunilogical: No cervical lymphadenopathy. Cardiovascular: Normal rate, regular rhythm.  No murmurs, rubs, or gallops. Respiratory: Normal respiratory effort without tachypnea nor retractions. Breath sounds are clear and equal bilaterally. No wheezes/rales/rhonchi. Gastrointestinal: Soft and nontender. No distention.  Genitourinary: Deferred Musculoskeletal: Normal range of motion in all extremities. No joint effusions.  No lower extremity tenderness nor edema. Neurologic:  Normal speech and language. No gross focal neurologic deficits are appreciated.  Skin:  Skin is warm, dry and intact. No rash noted. Psychiatric: Mood and affect are normal. Speech and behavior are normal. Patient exhibits appropriate insight and judgment.  ____________________________________________    LABS (pertinent positives/negatives)  Labs Reviewed  POC URINE PREG, ED     ____________________________________________   EKG  None  ____________________________________________    RADIOLOGY  None   ____________________________________________   PROCEDURES  Procedure(s) performed: None  Critical Care performed: No  ____________________________________________   INITIAL IMPRESSION / ASSESSMENT AND PLAN / ED COURSE  Pertinent labs & imaging results that were available during my care of the patient were reviewed by me and considered in my medical decision making (see chart for details).  Patient with history of pseudoseizures presents for the same. The time of my exam no focal neuro deficits. Patient was complaining some John back pain which is consistent with her usual pain after her pseudoseizures. Will give Flexeril which patient states has helped in the past.  ____________________________________________   FINAL CLINICAL IMPRESSION(S) / ED DIAGNOSES  Final diagnoses:  None     Phineas Semen, MD 02/26/15 1730

## 2015-03-10 ENCOUNTER — Emergency Department
Admission: EM | Admit: 2015-03-10 | Discharge: 2015-03-10 | Disposition: A | Discharge status: Home / Self Care | Payer: Self-pay | Attending: Emergency Medicine | Admitting: Emergency Medicine

## 2015-03-10 DIAGNOSIS — F17210 Nicotine dependence, cigarettes, uncomplicated: Secondary | ICD-10-CM | POA: Insufficient documentation

## 2015-03-10 DIAGNOSIS — Z91040 Latex allergy status: Secondary | ICD-10-CM | POA: Insufficient documentation

## 2015-03-10 DIAGNOSIS — Z880 Allergy status to penicillin: Secondary | ICD-10-CM | POA: Insufficient documentation

## 2015-03-10 DIAGNOSIS — R569 Unspecified convulsions: Secondary | ICD-10-CM | POA: Insufficient documentation

## 2015-03-10 DIAGNOSIS — F445 Conversion disorder with seizures or convulsions: Secondary | ICD-10-CM

## 2015-03-10 DIAGNOSIS — F329 Major depressive disorder, single episode, unspecified: Secondary | ICD-10-CM | POA: Insufficient documentation

## 2015-03-10 MED ORDER — LORAZEPAM 1 MG PO TABS
1.0000 mg | ORAL_TABLET | Freq: Once | ORAL | Status: AC
  Administered 2015-03-10: 1 mg via ORAL
  Filled 2015-03-10: qty 1

## 2015-03-10 NOTE — ED Notes (Signed)
Pt to room 12 via stretcher; brought from vehicle with reports of seizure; pt with eyes closed; answers questions when spoke to by Dr Mayford KnifeWilliams; st unsure of what medication she is taking but starts with "K"; pt st she has epilepsy and "other seizures too"; SO st pt's seizures are "typically psychogenic" and has been under stress today

## 2015-03-10 NOTE — ED Provider Notes (Signed)
[facility] Emergency Department Provider Note     Time seen: ----------------------------------------- 8:30 PM on 03/10/2015 -----------------------------------------    I have reviewed the triage vital signs and the nursing notes.   HISTORY  Chief Complaint Seizures    HPI Patricia Irwin is a 25 y.o. female who presents to ER after possible seizure. Patient was brought from her vehicle with eyes closed although answering questions. Patient states she takes medication that starts with a K but also has other seizures as well. 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 Patient denies any complaints at this time.   Past Medical History  Diagnosis Date  . Chronic knee pain   . Chronic headache   . Noncompliance with medications   . Pseudoseizures   . Anxiety   . Seizures (HCC)   . Hx of electroencephalogram 12/2013 ([facility])    normal, dx non-epileptic seizures  . Hypoglycemia   . PTSD (post-traumatic stress disorder)     There are no active problems to display for this patient.   Past Surgical History  Procedure Laterality Date  . Wisdom tooth extraction      Allergies Dilantin; Amoxicillin; Amoxicillin-pot clavulanate; Influenza vaccines; Metronidazole; Penicillins; Latex; and Trintellix  Social History Social History  Substance Use Topics  . Smoking status: Current Every Day Smoker -- 0.50 packs/day    Types: Cigarettes  . Smokeless tobacco: Not on file  . Alcohol Use: No     Comment: occasional    Review of Systems Constitutional: Negative for fever. Eyes: Negative for visual changes. ENT: Negative for sore throat. Cardiovascular: Negative for chest pain. Respiratory: Negative for shortness of breath. Gastrointestinal: Negative for abdominal pain, vomiting and diarrhea. Genitourinary: Negative for dysuria. Musculoskeletal: Negative  for back pain. Skin: Negative for rash. Neurological: Negative for headaches, focal weakness or numbness.  10-point ROS otherwise negative.  ____________________________________________   PHYSICAL EXAM:  VITAL SIGNS: ED Triage Vitals  Enc Vitals Group     BP 03/10/15 2005 121/81 mmHg     Pulse Rate 03/10/15 2005 103     Resp 03/10/15 2005 20     Temp 03/10/15 2005 97.9 F (36.6 C)     Temp Source 03/10/15 2005 Oral     SpO2 03/10/15 2005 98 %     Weight --      Height --      Head Cir --      Peak Flow --      Pain Score --      Pain Loc --      Pain Edu? --      Excl. in GC? --     Constitutional: Alert and oriented. Well appearing and in no distress. Eyes: Conjunctivae are normal. PERRL. Normal extraocular movements. ENT   Head: Normocephalic and atraumatic.   Nose: No congestion/rhinnorhea.   Mouth/Throat: Mucous membranes are moist.   Neck: No stridor. Cardiovascular: Normal rate, regular rhythm. Normal and symmetric distal pulses are present in all extremities. No murmurs, rubs, or gallops. Respiratory: Normal respiratory effort without tachypnea nor retractions. Breath sounds are clear and equal bilaterally. No wheezes/rales/rhonchi. Gastrointestinal: Soft and nontender. No distention. No abdominal bruits.  Musculoskeletal: Nontender with normal range of motion in all extremities. No joint effusions.  No lower extremity tenderness nor edema. Neurologic:  Normal speech and language. No gross focal neurologic deficits are appreciated. Speech is normal. Skin:  Skin is warm,  dry and intact. No rash noted. Psychiatric: Depressed mood and affect. ____________________________________________  EKG: Interpreted by me. Normal sinus rhythm with a rate of 99 bpm, normal PR interval, normal QRS, normal QT interval. Normal axis, no evidence of acute infarction  ____________________________________________  ED COURSE:  Pertinent labs & imaging results that were  available during my care of the patient were reviewed by me and considered in my medical decision making (see chart for details). Patient clinically with clear pseudoseizure. This will not require further workup at this time.  ____________________________________________  FINAL ASSESSMENT AND PLAN  Pseudoseizure  Plan: Patient with labs and imaging as dictated above. I did discuss stress level of the patient, she was given a dose of Ativan. Patient is requesting to go home, is currently medically stable to do so. I do not think this was an epileptic seizure.   Emily FilbertWilliams, Jonathan E, MD   Emily FilbertJonathan E Williams, MD 03/10/15 [id number]

## 2015-03-10 NOTE — ED Notes (Signed)
Pt watching cardiac monitor in no acute distress, pt speaking to boyfriend and alert and oriented while watching cardiac monitor. Additional warm blankets provided.

## 2015-03-10 NOTE — ED Notes (Signed)
Pt's boyfriend states pt has "psychogenic seizures". After boyfriend spoke of pt's diagnosis, pt immediately sat up in bed, stuck arms straight out, layed back down in bed and began to open and close eyes rapidly, shake arms and legs and arch back. Pt with pox on ra during episode of seizure like activity of 100%, no loss of bowel or bladder or oral trauma noted, pt kept mouth open with tongue sticking out during episode, episode lasted approx 20 seconds, dr. Mayford KnifeWilliams notified. Pt immediately alert after episode.

## 2015-03-10 NOTE — Discharge Instructions (Signed)
Nonepileptic Seizures °Nonepileptic seizures are seizures that are not caused by abnormal electrical signals in your brain. These seizures often seem like epileptic seizures, but they are not caused by epilepsy.  °There are two types of nonepileptic seizures: °· A physiologic nonepileptic seizure results from a disruption in your brain. °· A psychogenic seizure results from emotional stress. These seizures are sometimes called pseudoseizures. °CAUSES  °Causes of physiologic nonepileptic seizures include:  °· Sudden drop in blood pressure. °· Low blood sugar. °· Low levels of salt (sodium) in your blood. °· Low levels of calcium in your blood. °· Migraine. °· Heart rhythm problems. °· Sleep disorders. °· Drug and alcohol abuse. °Common causes of psychogenic nonepileptic seizures include: °· Stress. °· Emotional trauma. °· Sexual or physical abuse. °· Major life events, such as divorce or the death of a loved one. °· Mental health disorders, including panic attack and hyperactivity disorder. °SIGNS AND SYMPTOMS °A nonepileptic seizure can look like an epileptic seizure, including uncontrollable shaking (convulsions), or changes in attention, behavior, or the ability to remain awake and alert. However, there are some differences. Nonepileptic seizures usually: °· Do not cause physical injuries. °· Start slowly. °· Include crying or shrieking. °· Last longer than 2 minutes. °· Have a short recovery time without headache or exhaustion. °DIAGNOSIS  °Your health care provider can usually diagnose nonepileptic seizures after taking your medical history and giving you a physical exam. Your health care provider may want to talk to your friends or relatives who have seen you have a seizure.  °You may also need to have tests to look for causes of physiologic nonepileptic seizures. This may include an electroencephalogram (EEG), which is a test that measures electrical activity in your brain. If you have had an epileptic  seizure, the results of your EEG will be abnormal. If your health care provider thinks you have had a psychogenic nonepileptic seizure, you may need to see a mental health specialist for an evaluation. °TREATMENT  °Treatment depends on the type and cause of your seizures. °· For physiologic nonepileptic seizures, treatment is aimed at addressing the underlying condition that caused the seizures. These seizures usually stop when the underlying condition is properly treated. °· Nonepileptic seizures do not respond to the seizure medicines used to treat epilepsy. °· For psychogenic seizures, you may need to work with a mental health specialist. °HOME CARE INSTRUCTIONS °Home care will depend on the type of nonepileptic seizures you have.  °· Follow all your health care provider's instructions. °· Keep all your follow-up appointments. °SEEK MEDICAL CARE IF: °You continue to have seizures after treatment. °SEEK IMMEDIATE MEDICAL CARE IF: °· Your seizures change or become more frequent. °· You injure yourself during a seizure. °· You have one seizure after another. °· You have trouble recovering from a seizure. °· You have chest pain or trouble breathing. °MAKE SURE YOU: °· Understand these instructions. °· Will watch your condition. °· Will get help right away if you are not doing well or get worse. °  °This information is not intended to replace advice given to you by your health care provider. Make sure you discuss any questions you have with your health care provider. °  °Document Released: 03/27/2005 Document Revised: 03/02/2014 Document Reviewed: 12/06/2012 °Elsevier Interactive Patient Education ©2016 [employer] Inc[employer] ° °

## 2015-03-14 ENCOUNTER — Emergency Department
Admission: EM | Admit: 2015-03-14 | Discharge: 2015-03-14 | Disposition: A | Discharge status: Home / Self Care | Payer: Self-pay | Attending: Student | Admitting: Student

## 2015-03-14 ENCOUNTER — Encounter: Payer: Self-pay | Admitting: Emergency Medicine

## 2015-03-14 DIAGNOSIS — F445 Conversion disorder with seizures or convulsions: Secondary | ICD-10-CM | POA: Insufficient documentation

## 2015-03-14 DIAGNOSIS — F17210 Nicotine dependence, cigarettes, uncomplicated: Secondary | ICD-10-CM | POA: Insufficient documentation

## 2015-03-14 DIAGNOSIS — Z79899 Other long term (current) drug therapy: Secondary | ICD-10-CM | POA: Insufficient documentation

## 2015-03-14 DIAGNOSIS — R21 Rash and other nonspecific skin eruption: Secondary | ICD-10-CM | POA: Insufficient documentation

## 2015-03-14 DIAGNOSIS — Z880 Allergy status to penicillin: Secondary | ICD-10-CM | POA: Insufficient documentation

## 2015-03-14 DIAGNOSIS — Z792 Long term (current) use of antibiotics: Secondary | ICD-10-CM | POA: Insufficient documentation

## 2015-03-14 DIAGNOSIS — Z91040 Latex allergy status: Secondary | ICD-10-CM | POA: Insufficient documentation

## 2015-03-14 LAB — CBC WITH DIFFERENTIAL/PLATELET
BASOS PCT: 1 %
Basophils Absolute: 0.1 10*3/uL (ref 0–0.1)
Eosinophils Absolute: 0.1 10*3/uL (ref 0–0.7)
Eosinophils Relative: 1 %
HEMATOCRIT: 45.1 % (ref 35.0–47.0)
HEMOGLOBIN: 14.9 g/dL (ref 12.0–16.0)
LYMPHS ABS: 2.7 10*3/uL (ref 1.0–3.6)
Lymphocytes Relative: 37 %
MCH: 28.5 pg (ref 26.0–34.0)
MCHC: 33 g/dL (ref 32.0–36.0)
MCV: 86.3 fL (ref 80.0–100.0)
MONOS PCT: 7 %
Monocytes Absolute: 0.5 10*3/uL (ref 0.2–0.9)
NEUTROS ABS: 4 10*3/uL (ref 1.4–6.5)
NEUTROS PCT: 54 %
Platelets: 318 10*3/uL (ref 150–440)
RBC: 5.22 MIL/uL — AB (ref 3.80–5.20)
RDW: 14.2 % (ref 11.5–14.5)
WBC: 7.3 10*3/uL (ref 3.6–11.0)

## 2015-03-14 LAB — BASIC METABOLIC PANEL
Anion gap: 7 (ref 5–15)
BUN: 7 mg/dL (ref 6–20)
CHLORIDE: 104 mmol/L (ref 101–111)
CO2: 28 mmol/L (ref 22–32)
CREATININE: 0.7 mg/dL (ref 0.44–1.00)
Calcium: 9.3 mg/dL (ref 8.9–10.3)
GFR calc non Af Amer: >60 mL/min (ref >60)
Glucose, Bld: 84 mg/dL (ref 65–99)
POTASSIUM: 3.8 mmol/L (ref 3.5–5.1)
SODIUM: 139 mmol/L (ref 135–145)

## 2015-03-14 NOTE — ED Notes (Signed)
Brought in via ems from home   On arrival she had a sz in the lobby but per family she is here for poss allergic reaction to left [facility] for iv started last weekend

## 2015-03-14 NOTE — ED Provider Notes (Signed)
[facility] Emergency Department Provider Note  ____________________________________________  Time seen: Approximately 6:27 PM  I have reviewed the triage vital signs and the nursing notes.   HISTORY  Chief Complaint seizure rash     HPI Alaiyah Wilford Grist is a 25 y.o. female with history of anxiety, psychogenic seizures, seizures and PTSDpresents for evaluation of rash to the left antecubital fossa which began gradually 4 days ago, constant since onset, currently mild to moderate. The patient reports that she was seen here and 03/10/2015 and had an IV placed in the left antecubital fossa that was taped down. She believes she is having an allergic reaction to the tape that was used to secure the IV. On chart review, she was indeed seen here on 03/10/15 for likely psychogenic seizures. On arrival to the emergency department, she was witnessed to have some activity that was concerning for seizure. She reports to me "I'm pretty sure I just had a psychogenic seizure... They happen when I get stressed out like when I come to the ER but I'm really here because of my rash". She reports she has these psychogenic seizures almost daily. She has been compliant with all medications. She denies any chest pain, difficulty breathing. No recent illness agree no vomiting, diarrhea, fevers or chills.   Past Medical History  Diagnosis Date  . Chronic knee pain   . Chronic headache   . Noncompliance with medications   . Pseudoseizures   . Anxiety   . Seizures (HCC)   . Hx of electroencephalogram 12/2013 ([facility])    normal, dx non-epileptic seizures  . Hypoglycemia   . PTSD (post-traumatic stress disorder)     There are no active problems to display for this patient.   Past Surgical History  Procedure Laterality Date  . Wisdom tooth extraction      Current Outpatient Rx  Name  Route  Sig  Dispense  Refill  . clonazePAM (KLONOPIN) 0.5 MG tablet   Oral   Take 0.5 mg by mouth 3  (three) times daily.          . cyclobenzaprine (FLEXERIL) 5 MG tablet   Oral   Take 1 tablet (5 mg total) by mouth every 8 (eight) hours as needed for muscle spasms.   20 tablet   0   . doxycycline (VIBRAMYCIN) 100 MG capsule   Oral   Take 1 capsule (100 mg total) by mouth 2 (two) times daily. One po bid x 7 days   14 capsule   0   . escitalopram (LEXAPRO) 10 MG tablet   Oral   Take 10 mg by mouth daily.         Marland Kitchen levETIRAcetam (KEPPRA XR) 500 MG 24 hr tablet   Oral   Take 2 tablets (1,000 mg total) by mouth daily. Patient taking differently: Take 2,000 mg by mouth daily.    60 tablet   0   . Vortioxetine HBr (TRINTELLIX) 5 MG TABS   Oral   Take 5 mg by mouth daily.           Allergies Dilantin; Amoxicillin; Amoxicillin-pot clavulanate; Influenza vaccines; Metronidazole; Penicillins; Latex; and Trintellix  Family History  Problem Relation Age of Onset  . Diabetes Father     Social History Social History  Substance Use Topics  . Smoking status: Current Every Day Smoker -- 0.50 packs/day    Types: Cigarettes  . Smokeless tobacco: None  . Alcohol Use: No     Comment: occasional  Review of Systems Constitutional: No fever/chills Eyes: No visual changes. ENT: No sore throat. Cardiovascular: Denies chest pain. Respiratory: Denies shortness of breath. Gastrointestinal: No abdominal pain.  No nausea, no vomiting.  No diarrhea.  No constipation. Genitourinary: Negative for dysuria. Musculoskeletal: Negative for back pain. Skin: + rash left antecubital fossa Neurological: Negative for headaches, focal weakness or numbness.  10-point ROS otherwise negative.  ____________________________________________   PHYSICAL EXAM:  VITAL SIGNS: ED Triage Vitals  Enc Vitals Group     BP 03/14/15 1742 118/87 mmHg     Pulse Rate 03/14/15 1742 92     Resp 03/14/15 1742 20     Temp 03/14/15 1742 98 F (36.7 C)     Temp Source 03/14/15 1742 Oral     SpO2  03/14/15 1742 98 %     Weight 03/14/15 1742 139 lb (63.05 kg)     Height --      Head Cir --      Peak Flow --      Pain Score 03/14/15 1742 8     Pain Loc --      Pain Edu? --      Excl. in GC? --     Constitutional: Alert and oriented. Well appearing and in no acute distress. Eyes: Conjunctivae are normal. PERRL. EOMI. Head: Atraumatic. Nose: No congestion/rhinnorhea. Mouth/Throat: Mucous membranes are moist.  Oropharynx non-erythematous. Neck: No stridor. Cardiovascular: Normal rate, regular rhythm. Grossly normal heart sounds.  Good peripheral circulation. Respiratory: Normal respiratory effort.  No retractions. Lungs CTAB. Gastrointestinal: Soft and nontender. No distention. No CVA tenderness. Genitourinary: deferred Musculoskeletal: No lower extremity tenderness nor edema.  No joint effusions. Neurologic:  Normal speech and language. No gross focal neurologic deficits are appreciated. No gait instability. Skin:  Skin is warm, dry and intact. There is a very small area of erythema in the left antecubital fossa without induration or warmth or fluctuance. The mild erythema is in the distribution where her IV was previously taped down and there is no skin rash elsewhere. Psychiatric: Mood and affect are normal. Speech and behavior are normal.  ____________________________________________   LABS (all labs ordered are listed, but only abnormal results are displayed)  Labs Reviewed  CBC WITH DIFFERENTIAL/PLATELET - Abnormal; Notable for the following:    RBC 5.22 (*)    All other components within normal limits  BASIC METABOLIC PANEL   ____________________________________________  EKG  none ____________________________________________  RADIOLOGY  none ____________________________________________   PROCEDURES  Procedure(s) performed: None  Critical Care performed: No  ____________________________________________   INITIAL IMPRESSION / ASSESSMENT AND PLAN / ED  COURSE  Pertinent labs & imaging results that were available during my care of the patient were reviewed by me and considered in my medical decision making (see chart for details).  Cerenity Goshorn Long is a 25 y.o. female with history of anxiety, psychogenic seizures, seizures and PTSDpresents for evaluation of rash to the left antecubital fossa which began gradually 4 days ago, constant since onset, currently mild to moderate. On arrival to hallway bed 5, she was sitting somewhat limp in the wheelchair with her eyes wide open but she blinked and began conversing normally with me immediately when I called her name and  transferred herself from the wheelchair to the bed. She has an intact neurological exam. Vital signs stable, she is afebrile. She does have evidence of some local skin irritation likely from the tape that was used to secure her IV several days ago. We discussed avoidance  of that particular allergen, Benadryl for comfort, use of emollients and she is comfortable with the discharge plan. Her labs are reviewed and are unremarkable. She believes she had a psychogenic seizure. She did not wet her pants. No tongue biting. She is not postictal. She reports these happen to her frequently. DC home with return precautions and close PCP follow-up. ____________________________________________   FINAL CLINICAL IMPRESSION(S) / ED DIAGNOSES  Final diagnoses:  Rash  Psychogenic nonepileptic seizure      Gayla Doss, MD 03/14/15 [phone]

## 2015-03-24 ENCOUNTER — Emergency Department: Payer: Self-pay

## 2015-03-24 ENCOUNTER — Encounter: Payer: Self-pay | Admitting: Emergency Medicine

## 2015-03-24 ENCOUNTER — Emergency Department
Admission: EM | Admit: 2015-03-24 | Discharge: 2015-03-24 | Disposition: A | Discharge status: Home / Self Care | Payer: Self-pay | Attending: Emergency Medicine | Admitting: Emergency Medicine

## 2015-03-24 DIAGNOSIS — Z79899 Other long term (current) drug therapy: Secondary | ICD-10-CM | POA: Insufficient documentation

## 2015-03-24 DIAGNOSIS — F1310 Sedative, hypnotic or anxiolytic abuse, uncomplicated: Secondary | ICD-10-CM | POA: Insufficient documentation

## 2015-03-24 DIAGNOSIS — Z3202 Encounter for pregnancy test, result negative: Secondary | ICD-10-CM | POA: Insufficient documentation

## 2015-03-24 DIAGNOSIS — R197 Diarrhea, unspecified: Secondary | ICD-10-CM | POA: Insufficient documentation

## 2015-03-24 DIAGNOSIS — Z91040 Latex allergy status: Secondary | ICD-10-CM | POA: Insufficient documentation

## 2015-03-24 DIAGNOSIS — S62627A Displaced fracture of medial phalanx of left little finger, initial encounter for closed fracture: Secondary | ICD-10-CM | POA: Insufficient documentation

## 2015-03-24 DIAGNOSIS — Y92002 Bathroom of unspecified non-institutional (private) residence single-family (private) house as the place of occurrence of the external cause: Secondary | ICD-10-CM | POA: Insufficient documentation

## 2015-03-24 DIAGNOSIS — F17210 Nicotine dependence, cigarettes, uncomplicated: Secondary | ICD-10-CM | POA: Insufficient documentation

## 2015-03-24 DIAGNOSIS — Z880 Allergy status to penicillin: Secondary | ICD-10-CM | POA: Insufficient documentation

## 2015-03-24 DIAGNOSIS — S62609A Fracture of unspecified phalanx of unspecified finger, initial encounter for closed fracture: Secondary | ICD-10-CM

## 2015-03-24 DIAGNOSIS — Y998 Other external cause status: Secondary | ICD-10-CM | POA: Insufficient documentation

## 2015-03-24 DIAGNOSIS — R569 Unspecified convulsions: Secondary | ICD-10-CM | POA: Insufficient documentation

## 2015-03-24 DIAGNOSIS — R112 Nausea with vomiting, unspecified: Secondary | ICD-10-CM | POA: Insufficient documentation

## 2015-03-24 DIAGNOSIS — R42 Dizziness and giddiness: Secondary | ICD-10-CM | POA: Insufficient documentation

## 2015-03-24 DIAGNOSIS — R52 Pain, unspecified: Secondary | ICD-10-CM

## 2015-03-24 DIAGNOSIS — F439 Reaction to severe stress, unspecified: Secondary | ICD-10-CM | POA: Insufficient documentation

## 2015-03-24 DIAGNOSIS — Y9389 Activity, other specified: Secondary | ICD-10-CM | POA: Insufficient documentation

## 2015-03-24 DIAGNOSIS — W1839XA Other fall on same level, initial encounter: Secondary | ICD-10-CM | POA: Insufficient documentation

## 2015-03-24 LAB — CBC WITH DIFFERENTIAL/PLATELET
Basophils Absolute: 0.1 10*3/uL (ref 0–0.1)
Basophils Relative: 1 %
EOS ABS: 0 10*3/uL (ref 0–0.7)
EOS PCT: 0 %
HCT: 44.5 % (ref 35.0–47.0)
Hemoglobin: 14.7 g/dL (ref 12.0–16.0)
LYMPHS ABS: 3.5 10*3/uL (ref 1.0–3.6)
LYMPHS PCT: 38 %
MCH: 29.2 pg (ref 26.0–34.0)
MCHC: 33 g/dL (ref 32.0–36.0)
MCV: 88.5 fL (ref 80.0–100.0)
MONO ABS: 0.6 10*3/uL (ref 0.2–0.9)
MONOS PCT: 6 %
Neutro Abs: 5 10*3/uL (ref 1.4–6.5)
Neutrophils Relative %: 55 %
PLATELETS: 294 10*3/uL (ref 150–440)
RBC: 5.03 MIL/uL (ref 3.80–5.20)
RDW: 14.1 % (ref 11.5–14.5)
WBC: 9.2 10*3/uL (ref 3.6–11.0)

## 2015-03-24 LAB — URINE DRUG SCREEN, QUALITATIVE (ARMC ONLY)
Amphetamines, Ur Screen: NOT DETECTED
BARBITURATES, UR SCREEN: NOT DETECTED
BENZODIAZEPINE, UR SCRN: POSITIVE — AB
CANNABINOID 50 NG, UR ~~LOC~~: NOT DETECTED
Cocaine Metabolite,Ur ~~LOC~~: NOT DETECTED
MDMA (Ecstasy)Ur Screen: NOT DETECTED
METHADONE SCREEN, URINE: NOT DETECTED
Opiate, Ur Screen: NOT DETECTED
Phencyclidine (PCP) Ur S: NOT DETECTED
TRICYCLIC, UR SCREEN: POSITIVE — AB

## 2015-03-24 LAB — URINALYSIS COMPLETE WITH MICROSCOPIC (ARMC ONLY)
BILIRUBIN URINE: NEGATIVE
Bacteria, UA: NONE SEEN
GLUCOSE, UA: NEGATIVE mg/dL
Ketones, ur: NEGATIVE mg/dL
LEUKOCYTES UA: NEGATIVE
NITRITE: NEGATIVE
Protein, ur: NEGATIVE mg/dL
SPECIFIC GRAVITY, URINE: 1.009 (ref 1.005–1.030)
pH: 6 (ref 5.0–8.0)

## 2015-03-24 LAB — BASIC METABOLIC PANEL
Anion gap: 9 (ref 5–15)
BUN: 9 mg/dL (ref 6–20)
CALCIUM: 9.3 mg/dL (ref 8.9–10.3)
CO2: 23 mmol/L (ref 22–32)
CREATININE: 0.8 mg/dL (ref 0.44–1.00)
Chloride: 106 mmol/L (ref 101–111)
GFR calc Af Amer: >60 mL/min (ref >60)
GLUCOSE: 92 mg/dL (ref 65–99)
POTASSIUM: 3.7 mmol/L (ref 3.5–5.1)
SODIUM: 138 mmol/L (ref 135–145)

## 2015-03-24 LAB — ETHANOL

## 2015-03-24 LAB — PREGNANCY, URINE: PREG TEST UR: NEGATIVE

## 2015-03-24 LAB — GLUCOSE, CAPILLARY: Glucose-Capillary: 71 mg/dL (ref 65–99)

## 2015-03-24 MED ORDER — OXYCODONE-ACETAMINOPHEN 5-325 MG PO TABS
1.0000 | ORAL_TABLET | Freq: Once | ORAL | Status: AC
Start: 2015-03-24 — End: 2015-03-24
  Administered 2015-03-24: 1 via ORAL
  Filled 2015-03-24: qty 1

## 2015-03-24 MED ORDER — OXYCODONE-ACETAMINOPHEN 5-325 MG PO TABS: 1.0000 | ORAL_TABLET | ORAL | Status: DC | PRN

## 2015-03-24 MED ORDER — OXYCODONE-ACETAMINOPHEN 5-325 MG PO TABS
1.0000 | ORAL_TABLET | Freq: Once | ORAL | Status: AC
  Administered 2015-03-24: 1 via ORAL
  Filled 2015-03-24: qty 1

## 2015-03-24 MED ORDER — LORAZEPAM 2 MG/ML IJ SOLN
1.0000 mg | Freq: Once | INTRAMUSCULAR | Status: AC
  Administered 2015-03-24: 1 mg via INTRAVENOUS
  Filled 2015-03-24: qty 1

## 2015-03-24 MED ORDER — SODIUM CHLORIDE 0.9 % IV BOLUS (SEPSIS)
1000.0000 mL | Freq: Once | INTRAVENOUS | Status: AC
  Administered 2015-03-24: 1000 mL via INTRAVENOUS

## 2015-03-24 MED ORDER — SODIUM CHLORIDE 0.9 % IV SOLN
1000.0000 mg | Freq: Once | INTRAVENOUS | Status: AC
  Administered 2015-03-24: 1000 mg via INTRAVENOUS
  Filled 2015-03-24: qty 10

## 2015-03-24 NOTE — ED Notes (Signed)
Patient reports she had a seizure on Saturday and hit her head.  Reports woke tonight "feeling drunk"  Patient awake and alert.  No acute distress noted.

## 2015-03-24 NOTE — ED Notes (Signed)
Pt reports seizure yesterday and hit her head.  Pt reports n/v/d x 3 days, 6 episodes diarrhea in past 24hr and 2 episodes vomiting in past 24 hr.  Pt reports taking  keppra and  klonopin tid.  Pt denies pain.  Pt NAD at this time, respirations equal and unlabored, skin warm and dry.

## 2015-03-24 NOTE — ED Notes (Signed)
Pt reports after seizure, dizziness gone

## 2015-03-24 NOTE — ED Notes (Addendum)
Pt request to use bathroom, this nurse left room to allow for privacy.  When this nurse return couple minutes later, pt standing next to toilet, pt begins to fall to floor, this nurse caught head, head did not directly hit floor.  Pt tonic w/ arched back.  Abnormal activity lasted approx. 30 sec, during which this nurse supported head.  No incontinence.  Dr. Dolores Frame at bedside, IV started, pt moved to bed with assistance of 4 staff.  Pt alert and oriented currently.  Pt reports hx of psychogenic seizures.

## 2015-03-24 NOTE — ED Notes (Signed)
While asking patient triage questions, boyfriend comes into room.  Patient begins to stare off and not answering questions.  Boyfriend states "she's getting ready to have a seizure".  Patient then begins saying "no, no, no", when patient ask what do you mean by no patient states "I'm not going to have a seizure I just want the room to stop spinning".

## 2015-03-24 NOTE — ED Provider Notes (Signed)
[facility] Emergency Department Provider Note  ____________________________________________  Time seen: Approximately 1:51 AM  I have reviewed the triage vital signs and the nursing notes.   HISTORY  Chief Complaint Dizziness    HPI Patricia Irwin is a 25 y.o. female who presents to the ED for seizure. 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 States she has daily seizures. Boyfriend states she had a seizure yesterday and struck her head. Patient reportedly woke up tonight "feeling drunk on morphine and alcohol".This past week patient reports N/V/D 3 days. States she was able to keep her Keppra down. Denies associated fever, chills, chest pain, shortness of breath. Denies recent travel or trauma. Nothing makes her symptoms better or worse.   Past Medical History  Diagnosis Date  . Chronic knee pain   . Chronic headache   . Noncompliance with medications   . Pseudoseizures   . Anxiety   . Seizures (HCC)   . Hx of electroencephalogram 12/2013 ([facility])    normal, dx non-epileptic seizures  . Hypoglycemia   . PTSD (post-traumatic stress disorder)     There are no active problems to display for this patient.   Past Surgical History  Procedure Laterality Date  . Wisdom tooth extraction      Current Outpatient Rx  Name  Route  Sig  Dispense  Refill  . clonazePAM (KLONOPIN) 0.5 MG tablet   Oral   Take 1 mg by mouth 3 (three) times daily.          Marland Kitchen escitalopram (LEXAPRO) 10 MG tablet   Oral   Take 10 mg by mouth daily.         Marland Kitchen levETIRAcetam (KEPPRA XR) 500 MG 24 hr tablet   Oral   Take 2 tablets (1,000 mg total) by mouth daily. Patient taking differently: Take 2,000 mg by mouth daily.    60 tablet   0   . cyclobenzaprine (FLEXERIL) 5 MG tablet   Oral   Take 1 tablet (5 mg total) by mouth every 8 (eight) hours as needed for muscle spasms.   20 tablet   0   . doxycycline (VIBRAMYCIN) 100 MG capsule    Oral   Take 1 capsule (100 mg total) by mouth 2 (two) times daily. One po bid x 7 days   14 capsule   0   . Vortioxetine HBr (TRINTELLIX) 5 MG TABS   Oral   Take 5 mg by mouth daily.           Allergies Dilantin; Amoxicillin; Amoxicillin-pot clavulanate; Influenza vaccines; Metronidazole; Penicillins; Latex; and Trintellix  Family History  Problem Relation Age of Onset  . Diabetes Father     Social History Social History  Substance Use Topics  . Smoking status: Current Every Day Smoker -- 0.50 packs/day    Types: Cigarettes  . Smokeless tobacco: None  . Alcohol Use: No     Comment: occasional    Review of Systems Constitutional: No fever/chills Eyes: No visual changes. ENT: No sore throat. Cardiovascular: Denies chest pain. Respiratory: Denies shortness of breath. Gastrointestinal: No abdominal pain.  Positive for nausea, vomiting and diarrhea.  No constipation. Genitourinary: Negative for dysuria. Musculoskeletal: Negative for back pain. Skin: Negative for rash. Neurological: Negative for headaches, focal weakness or numbness. Psychiatric:Increased stress secondary to recent death of friend.  10-point ROS otherwise negative.  ____________________________________________   PHYSICAL EXAM:  VITAL SIGNS: ED Triage Vitals  Enc Vitals Group  BP 03/24/15 0120 114/74 mmHg     Pulse Rate 03/24/15 0120 79     Resp 03/24/15 0120 20     Temp 03/24/15 0120 97.6 F (36.4 C)     Temp Source 03/24/15 0120 Oral     SpO2 03/24/15 0120 99 %     Weight 03/24/15 0120 137 lb (62.143 kg)     Height 03/24/15 0120  (1.575 m)     Head Cir --      Peak Flow --      Pain Score 03/24/15 0129 0     Pain Loc --      Pain Edu? --      Excl. in GC? --     Constitutional: Alert and oriented. Well appearing and in mild acute distress. Eyes: Conjunctivae are normal. PERRL. EOMI. Head: Atraumatic. Nose: No congestion/rhinnorhea. Mouth/Throat: Mucous membranes are  moist.  Oropharynx non-erythematous. Neck: No stridor.  No cervical spine tenderness to palpation. Cardiovascular: Normal rate, regular rhythm. Grossly normal heart sounds.  Good peripheral circulation. Respiratory: Normal respiratory effort.  No retractions. Lungs CTAB. Gastrointestinal: Soft and nontender. No distention. No abdominal bruits. No CVA tenderness. Musculoskeletal: Left fifth digit reddened and tender to palpation. Full range of motion with pain. 2+ radial pulses. Brisk, less than 5 second capillary refill. No lower extremity tenderness nor edema.  No joint effusions. Neurologic:  Normal speech and language. No gross focal neurologic deficits are appreciated.  Skin:  Skin is warm, dry and intact. No rash noted. Psychiatric: Mood and affect are normal. Speech and behavior are normal.  ____________________________________________   LABS (all labs ordered are listed, but only abnormal results are displayed)  Labs Reviewed  URINE DRUG SCREEN, QUALITATIVE ([facility] ONLY) - Abnormal; Notable for the following:    Tricyclic, Ur Screen POSITIVE (*)    Benzodiazepine, Ur Scrn POSITIVE (*)    All other components within normal limits  URINALYSIS COMPLETEWITH MICROSCOPIC ([facility] ONLY) - Abnormal; Notable for the following:    Color, Urine YELLOW (*)    APPearance CLEAR (*)    Hgb urine dipstick 1+ (*)    Squamous Epithelial / LPF 0-5 (*)    All other components within normal limits  GLUCOSE, CAPILLARY  CBC WITH DIFFERENTIAL/PLATELET  BASIC METABOLIC PANEL  ETHANOL  PREGNANCY, URINE  CBG MONITORING, ED   ____________________________________________  EKG  ED ECG REPORT I, SUNG,JADE J, the attending physician, personally viewed and interpreted this ECG.   Date: 03/24/2015  EKG Time: 0144  Rate: 77  Rhythm: normal EKG, normal sinus rhythm  Axis: Normal  Intervals:none  ST&T Change: Nonspecific  ____________________________________________  RADIOLOGY  CT head without  contrast interpreted per Dr. Cherly Hensen: No evidence of traumatic intracranial injury or fracture.  X-ray left little finger (viewed by me, interpreted per Dr. Cherly Hensen): Suggestion of minimally displaced fracture involving the volar ulnar aspect of the base of the fifth middle phalanx, extending to the proximal interphalangeal joint space. ___________________________________________   PROCEDURES  Procedure(s) performed: None  Critical Care performed: No  ____________________________________________   INITIAL IMPRESSION / ASSESSMENT AND PLAN / ED COURSE  Pertinent labs & imaging results that were available during my care of the patient were reviewed by me and considered in my medical decision making (see chart for details).  25 year old female with daily psychogenic seizures with recent GI illness who presents with seizure yesterday with striking head. Boyfriend reports patient seems confused tonight. I was called to patient's bedside for patient having tonic-clonic seizure. She  did not strike her head; nurse caught her and gently lowered her to the ground. She was not post ictal and was immediately alert and oriented. She did strike her left fifth digit and complains of finger pain. Will check screening lab work, administer IV benzodiazepine, IV Keppra and obtain CT head to evaluate for intracranial hemorrhage.  ----------------------------------------- 2:56 AM on 03/24/2015 -----------------------------------------  Updated patient of laboratory and imaging results. She seems very concerned whether or not she will be given a Percocet prescription for her finger fracture. She remains alert and oriented without focal neurological deficit. IV Keppra infusing.  ----------------------------------------- 3:29 AM on 03/24/2015 -----------------------------------------  Patient is feeling much better and is eager for discharge. Plan for limited prescription for opiate analgesics and orthopedics  follow-up. Return precautions given. Patient and spouse verbalize understanding and agree with plan of care. ____________________________________________   FINAL CLINICAL IMPRESSION(S) / ED DIAGNOSES  Final diagnoses:  Pain  Seizure (HCC)  Finger fracture, left, closed, initial encounter      Irean Hong, MD 03/24/15 [phone]

## 2015-03-24 NOTE — Discharge Instructions (Signed)
1. Continue taking daily medications as directed by your doctor. 2. Do not drive or operate heavy machinery as you have daily seizures. 3. You may take pain medicines as needed (Percocet #10). 4. Keep splint clean and dry. 5. Return to the ER for 6 symptoms, persistent vomiting, difficulty breathing or other concerns.  Seizure, Adult A seizure is abnormal electrical activity in the brain. Seizures usually last from 30 seconds to 2 minutes. There are various types of seizures. Before a seizure, you may have a warning sensation (aura) that a seizure is about to occur. An aura may include the following symptoms:   Fear or anxiety.  Nausea.  Feeling like the room is spinning (vertigo).  Vision changes, such as seeing flashing lights or spots. Common symptoms during a seizure include:  A change in attention or behavior (altered mental status).  Convulsions with rhythmic jerking movements.  Drooling.  Rapid eye movements.  Grunting.  Loss of bladder and bowel control.  Bitter taste in the mouth.  Tongue biting. After a seizure, you may feel confused and sleepy. You may also have an injury resulting from convulsions during the seizure. HOME CARE INSTRUCTIONS   If you are given medicines, take them exactly as prescribed by your health care provider.  Keep all follow-up appointments as directed by your health care provider.  Do not swim or drive or engage in risky activity during which a seizure could cause further injury to you or others until your health care provider says it is OK.  Get adequate rest.  Teach friends and family what to do if you have a seizure. They should:  Lay you on the ground to prevent a fall.  Put a cushion under your head.  Loosen any tight clothing around your neck.  Turn you on your side. If vomiting occurs, this helps keep your airway clear.  Stay with you until you recover.  Know whether or not you need emergency care. SEEK IMMEDIATE  MEDICAL CARE IF:  The seizure lasts longer than 5 minutes.  The seizure is severe or you do not wake up immediately after the seizure.  You have an altered mental status after the seizure.  You are having more frequent or worsening seizures. Someone should drive you to the emergency department or call local emergency services (911 in [place] MAKE SURE YOU:  Understand these instructions.  Will watch your condition.  Will get help right away if you are not doing well or get worse.   This information is not intended to replace advice given to you by your health care provider. Make sure you discuss any questions you have with your health care provider.   Document Released: 02/07/2000 Document Revised: 03/02/2014 Document Reviewed: 09/21/2012 Elsevier Interactive Patient Education 2016 [employer]  Finger Fracture Finger fractures are breaks in the bones of the fingers. There are many types of fractures. There are also different ways of treating these fractures. Your doctor will talk with you about the best way to treat your fracture. Injury is the main cause of broken fingers. This includes:  Injuries while playing sports.  Workplace injuries.  Falls. HOME CARE  Follow your doctor's instructions for:  Activities.  Exercises.  Physical therapy.  Take medicines only as told by your doctor for pain, discomfort, or fever. GET HELP IF: You have pain or swelling that limits:  The motion of your fingers.  The use of your fingers. GET HELP RIGHT AWAY IF:  You cannot feel your fingers,  or your fingers become numb.   This information is not intended to replace advice given to you by your health care provider. Make sure you discuss any questions you have with your health care provider.   Document Released: 07/29/2007 Document Revised: 03/02/2014 Document Reviewed: 09/21/2012 Elsevier Interactive Patient Education [employer]

## 2015-04-06 ENCOUNTER — Emergency Department: Payer: Medicaid Other

## 2015-04-06 ENCOUNTER — Emergency Department
Admission: EM | Admit: 2015-04-06 | Discharge: 2015-04-06 | Disposition: A | Discharge status: Home / Self Care | Payer: Medicaid Other | Attending: Emergency Medicine | Admitting: Emergency Medicine

## 2015-04-06 ENCOUNTER — Encounter: Payer: Self-pay | Admitting: Emergency Medicine

## 2015-04-06 DIAGNOSIS — Y9389 Activity, other specified: Secondary | ICD-10-CM | POA: Diagnosis not present

## 2015-04-06 DIAGNOSIS — W2209XA Striking against other stationary object, initial encounter: Secondary | ICD-10-CM | POA: Diagnosis not present

## 2015-04-06 DIAGNOSIS — Z91040 Latex allergy status: Secondary | ICD-10-CM | POA: Diagnosis not present

## 2015-04-06 DIAGNOSIS — Z792 Long term (current) use of antibiotics: Secondary | ICD-10-CM | POA: Insufficient documentation

## 2015-04-06 DIAGNOSIS — Y9289 Other specified places as the place of occurrence of the external cause: Secondary | ICD-10-CM | POA: Insufficient documentation

## 2015-04-06 DIAGNOSIS — R52 Pain, unspecified: Secondary | ICD-10-CM

## 2015-04-06 DIAGNOSIS — Z880 Allergy status to penicillin: Secondary | ICD-10-CM | POA: Insufficient documentation

## 2015-04-06 DIAGNOSIS — Y998 Other external cause status: Secondary | ICD-10-CM | POA: Insufficient documentation

## 2015-04-06 DIAGNOSIS — Z79899 Other long term (current) drug therapy: Secondary | ICD-10-CM | POA: Insufficient documentation

## 2015-04-06 DIAGNOSIS — S6992XA Unspecified injury of left wrist, hand and finger(s), initial encounter: Secondary | ICD-10-CM | POA: Diagnosis present

## 2015-04-06 DIAGNOSIS — S60052A Contusion of left little finger without damage to nail, initial encounter: Secondary | ICD-10-CM | POA: Insufficient documentation

## 2015-04-06 DIAGNOSIS — F17210 Nicotine dependence, cigarettes, uncomplicated: Secondary | ICD-10-CM | POA: Insufficient documentation

## 2015-04-06 DIAGNOSIS — S6000XA Contusion of unspecified finger without damage to nail, initial encounter: Secondary | ICD-10-CM

## 2015-04-06 NOTE — ED Provider Notes (Signed)
[facility] Emergency Department Provider Note  ____________________________________________  Time seen: Approximately 825PM  I have reviewed the triage vital signs and the nursing notes.   HISTORY  Chief Complaint Finger Injury    HPI Patricia Irwin is a 25 y.o. female with a history of anxiety and "nonepileptic seizures" who is presenting today with left small finger pain. She says that she had a seizure today where she took her splint off and smashed her left small finger against a wall. She says that she is having increased pain ever since. Also ran out of her Vicodin last night.Says is compliant with her Keppra does not want to be evaluated for seizures right now which she has every day. Per the charge nurse she had a seizure in the waiting room but is no residual symptoms. Denies any headache. Denies any confusion.   Past Medical History  Diagnosis Date  . Chronic knee pain   . Chronic headache   . Noncompliance with medications   . Pseudoseizures   . Anxiety   . Seizures (HCC)   . Hx of electroencephalogram 12/2013 ([facility])    normal, dx non-epileptic seizures  . Hypoglycemia   . PTSD (post-traumatic stress disorder)     There are no active problems to display for this patient.   Past Surgical History  Procedure Laterality Date  . Wisdom tooth extraction      Current Outpatient Rx  Name  Route  Sig  Dispense  Refill  . clonazePAM (KLONOPIN) 0.5 MG tablet   Oral   Take 1 mg by mouth 3 (three) times daily.          . cyclobenzaprine (FLEXERIL) 5 MG tablet   Oral   Take 1 tablet (5 mg total) by mouth every 8 (eight) hours as needed for muscle spasms.   20 tablet   0   . doxycycline (VIBRAMYCIN) 100 MG capsule   Oral   Take 1 capsule (100 mg total) by mouth 2 (two) times daily. One po bid x 7 days   14 capsule   0   . escitalopram (LEXAPRO) 10 MG tablet   Oral   Take 10 mg by mouth daily.         Marland Kitchen levETIRAcetam (KEPPRA XR)  500 MG 24 hr tablet   Oral   Take 2 tablets (1,000 mg total) by mouth daily. Patient taking differently: Take 2,000 mg by mouth daily.    60 tablet   0   . oxyCODONE-acetaminophen (ROXICET) 5-325 MG tablet   Oral   Take 1 tablet by mouth every 4 (four) hours as needed for severe pain.   10 tablet   0   . Vortioxetine HBr (TRINTELLIX) 5 MG TABS   Oral   Take 5 mg by mouth daily.           Allergies Dilantin; Amoxicillin; Amoxicillin-pot clavulanate; Influenza vaccines; Metronidazole; Penicillins; Latex; and Trintellix  Family History  Problem Relation Age of Onset  . Diabetes Father     Social History Social History  Substance Use Topics  . Smoking status: Current Every Day Smoker -- 0.50 packs/day    Types: Cigarettes  . Smokeless tobacco: None  . Alcohol Use: No     Comment: occasional    Review of Systems Constitutional: No fever/chills Eyes: No visual changes. ENT: No sore throat. Cardiovascular: Denies chest pain. Respiratory: Denies shortness of breath. Gastrointestinal: No abdominal pain.  No nausea, no vomiting.  No diarrhea.  No  constipation. Genitourinary: Negative for dysuria. Musculoskeletal: Negative for back pain. Skin: Negative for rash. Neurological: Negative for headaches, focal weakness or numbness.  10-point ROS otherwise negative.  ____________________________________________   PHYSICAL EXAM:  VITAL SIGNS: ED Triage Vitals  Enc Vitals Group     BP 04/06/15 1803 118/75 mmHg     Pulse Rate 04/06/15 1803 71     Resp 04/06/15 1803 20     Temp 04/06/15 1803 98.1 F (36.7 C)     Temp Source 04/06/15 1803 Oral     SpO2 04/06/15 1803 100 %     Weight 04/06/15 1803 130 lb (58.968 kg)     Height 04/06/15 1803  (1.676 m)     Head Cir --      Peak Flow --      Pain Score 04/06/15 1804 10     Pain Loc --      Pain Edu? --      Excl. in GC? --     Constitutional: Alert and oriented. Well appearing and in no acute distress. Eyes:  Conjunctivae are normal. PERRL. EOMI. Head: Atraumatic. Nose: No congestion/rhinnorhea. Mouth/Throat: Mucous membranes are moist.  Oropharynx non-erythematous. Neck: No stridor.   Cardiovascular: Normal rate, regular rhythm. Grossly normal heart sounds.  Good peripheral circulation. Respiratory: Normal respiratory effort.  No retractions. Lungs CTAB. Gastrointestinal: Soft and nontender. No distention. No abdominal bruits. No CVA tenderness. Musculoskeletal: Left small finger with splint. Splint removed. That was a Velcro splint. Mild tenderness to the base of the middle phalanx. No deformity or swelling. Neurovascularly intact. Splint replaced after exam. Neurologic:  Normal speech and language. No gross focal neurologic deficits are appreciated. No gait instability. Skin:  Skin is warm, dry and intact. No rash noted. Psychiatric: Mood and affect are normal. Speech and behavior are normal.  ____________________________________________   LABS (all labs ordered are listed, but only abnormal results are displayed)  Labs Reviewed - No data to display ____________________________________________  EKG   ____________________________________________  RADIOLOGY  Stable at this place fracture involving the volar base of the fifth middle phalanx. ____________________________________________   PROCEDURES  ____________________________________________   INITIAL IMPRESSION / ASSESSMENT AND PLAN / ED COURSE  Pertinent labs & imaging results that were available during my care of the patient were reviewed by me and considered in my medical decision making (see chart for details).  ----------------------------------------- 9:32 PM on 04/06/2015 -----------------------------------------  Patient resting comfortably. I updated her and her boyfriend regarding her x-ray results. Contusion but no further worsening of the previous injury. Advised her to keep the arm elevated and use ice. Will  be discharged to home. Has follow-up with orthopedics on March 10 which was the plan with orthopedics after being splinted. ____________________________________________   FINAL CLINICAL IMPRESSION(S) / ED DIAGNOSES  Left small finger contusion.    Myrna Blazer, MD 04/06/15 2133

## 2015-04-06 NOTE — Discharge Instructions (Signed)

## 2015-04-06 NOTE — ED Notes (Signed)
Pt resting.

## 2015-04-06 NOTE — ED Notes (Signed)
Pt states that she does not want to be evaluated at this time for seizures, patient states she has stress seizures that happen daily at this time. Pt states even though she had seizure in the lobby at this time, she does not want to be evaluated.

## 2015-04-06 NOTE — ED Notes (Signed)
Reports left pinky fx a few weeks ago, today had a seizure and hurt it again.  States she has seizures everyday and doesnt want to be treated for that.

## 2015-04-06 NOTE — ED Notes (Signed)
Warm blankets provided. Call bell provided and at side.

## 2015-05-08 ENCOUNTER — Encounter: Payer: Self-pay | Admitting: *Deleted

## 2015-05-08 ENCOUNTER — Emergency Department
Admission: EM | Admit: 2015-05-08 | Discharge: 2015-05-08 | Disposition: A | Discharge status: Home / Self Care | Payer: Medicaid Other | Attending: Emergency Medicine | Admitting: Emergency Medicine

## 2015-05-08 ENCOUNTER — Emergency Department: Payer: Medicaid Other

## 2015-05-08 DIAGNOSIS — R569 Unspecified convulsions: Secondary | ICD-10-CM

## 2015-05-08 DIAGNOSIS — Z79899 Other long term (current) drug therapy: Secondary | ICD-10-CM | POA: Insufficient documentation

## 2015-05-08 DIAGNOSIS — R0602 Shortness of breath: Secondary | ICD-10-CM | POA: Insufficient documentation

## 2015-05-08 DIAGNOSIS — Z880 Allergy status to penicillin: Secondary | ICD-10-CM | POA: Insufficient documentation

## 2015-05-08 DIAGNOSIS — Z3202 Encounter for pregnancy test, result negative: Secondary | ICD-10-CM | POA: Insufficient documentation

## 2015-05-08 DIAGNOSIS — F17210 Nicotine dependence, cigarettes, uncomplicated: Secondary | ICD-10-CM | POA: Insufficient documentation

## 2015-05-08 DIAGNOSIS — G40909 Epilepsy, unspecified, not intractable, without status epilepticus: Secondary | ICD-10-CM | POA: Insufficient documentation

## 2015-05-08 DIAGNOSIS — Z91040 Latex allergy status: Secondary | ICD-10-CM | POA: Insufficient documentation

## 2015-05-08 LAB — CBC
HEMATOCRIT: 41.2 % (ref 35.0–47.0)
Hemoglobin: 13.9 g/dL (ref 12.0–16.0)
MCH: 29.5 pg (ref 26.0–34.0)
MCHC: 33.9 g/dL (ref 32.0–36.0)
MCV: 87.1 fL (ref 80.0–100.0)
PLATELETS: 316 10*3/uL (ref 150–440)
RBC: 4.73 MIL/uL (ref 3.80–5.20)
RDW: 14.2 % (ref 11.5–14.5)
WBC: 10.2 10*3/uL (ref 3.6–11.0)

## 2015-05-08 LAB — URINALYSIS COMPLETE WITH MICROSCOPIC (ARMC ONLY)
BILIRUBIN URINE: NEGATIVE
Bacteria, UA: NONE SEEN
GLUCOSE, UA: NEGATIVE mg/dL
KETONES UR: NEGATIVE mg/dL
Leukocytes, UA: NEGATIVE
Nitrite: NEGATIVE
PROTEIN: NEGATIVE mg/dL
SPECIFIC GRAVITY, URINE: 1.002 — AB (ref 1.005–1.030)
pH: 7 (ref 5.0–8.0)

## 2015-05-08 LAB — BASIC METABOLIC PANEL
Anion gap: 5 (ref 5–15)
BUN: 6 mg/dL (ref 6–20)
CALCIUM: 8.6 mg/dL — AB (ref 8.9–10.3)
CO2: 25 mmol/L (ref 22–32)
CREATININE: 0.61 mg/dL (ref 0.44–1.00)
Chloride: 105 mmol/L (ref 101–111)
GFR calc Af Amer: >60 mL/min (ref >60)
GLUCOSE: 87 mg/dL (ref 65–99)
POTASSIUM: 3.3 mmol/L — AB (ref 3.5–5.1)
SODIUM: 135 mmol/L (ref 135–145)

## 2015-05-08 LAB — URINE DRUG SCREEN, QUALITATIVE (ARMC ONLY)
AMPHETAMINES, UR SCREEN: NOT DETECTED
BARBITURATES, UR SCREEN: NOT DETECTED
Benzodiazepine, Ur Scrn: NOT DETECTED
Cannabinoid 50 Ng, Ur ~~LOC~~: NOT DETECTED
Cocaine Metabolite,Ur ~~LOC~~: NOT DETECTED
MDMA (Ecstasy)Ur Screen: NOT DETECTED
METHADONE SCREEN, URINE: NOT DETECTED
OPIATE, UR SCREEN: NOT DETECTED
Phencyclidine (PCP) Ur S: NOT DETECTED
Tricyclic, Ur Screen: NOT DETECTED

## 2015-05-08 LAB — POCT PREGNANCY, URINE: PREG TEST UR: NEGATIVE

## 2015-05-08 MED ORDER — SODIUM CHLORIDE 0.9 % IV BOLUS (SEPSIS)
1000.0000 mL | Freq: Once | INTRAVENOUS | Status: AC
  Administered 2015-05-08: 1000 mL via INTRAVENOUS

## 2015-05-08 NOTE — ED Notes (Signed)

## 2015-05-08 NOTE — ED Provider Notes (Signed)
[facility] Emergency Department Provider Note  ____________________________________________  Time seen: Approximately 104 AM  I have reviewed the triage vital signs and the nursing notes.   HISTORY  Chief Complaint Seizures    HPI Patricia Irwin is a 25 y.o. female who comes into the hospital today with a seizure. According to the patient's boyfriend she had an absence seizure and then 10 minutes later had another seizure. He reports that she was generally shaking all over and it seemed to last about 1-2 minutes. 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 The patient referred reports that the patient stop breathing and then with gas and it would stop breathing and then grab throat. He is unsure if this is part of the seizure but he was concerned.The patient reports that she does not number any of these things happening. EMS did an ammonia under the patient's nose and her breathing did normalize. According to the spell she had another seizure after that as well. According to the patient she has 2-3 seizures today. Some of them are psychogenic in due to stress and some of them are due to her epilepsy. The patient did not turn blue but did have a few episodes of turning red. The patient's significant other reports that there were a few times where he thought she stopped breathing for much longer but again she never turned colors and her heart never stopped. She's had a bit of a cough with cold but no urinary symptoms has been taking her medicines regularly and has no other complaints. The patient is here for evaluation.   Past Medical History  Diagnosis Date  . Chronic knee pain   . Chronic headache   . Noncompliance with medications   . Pseudoseizures   . Anxiety   . Seizures (HCC)   . Hx of electroencephalogram 12/2013 ([facility])    normal, dx non-epileptic seizures  . Hypoglycemia   . PTSD (post-traumatic stress disorder)     There are no  active problems to display for this patient.   Past Surgical History  Procedure Laterality Date  . Wisdom tooth extraction      Current Outpatient Rx  Name  Route  Sig  Dispense  Refill  . clonazePAM (KLONOPIN) 0.5 MG tablet   Oral   Take 1 mg by mouth 3 (three) times daily.          . cyclobenzaprine (FLEXERIL) 5 MG tablet   Oral   Take 1 tablet (5 mg total) by mouth every 8 (eight) hours as needed for muscle spasms.   20 tablet   0   . doxycycline (VIBRAMYCIN) 100 MG capsule   Oral   Take 1 capsule (100 mg total) by mouth 2 (two) times daily. One po bid x 7 days   14 capsule   0   . escitalopram (LEXAPRO) 10 MG tablet   Oral   Take 10 mg by mouth daily.         Marland Kitchen levETIRAcetam (KEPPRA XR) 500 MG 24 hr tablet   Oral   Take 2 tablets (1,000 mg total) by mouth daily. Patient taking differently: Take 2,000 mg by mouth daily.    60 tablet   0   . oxyCODONE-acetaminophen (ROXICET) 5-325 MG tablet   Oral   Take 1 tablet by mouth every 4 (four) hours as needed for severe pain.   10 tablet   0   . Vortioxetine HBr (TRINTELLIX) 5 MG  TABS   Oral   Take 5 mg by mouth daily.           Allergies Dilantin; Amoxicillin; Amoxicillin-pot clavulanate; Influenza vaccines; Metronidazole; Penicillins; Latex; and Trintellix  Family History  Problem Relation Age of Onset  . Diabetes Father     Social History Social History  Substance Use Topics  . Smoking status: Current Every Day Smoker -- 0.50 packs/day    Types: Cigarettes  . Smokeless tobacco: None  . Alcohol Use: No     Comment: occasional    Review of Systems Constitutional: No fever/chills Eyes: No visual changes. ENT: No sore throat. Cardiovascular: Denies chest pain. Respiratory:  shortness of breath. Gastrointestinal: No abdominal pain.  No nausea, no vomiting.  No diarrhea.  No constipation. Genitourinary: Negative for dysuria. Musculoskeletal: Negative for back pain. Skin: Negative for  rash. Neurological: seizure  10-point ROS otherwise negative.  ____________________________________________   PHYSICAL EXAM:  VITAL SIGNS: ED Triage Vitals  Enc Vitals Group     BP 05/08/15 0012 107/70 mmHg     Pulse Rate 05/08/15 0012 56     Resp 05/08/15 0012 18     Temp 05/08/15 0012 97.7 F (36.5 C)     Temp Source 05/08/15 0012 Oral     SpO2 05/08/15 0012 100 %     Weight 05/08/15 0012 134 lb (60.782 kg)     Height 05/08/15 0012  (1.6 m)     Head Cir --      Peak Flow --      Pain Score --      Pain Loc --      Pain Edu? --      Excl. in GC? --     Constitutional: Alert and oriented. Well appearing and in no acute distress. Eyes: Conjunctivae are normal. PERRL. EOMI. Head: Atraumatic. Nose: No congestion/rhinnorhea. Mouth/Throat: Mucous membranes are moist.  Oropharynx non-erythematous. Cardiovascular: Normal rate, regular rhythm. Grossly normal heart sounds.  Good peripheral circulation. Respiratory: Normal respiratory effort.  No retractions. Lungs CTAB. Gastrointestinal: Soft and nontender. No distention. Positive bowel sounds Musculoskeletal: No lower extremity tenderness nor edema.   Neurologic:  Normal speech and language. No gross focal neurologic deficits are appreciated.  Skin:  Skin is warm, dry and intact.  Psychiatric: Mood and affect are normal.   ____________________________________________   LABS (all labs ordered are listed, but only abnormal results are displayed)  Labs Reviewed  BASIC METABOLIC PANEL - Abnormal; Notable for the following:    Potassium 3.3 (*)    Calcium 8.6 (*)    All other components within normal limits  URINALYSIS COMPLETEWITH MICROSCOPIC ([facility] ONLY) - Abnormal; Notable for the following:    Color, Urine STRAW (*)    APPearance CLEAR (*)    Specific Gravity, Urine 1.002 (*)    Hgb urine dipstick 1+ (*)    Squamous Epithelial / LPF 0-5 (*)    All other components within normal limits  CBC  URINE DRUG SCREEN,  QUALITATIVE ([facility] ONLY)  POCT PREGNANCY, URINE  POC URINE PREG, ED   ____________________________________________  EKG  ED ECG REPORT I, Rebecka Apley, the attending physician, personally viewed and interpreted this ECG.   Date: 05/08/2015  EKG Time: 0025  Rate: 59  Rhythm: normal sinus rhythm  Axis: normal  Intervals:none  ST&T Change: none  ____________________________________________  RADIOLOGY  CXR: No acute cardiopulmonary process seen ____________________________________________   PROCEDURES  Procedure(s) performed: None  Critical Care performed: No  ____________________________________________  INITIAL IMPRESSION / ASSESSMENT AND PLAN / ED COURSE  Pertinent labs & imaging results that were available during my care of the patient were reviewed by me and considered in my medical decision making (see chart for details).  This is a 25 year old female who comes into the hospital today with seizure. The patient has a history of pseudoseizures as well as epileptic seizures. The patient reports that due to her stress she sometimes have these pseudoseizures but they have not found a good cocktail to help to control them. The patient had no seizure activity in the time that she was in the emergency department. She reports that she's been taking her Keppra on a regular basis and has not missed any doses. The patient also reports that she takes Klonopin as well as Lexapro to help with the pseudoseizures but they have not been helping and she still has some multiple times a day. She did receive a chest x-ray which is negative and the remainder of her blood work was unremarkable at this time. I feel the patient is to follow-up with her neurologist or her psychologist to determine if she needs any new medications or something further. Otherwise the patient is calm without any concerns or complaints. She'll be discharged home to follow-up with her  neurologist. ____________________________________________   FINAL CLINICAL IMPRESSION(S) / ED DIAGNOSES  Final diagnoses:  Seizure (HCC)      Rebecka ApleyAllison P Webster, MD 05/08/15 [phone]

## 2015-05-08 NOTE — ED Notes (Signed)
MD Webster at bedside 

## 2015-05-08 NOTE — ED Notes (Signed)
Lab called regarding UA add on, will add on at this time

## 2015-05-08 NOTE — ED Notes (Addendum)
Pt to ED from home via EMS, family called 911 due to "seizure like activity" Per EMS pt AAOx4 upon arrival, purposeful movements noted, responded to ammonia inhaler. Upon arrival to ED pt AAOx4, ambulatory, vitals WNL at this time.  Pt with hx of seizures, anxiety, and PTSD. Currently taking Keppra.

## 2015-05-08 NOTE — Discharge Instructions (Signed)

## 2015-05-29 ENCOUNTER — Emergency Department (HOSPITAL_COMMUNITY): Payer: Self-pay

## 2015-05-29 ENCOUNTER — Emergency Department (HOSPITAL_COMMUNITY)
Admission: EM | Admit: 2015-05-29 | Discharge: 2015-05-29 | Disposition: A | Discharge status: Home / Self Care | Payer: Self-pay | Attending: Emergency Medicine | Admitting: Emergency Medicine

## 2015-05-29 ENCOUNTER — Encounter (HOSPITAL_COMMUNITY): Payer: Self-pay | Admitting: *Deleted

## 2015-05-29 DIAGNOSIS — E162 Hypoglycemia, unspecified: Secondary | ICD-10-CM | POA: Insufficient documentation

## 2015-05-29 DIAGNOSIS — G40909 Epilepsy, unspecified, not intractable, without status epilepticus: Secondary | ICD-10-CM | POA: Insufficient documentation

## 2015-05-29 DIAGNOSIS — R1031 Right lower quadrant pain: Secondary | ICD-10-CM

## 2015-05-29 DIAGNOSIS — K5900 Constipation, unspecified: Secondary | ICD-10-CM | POA: Insufficient documentation

## 2015-05-29 DIAGNOSIS — F445 Conversion disorder with seizures or convulsions: Secondary | ICD-10-CM

## 2015-05-29 DIAGNOSIS — F17210 Nicotine dependence, cigarettes, uncomplicated: Secondary | ICD-10-CM | POA: Insufficient documentation

## 2015-05-29 DIAGNOSIS — Z79899 Other long term (current) drug therapy: Secondary | ICD-10-CM | POA: Insufficient documentation

## 2015-05-29 LAB — COMPREHENSIVE METABOLIC PANEL
ALBUMIN: 3.8 g/dL (ref 3.5–5.0)
ALK PHOS: 59 U/L (ref 38–126)
ALT: 14 U/L (ref 14–54)
ANION GAP: 7 (ref 5–15)
AST: 17 U/L (ref 15–41)
BILIRUBIN TOTAL: 0.1 mg/dL — AB (ref 0.3–1.2)
BUN: 6 mg/dL (ref 6–20)
CALCIUM: 8.2 mg/dL — AB (ref 8.9–10.3)
CO2: 23 mmol/L (ref 22–32)
Chloride: 110 mmol/L (ref 101–111)
Creatinine, Ser: 0.62 mg/dL (ref 0.44–1.00)
GFR calc Af Amer: >60 mL/min (ref >60)
GFR calc non Af Amer: >60 mL/min (ref >60)
Glucose, Bld: 87 mg/dL (ref 65–99)
Potassium: 3.7 mmol/L (ref 3.5–5.1)
SODIUM: 140 mmol/L (ref 135–145)
Total Protein: 6.6 g/dL (ref 6.5–8.1)

## 2015-05-29 LAB — URINE MICROSCOPIC-ADD ON: WBC, UA: NONE SEEN WBC/hpf (ref 0–5)

## 2015-05-29 LAB — CBC WITH DIFFERENTIAL/PLATELET
Basophils Absolute: 0 10*3/uL (ref 0.0–0.1)
Basophils Relative: 1 %
EOS ABS: 0.1 10*3/uL (ref 0.0–0.7)
Eosinophils Relative: 1 %
HEMATOCRIT: 37.3 % (ref 36.0–46.0)
HEMOGLOBIN: 12.8 g/dL (ref 12.0–15.0)
LYMPHS ABS: 4 10*3/uL (ref 0.7–4.0)
Lymphocytes Relative: 46 %
MCH: 30.3 pg (ref 26.0–34.0)
MCHC: 34.3 g/dL (ref 30.0–36.0)
MCV: 88.4 fL (ref 78.0–100.0)
MONO ABS: 0.5 10*3/uL (ref 0.1–1.0)
MONOS PCT: 5 %
NEUTROS ABS: 4 10*3/uL (ref 1.7–7.7)
NEUTROS PCT: 47 %
Platelets: 308 10*3/uL (ref 150–400)
RBC: 4.22 MIL/uL (ref 3.87–5.11)
RDW: 13.6 % (ref 11.5–15.5)
WBC: 8.6 10*3/uL (ref 4.0–10.5)

## 2015-05-29 LAB — URINALYSIS, ROUTINE W REFLEX MICROSCOPIC
BILIRUBIN URINE: NEGATIVE
Glucose, UA: NEGATIVE mg/dL
Ketones, ur: NEGATIVE mg/dL
Leukocytes, UA: NEGATIVE
Nitrite: NEGATIVE
PH: 5.5 (ref 5.0–8.0)
Protein, ur: NEGATIVE mg/dL

## 2015-05-29 LAB — POC URINE PREG, ED: Preg Test, Ur: NEGATIVE

## 2015-05-29 LAB — LIPASE, BLOOD: Lipase: 23 U/L (ref 11–51)

## 2015-05-29 MED ORDER — SODIUM CHLORIDE 0.9 % IV BOLUS (SEPSIS)
1000.0000 mL | Freq: Once | INTRAVENOUS | Status: AC
  Administered 2015-05-29: 1000 mL via INTRAVENOUS

## 2015-05-29 MED ORDER — LORAZEPAM 2 MG/ML IJ SOLN
INTRAMUSCULAR | Status: AC
  Filled 2015-05-29: qty 1

## 2015-05-29 MED ORDER — FENTANYL CITRATE (PF) 100 MCG/2ML IJ SOLN
50.0000 ug | Freq: Once | INTRAMUSCULAR | Status: AC
  Administered 2015-05-29: 50 ug via INTRAVENOUS
  Filled 2015-05-29: qty 2

## 2015-05-29 MED ORDER — ONDANSETRON HCL 4 MG/2ML IJ SOLN
4.0000 mg | Freq: Once | INTRAMUSCULAR | Status: AC
  Administered 2015-05-29: 4 mg via INTRAVENOUS
  Filled 2015-05-29: qty 2

## 2015-05-29 MED ORDER — LORAZEPAM 2 MG/ML IJ SOLN
0.5000 mg | Freq: Once | INTRAMUSCULAR | Status: AC
  Administered 2015-05-29: 0.5 mg via INTRAVENOUS

## 2015-05-29 NOTE — ED Notes (Signed)
Dr Knapp at bedside,  

## 2015-05-29 NOTE — ED Notes (Signed)
Called into pt's room, pt having a "seizure" involving "full body" , not responding to verbal stimuli by RN, Dr Lynelle DoctorKNapp notified, after Dr Lynelle DoctorKnapp arrival in room, pt alert, oriented able to answer questions, complains of pain in right lower quad that has now changed to hurting all the time,

## 2015-05-29 NOTE — ED Notes (Addendum)
[facility] of right side abd pain that started yesterday and seizure activity that started tonight, has hx of seizures, reports that she has a seizure normally everyday, advises that her neurologist sent her to a psychiatrist for further management.

## 2015-05-29 NOTE — Discharge Instructions (Signed)
Get miralax and put one dose or 17 g in 8 ounces of water,  take 1 dose every 30 minutes for 2-3 hours or until you  get good results and then once or twice daily to prevent constipation.  ° ° °Constipation, Adult °Constipation is when a person: °· Poops (has a bowel movement) less than 3 times a week. °· Has a hard time pooping. °· Has poop that is dry, hard, or bigger than normal. °HOME CARE  °· Eat foods with a lot of fiber in them. This includes fruits, vegetables, beans, and whole grains such as brown rice. °· Avoid fatty foods and foods with a lot of sugar. This includes french fries, hamburgers, cookies, candy, and soda. °· If you are not getting enough fiber from food, take products with added fiber in them (supplements). °· Drink enough fluid to keep your pee (urine) clear or pale yellow. °· Exercise on a regular basis, or as told by your doctor. °· Go to the restroom when you feel like you need to poop. Do not hold it. °· Only take medicine as told by your doctor. Do not take medicines that help you poop (laxatives) without talking to your doctor first. °GET HELP RIGHT AWAY IF:  °· You have bright red blood in your poop (stool). °· Your constipation lasts more than 4 days or gets worse. °· You have belly (abdominal) or butt (rectal) pain. °· You have thin poop (as thin as a pencil). °· You lose weight, and it cannot be explained. °MAKE SURE YOU:  °· Understand these instructions. °· Will watch your condition. °· Will get help right away if you are not doing well or get worse. °  °This information is not intended to replace advice given to you by your health care provider. Make sure you discuss any questions you have with your health care provider. °  °Document Released: 07/29/2007 Document Revised: 03/02/2014 Document Reviewed: 11/21/2012 °Elsevier Interactive Patient Education ©2016 [employer] ° ° °

## 2015-05-29 NOTE — ED Provider Notes (Signed)
CSN: [id number]     Arrival date & time 05/29/15  0349 History   First MD Initiated Contact with Patient 05/29/15 0410     Chief Complaint  Patient presents with  . Abdominal Pain     (Consider location/radiation/quality/duration/timing/severity/associated sxs/prior Treatment) HPI patient has a history of pseudoseizures and within about 20 minutes of arriving to the ED she had a seizure. When I entered the room she was stiff and she was bubbling at the mouth however as soon as I did a sternal rub and said her name she stopped the activity and opened her eyes and stated "that made my side hurt more".   Patient states yesterday April 4 she started having dull pain in her right lateral abdomen that has started radiating into her right flank. She states it hurts more with movement however since she had her seizure in the ED it is now constant. She has nausea since the seizure and now is having dry heaves. She denies fever. She states she ate normally yesterday. She denies dysuria, frequency, hematuria. She states she thought maybe it was menstrual cramps when it started. She states she's never had the pain before.  PCP Dr Quintin Alto   Past Medical History  Diagnosis Date  . Chronic knee pain   . Chronic headache   . Noncompliance with medications   . Pseudoseizures   . Anxiety   . Seizures (HCC)   . Hx of electroencephalogram 12/2013 ([facility])    normal, dx non-epileptic seizures  . Hypoglycemia   . PTSD (post-traumatic stress disorder)    Past Surgical History  Procedure Laterality Date  . Wisdom tooth extraction     Family History  Problem Relation Age of Onset  . Diabetes Father    Social History  Substance Use Topics  . Smoking status: Current Every Day Smoker -- 0.50 packs/day    Types: Cigarettes  . Smokeless tobacco: None  . Alcohol Use: No     Comment: occasional   OB History    Gravida Para Term Preterm AB TAB SAB Ectopic Multiple Living   0              Review of  Systems  All other systems reviewed and are negative.     Allergies  Dilantin; Amoxicillin; Amoxicillin-pot clavulanate; Bee venom; Influenza vaccines; Metronidazole; Penicillins; Tape; Latex; and Trintellix  Home Medications   Prior to Admission medications   Medication Sig Start Date End Date Taking? Authorizing Provider  clonazePAM (KLONOPIN) 0.5 MG tablet Take 1 mg by mouth 3 (three) times daily.    Yes Historical Provider, MD  escitalopram (LEXAPRO) 10 MG tablet Take 10 mg by mouth daily.   Yes Historical Provider, MD  levETIRAcetam (KEPPRA XR) 500 MG 24 hr tablet Take 2 tablets (1,000 mg total) by mouth daily. Patient taking differently: Take 2,000 mg by mouth daily.  04/07/13  Yes Tammy Triplett, PA-C  cyclobenzaprine (FLEXERIL) 5 MG tablet Take 1 tablet (5 mg total) by mouth every 8 (eight) hours as needed for muscle spasms. 02/26/15 02/26/16  Phineas Semen, MD  doxycycline (VIBRAMYCIN) 100 MG capsule Take 1 capsule (100 mg total) by mouth 2 (two) times daily. One po bid x 7 days 01/19/15   Dione Booze, MD  oxyCODONE-acetaminophen (ROXICET) 5-325 MG tablet Take 1 tablet by mouth every 4 (four) hours as needed for severe pain. 03/24/15   Irean Hong, MD  Vortioxetine HBr (TRINTELLIX) 5 MG TABS Take 5 mg by mouth daily.  Historical Provider, MD   BP 109/84 mmHg  Pulse 70  Temp(Src) 98 F (36.7 C) (Oral)  Resp 15  Ht  (1.6 m)  Wt 132 lb (59.875 kg)  BMI 23.39 kg/m2  SpO2 98%  LMP 03/25/2015  Vital signs normal   Physical Exam  Constitutional: She is oriented to person, place, and time. She appears well-developed and well-nourished.  Non-toxic appearance. She does not appear ill. No distress.  HENT:  Head: Normocephalic and atraumatic.  Right Ear: External ear normal.  Left Ear: External ear normal.  Nose: Nose normal. No mucosal edema or rhinorrhea.  Mouth/Throat: Oropharynx is clear and moist and mucous membranes are normal. No dental abscesses or uvula swelling.    Eyes: Conjunctivae and EOM are normal. Pupils are equal, round, and reactive to light.  Neck: Normal range of motion and full passive range of motion without pain. Neck supple.  Cardiovascular: Normal rate, regular rhythm and normal heart sounds.  Exam reveals no gallop and no friction rub.   No murmur heard. Pulmonary/Chest: Effort normal and breath sounds normal. No respiratory distress. She has no wheezes. She has no rhonchi. She has no rales. She exhibits no tenderness and no crepitus.  Abdominal: Soft. Normal appearance and bowel sounds are normal. She exhibits no distension. There is no tenderness. There is no rebound and no guarding.    Very prominent tenderness to even light touch in her lower right abdomen and right flank  Musculoskeletal: Normal range of motion. She exhibits no edema or tenderness.  Moves all extremities well.   Neurological: She is alert and oriented to person, place, and time. She has normal strength. No cranial nerve deficit.  Skin: Skin is warm, dry and intact. No rash noted. No erythema. No pallor.  Psychiatric: She has a normal mood and affect. Her speech is normal and behavior is normal. Her mood appears not anxious.  Nursing note and vitals reviewed.   ED Course  Procedures (including critical care time)  Medications  LORazepam (ATIVAN) injection 0.5 mg (0.5 mg Intravenous Given 05/29/15 0413)  sodium chloride 0.9 % bolus 1,000 mL (0 mLs Intravenous Stopped 05/29/15 0511)  fentaNYL (SUBLIMAZE) injection 50 mcg (50 mcg Intravenous Given 05/29/15 0430)  ondansetron (ZOFRAN) injection 4 mg (4 mg Intravenous Given 05/29/15 0429)   Patient sitting seizure resolved quickly by calling her name. She was given Ativan 1/2 mg afterward. Also this will prefer any further seizure activity. She was given fentanyl and Zofran for her abdominal pain. CT scan of the abdomen was ordered. Initially she was consistent with either kidney stone or possible atypical  appendicitis.  6:25 AM patient was given her CT results. I reviewed her CT scan and she does have a lot of stool throughout her colon. She does admit now she has not had a bowel movement in several days. She was advised to take MiraLAX over-the-counter on an accelerated dosing until she gets good results.    Labs Review Results for orders placed or performed during the hospital encounter of 05/29/15  Comprehensive metabolic panel  Result Value Ref Range   Sodium 140 135 - 145 mmol/L   Potassium 3.7 3.5 - 5.1 mmol/L   Chloride 110 101 - 111 mmol/L   CO2 23 22 - 32 mmol/L   Glucose, Bld 87 65 - 99 mg/dL   BUN 6 6 - 20 mg/dL   Creatinine, Ser 4.09 0.44 - 1.00 mg/dL   Calcium 8.2 (L) 8.9 - 10.3 mg/dL  Total Protein 6.6 6.5 - 8.1 g/dL   Albumin 3.8 3.5 - 5.0 g/dL   AST 17 15 - 41 U/L   ALT 14 14 - 54 U/L   Alkaline Phosphatase 59 38 - 126 U/L   Total Bilirubin 0.1 (L) 0.3 - 1.2 mg/dL   GFR calc non Af Amer >60 >60 mL/min   GFR calc Af Amer >60 >60 mL/min   Anion gap 7 5 - 15  Lipase, blood  Result Value Ref Range   Lipase 23 11 - 51 U/L  CBC with Differential  Result Value Ref Range   WBC 8.6 4.0 - 10.5 K/uL   RBC 4.22 3.87 - 5.11 MIL/uL   Hemoglobin 12.8 12.0 - 15.0 g/dL   HCT 16.1 09.6 - 04.5 %   MCV 88.4 78.0 - 100.0 fL   MCH 30.3 26.0 - 34.0 pg   MCHC 34.3 30.0 - 36.0 g/dL   RDW 40.9 81.1 - 91.4 %   Platelets 308 150 - 400 K/uL   Neutrophils Relative % 47 %   Neutro Abs 4.0 1.7 - 7.7 K/uL   Lymphocytes Relative 46 %   Lymphs Abs 4.0 0.7 - 4.0 K/uL   Monocytes Relative 5 %   Monocytes Absolute 0.5 0.1 - 1.0 K/uL   Eosinophils Relative 1 %   Eosinophils Absolute 0.1 0.0 - 0.7 K/uL   Basophils Relative 1 %   Basophils Absolute 0.0 0.0 - 0.1 K/uL  Urinalysis, Routine w reflex microscopic  Result Value Ref Range   Color, Urine YELLOW YELLOW   APPearance CLEAR CLEAR   Specific Gravity, Urine >1.030 (H) 1.005 - 1.030   pH 5.5 5.0 - 8.0   Glucose, UA NEGATIVE NEGATIVE  mg/dL   Hgb urine dipstick LARGE (A) NEGATIVE   Bilirubin Urine NEGATIVE NEGATIVE   Ketones, ur NEGATIVE NEGATIVE mg/dL   Protein, ur NEGATIVE NEGATIVE mg/dL   Nitrite NEGATIVE NEGATIVE   Leukocytes, UA NEGATIVE NEGATIVE  Urine microscopic-add on  Result Value Ref Range   Squamous Epithelial / LPF TOO NUMEROUS TO COUNT (A) NONE SEEN   WBC, UA NONE SEEN 0 - 5 WBC/hpf   RBC / HPF TOO NUMEROUS TO COUNT 0 - 5 RBC/hpf   Bacteria, UA MANY (A) NONE SEEN  POC Urine Pregnancy, ED (do NOT order at [facility])  Result Value Ref Range   Preg Test, Ur NEGATIVE NEGATIVE   Laboratory interpretation all normal except contaminated urine with hematuria, probably menstrual blood     Imaging Review Ct Renal Stone Study  05/29/2015  CLINICAL DATA:  Right flank pain radiating into the right lower quadrant. EXAM: CT ABDOMEN AND PELVIS WITHOUT CONTRAST TECHNIQUE: Multidetector CT imaging of the abdomen and pelvis was performed following the standard protocol without IV contrast. COMPARISON:  None. FINDINGS: Lower chest: Trace dependent atelectasis in the left lower lobe. No pleural effusion. Liver: No focal lesion allowing for lack contrast. Hepatobiliary: Gallbladder physiologically distended, no calcified stone. No biliary dilatation. Pancreas: No ductal dilatation or inflammation. Spleen: Normal. Adrenal glands: No nodule. Kidneys: Symmetric in size without stones or hydronephrosis. There is no perinephric stranding. Both ureters are decompressed without stones along the course. Stomach/Bowel: Stomach physiologically distended. There are no dilated or thickened small bowel loops. Moderate diffuse volume of stool throughout the colon without colonic wall thickening. The appendix is normal. Vascular/Lymphatic: No retroperitoneal adenopathy. Abdominal aorta is normal in caliber. Reproductive: Uterus is anteverted. Ovaries tentatively identified and symmetric in size. Pelvic viscera evaluation limited by lack  of contrast.  Bladder: Completely decompressed. Other: No free air, free fluid, or intra-abdominal fluid collection. Musculoskeletal: There are no acute or suspicious osseous abnormalities. Hemi transitional lumbosacral anatomy. IMPRESSION: No acute abnormality in the abdomen/pelvis. No urolithiasis or obstructive uropathy. Normal appendix. Electronically Signed   By: Rubye OaksMelanie  Ehinger M.D.   On: 05/29/2015 06:13   I have personally reviewed and evaluated these images and lab results as part of my medical decision-making.    MDM   Final diagnoses:  Right lower quadrant abdominal pain  Constipation, unspecified constipation type  Pseudoseizure (HCC)    meds OTC miralax   Plan discharge  Devoria AlbeIva Ambriella Kitt, MD, Concha PyoFACEP      Symphany Fleissner, MD 05/29/15 [phone]

## 2015-05-29 NOTE — ED Notes (Signed)
Ems reports that on their arrival to pt, family reported that pt had a seizure that involved "full body" and lasted about 15 minutes, pt was post ictal on ems arrival, on arrival to er pt alert, oriented, able to answer all questions, ems also reports that pt has "two small seizures" with them,

## 2015-05-29 NOTE — ED Notes (Signed)
Pt family updated,

## 2015-07-10 ENCOUNTER — Emergency Department (HOSPITAL_COMMUNITY)
Admission: EM | Admit: 2015-07-10 | Discharge: 2015-07-10 | Disposition: A | Discharge status: Home / Self Care | Payer: Medicaid Other | Attending: Emergency Medicine | Admitting: Emergency Medicine

## 2015-07-10 ENCOUNTER — Encounter (HOSPITAL_COMMUNITY): Payer: Self-pay | Admitting: *Deleted

## 2015-07-10 DIAGNOSIS — Z79899 Other long term (current) drug therapy: Secondary | ICD-10-CM | POA: Insufficient documentation

## 2015-07-10 DIAGNOSIS — G4089 Other seizures: Secondary | ICD-10-CM | POA: Diagnosis not present

## 2015-07-10 DIAGNOSIS — F17210 Nicotine dependence, cigarettes, uncomplicated: Secondary | ICD-10-CM | POA: Insufficient documentation

## 2015-07-10 DIAGNOSIS — F445 Conversion disorder with seizures or convulsions: Secondary | ICD-10-CM

## 2015-07-10 LAB — CBC WITH DIFFERENTIAL/PLATELET
BASOS ABS: 0.1 10*3/uL (ref 0.0–0.1)
BASOS PCT: 1 %
EOS PCT: 1 %
Eosinophils Absolute: 0.1 10*3/uL (ref 0.0–0.7)
HCT: 41.6 % (ref 36.0–46.0)
Hemoglobin: 14.2 g/dL (ref 12.0–15.0)
Lymphocytes Relative: 48 %
Lymphs Abs: 4.1 10*3/uL — ABNORMAL HIGH (ref 0.7–4.0)
MCH: 30.3 pg (ref 26.0–34.0)
MCHC: 34.1 g/dL (ref 30.0–36.0)
MCV: 88.9 fL (ref 78.0–100.0)
MONO ABS: 0.4 10*3/uL (ref 0.1–1.0)
Monocytes Relative: 4 %
NEUTROS ABS: 3.9 10*3/uL (ref 1.7–7.7)
Neutrophils Relative %: 46 %
PLATELETS: 323 10*3/uL (ref 150–400)
RBC: 4.68 MIL/uL (ref 3.87–5.11)
RDW: 13 % (ref 11.5–15.5)
WBC: 8.6 10*3/uL (ref 4.0–10.5)

## 2015-07-10 LAB — URINALYSIS, ROUTINE W REFLEX MICROSCOPIC
Bilirubin Urine: NEGATIVE
GLUCOSE, UA: NEGATIVE mg/dL
Ketones, ur: NEGATIVE mg/dL
LEUKOCYTES UA: NEGATIVE
NITRITE: NEGATIVE
PH: 6 (ref 5.0–8.0)
Protein, ur: NEGATIVE mg/dL
SPECIFIC GRAVITY, URINE: 1.005 (ref 1.005–1.030)

## 2015-07-10 LAB — BASIC METABOLIC PANEL
ANION GAP: 6 (ref 5–15)
BUN: 7 mg/dL (ref 6–20)
CALCIUM: 9 mg/dL (ref 8.9–10.3)
CO2: 26 mmol/L (ref 22–32)
Chloride: 104 mmol/L (ref 101–111)
Creatinine, Ser: 0.61 mg/dL (ref 0.44–1.00)
GFR calc Af Amer: >60 mL/min (ref >60)
GLUCOSE: 90 mg/dL (ref 65–99)
Potassium: 3.7 mmol/L (ref 3.5–5.1)
Sodium: 136 mmol/L (ref 135–145)

## 2015-07-10 LAB — URINE MICROSCOPIC-ADD ON

## 2015-07-10 LAB — PREGNANCY, URINE: PREG TEST UR: NEGATIVE

## 2015-07-10 NOTE — ED Notes (Signed)
Pt ambulated to restroom & returned to room w/ no complications. 

## 2015-07-10 NOTE — ED Notes (Signed)
Drink provided

## 2015-07-10 NOTE — ED Notes (Signed)
Pt alert & oriented x4, stable gait. Patient given discharge instructions, paperwork & prescription(s). Patient  instructed to stop at the registration desk to finish any additional paperwork. Patient verbalized understanding. Pt left department w/ no further questions. 

## 2015-07-10 NOTE — ED Provider Notes (Signed)
CSN: [id number]     Arrival date & time 07/10/15  0100 History   None    Chief Complaint  Patient presents with  . Seizures     (Consider location/radiation/quality/duration/timing/severity/associated sxs/prior Treatment) HPI  This is a 25 year old female with a history of pseudoseizure and PTSD who presents with seizure-like activity. Patient was brought in by EMS. EMS and family reported patient had 3 disks date episodes of shaking possibly 1 minute. She was sleepy upon EMS arrival. She does take Keppra daily and reports compliance with her medications. 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 Patient is currently awake, alert, oriented. Reports headache. Denies any other physical complaints. Past Medical History  Diagnosis Date  . Chronic knee pain   . Chronic headache   . Noncompliance with medications   . Pseudoseizures   . Anxiety   . Seizures (HCC)   . Hx of electroencephalogram 12/2013 ([facility])    normal, dx non-epileptic seizures  . Hypoglycemia   . PTSD (post-traumatic stress disorder)    Past Surgical History  Procedure Laterality Date  . Wisdom tooth extraction     Family History  Problem Relation Age of Onset  . Diabetes Father    Social History  Substance Use Topics  . Smoking status: Current Every Day Smoker -- 0.50 packs/day    Types: Cigarettes  . Smokeless tobacco: None  . Alcohol Use: No     Comment: occasional   OB History    Gravida Para Term Preterm AB TAB SAB Ectopic Multiple Living   0              Review of Systems  Constitutional: Negative for fever.  Respiratory: Negative for shortness of breath.   Cardiovascular: Negative for chest pain.  Gastrointestinal: Negative for abdominal pain.  Neurological: Positive for seizures and headaches.  All other systems reviewed and are negative.     Allergies  Dilantin; Amoxicillin; Amoxicillin-pot clavulanate; Bee venom; Influenza vaccines; Metronidazole; Penicillins; Tape;  Latex; and Trintellix  Home Medications   Prior to Admission medications   Medication Sig Start Date End Date Taking? Authorizing Provider  clonazePAM (KLONOPIN) 0.5 MG tablet Take 1 mg by mouth 3 (three) times daily.    Yes Historical Provider, MD  cyclobenzaprine (FLEXERIL) 5 MG tablet Take 1 tablet (5 mg total) by mouth every 8 (eight) hours as needed for muscle spasms. 02/26/15 02/26/16 Yes Phineas SemenGraydon Goodman, MD  escitalopram (LEXAPRO) 10 MG tablet Take 10 mg by mouth daily.   Yes Historical Provider, MD  levETIRAcetam (KEPPRA XR) 500 MG 24 hr tablet Take 2 tablets (1,000 mg total) by mouth daily. Patient taking differently: Take 2,000 mg by mouth daily.  04/07/13  Yes Tammy Triplett, PA-C  doxycycline (VIBRAMYCIN) 100 MG capsule Take 1 capsule (100 mg total) by mouth 2 (two) times daily. One po bid x 7 days 01/19/15   Dione Boozeavid Glick, MD  oxyCODONE-acetaminophen (ROXICET) 5-325 MG tablet Take 1 tablet by mouth every 4 (four) hours as needed for severe pain. 03/24/15   Irean HongJade J Sung, MD  Vortioxetine HBr (TRINTELLIX) 5 MG TABS Take 5 mg by mouth daily.    Historical Provider, MD   BP 96/69 mmHg  Pulse 52  Temp(Src) 97.1 F (36.2 C) (Oral)  Resp 18  Ht 5\' 3"  (1.6 m)  Wt 135 lb (61.236 kg)  BMI 23.92 kg/m2  SpO2 98% Physical Exam  Constitutional: She is oriented to person, place, and time. She appears well-developed and well-nourished. No distress.  HENT:  Head: Normocephalic and atraumatic.  Eyes: Pupils are equal, round, and reactive to light.  Cardiovascular: Normal rate, regular rhythm and normal heart sounds.   Pulmonary/Chest: Effort normal and breath sounds normal. No respiratory distress. She has no wheezes.  Abdominal: Soft. Bowel sounds are normal. There is no tenderness. There is no rebound.  Musculoskeletal: She exhibits no edema.  Neurological: She is alert and oriented to person, place, and time.  Cranial nerves II through XII intact, 5 out of 5 strength in all 4 extremities,  normal gait  Skin: Skin is warm and dry.  Psychiatric: She has a normal mood and affect.  Nursing note and vitals reviewed.   ED Course  Procedures (including critical care time) Labs Review Labs Reviewed  CBC WITH DIFFERENTIAL/PLATELET - Abnormal; Notable for the following:    Lymphs Abs 4.1 (*)    All other components within normal limits  URINALYSIS, ROUTINE W REFLEX MICROSCOPIC (NOT AT [facility]) - Abnormal; Notable for the following:    Hgb urine dipstick TRACE (*)    All other components within normal limits  URINE MICROSCOPIC-ADD ON - Abnormal; Notable for the following:    Squamous Epithelial / LPF 0-5 (*)    Bacteria, UA FEW (*)    All other components within normal limits  BASIC METABOLIC PANEL  PREGNANCY, URINE    Imaging Review No results found. I have personally reviewed and evaluated these images and lab results as part of my medical decision-making.   EKG Interpretation None      MDM   Final diagnoses:  Pseudoseizure [facility])   Patient presents with seizure-like activities. History of pseudoseizures. Currently neurologically intact. Basic labwork obtained. Patient had multiple episodes of shaking that appear to be pseudoseizures while in the emergency department. She was redirectable and had a normal mental status after these events. Lab work is reassuring. Suspect pseudoseizures at home. Will discharge home with primary care follow-up.  After history, exam, and medical workup I feel the patient has been appropriately medically screened and is safe for discharge home. Pertinent diagnoses were discussed with the patient. Patient was given return precautions.    Shon Baton, MD 07/10/15 [phone]

## 2015-07-10 NOTE — ED Notes (Signed)
Pt ambulated to restroom to collect urine sample.

## 2015-07-10 NOTE — ED Notes (Signed)
Pt started having a pseudo seizure when family walked into the room.

## 2015-07-10 NOTE — ED Notes (Signed)
Pt arrived by EMS from home was advised pt had 3 seizures at home lasting about 1 min.

## 2015-07-16 ENCOUNTER — Emergency Department
Admission: EM | Admit: 2015-07-16 | Discharge: 2015-07-16 | Disposition: A | Discharge status: Home / Self Care | Payer: Medicaid Other | Attending: Emergency Medicine | Admitting: Emergency Medicine

## 2015-07-16 DIAGNOSIS — Z8669 Personal history of other diseases of the nervous system and sense organs: Secondary | ICD-10-CM | POA: Insufficient documentation

## 2015-07-16 DIAGNOSIS — F17210 Nicotine dependence, cigarettes, uncomplicated: Secondary | ICD-10-CM | POA: Insufficient documentation

## 2015-07-16 DIAGNOSIS — Z91040 Latex allergy status: Secondary | ICD-10-CM | POA: Insufficient documentation

## 2015-07-16 DIAGNOSIS — Z79899 Other long term (current) drug therapy: Secondary | ICD-10-CM | POA: Insufficient documentation

## 2015-07-16 DIAGNOSIS — F488 Other specified nonpsychotic mental disorders: Secondary | ICD-10-CM | POA: Insufficient documentation

## 2015-07-16 DIAGNOSIS — F4310 Post-traumatic stress disorder, unspecified: Secondary | ICD-10-CM | POA: Insufficient documentation

## 2015-07-16 LAB — POCT PREGNANCY, URINE: Preg Test, Ur: NEGATIVE

## 2015-07-16 LAB — COMPREHENSIVE METABOLIC PANEL
ALBUMIN: 4.1 g/dL (ref 3.5–5.0)
ALK PHOS: 71 U/L (ref 38–126)
ALT: 11 U/L — AB (ref 14–54)
AST: 17 U/L (ref 15–41)
Anion gap: 6 (ref 5–15)
BILIRUBIN TOTAL: 0.2 mg/dL — AB (ref 0.3–1.2)
BUN: 6 mg/dL (ref 6–20)
CALCIUM: 9 mg/dL (ref 8.9–10.3)
CO2: 25 mmol/L (ref 22–32)
CREATININE: 0.65 mg/dL (ref 0.44–1.00)
Chloride: 107 mmol/L (ref 101–111)
GFR calc Af Amer: >60 mL/min (ref >60)
GFR calc non Af Amer: >60 mL/min (ref >60)
GLUCOSE: 86 mg/dL (ref 65–99)
Potassium: 3.6 mmol/L (ref 3.5–5.1)
SODIUM: 138 mmol/L (ref 135–145)
TOTAL PROTEIN: 6.8 g/dL (ref 6.5–8.1)

## 2015-07-16 LAB — CBC
HEMATOCRIT: 40.3 % (ref 35.0–47.0)
HEMOGLOBIN: 13.8 g/dL (ref 12.0–16.0)
MCH: 29.8 pg (ref 26.0–34.0)
MCHC: 34.2 g/dL (ref 32.0–36.0)
MCV: 87.2 fL (ref 80.0–100.0)
Platelets: 275 10*3/uL (ref 150–440)
RBC: 4.62 MIL/uL (ref 3.80–5.20)
RDW: 13.4 % (ref 11.5–14.5)
WBC: 8.9 10*3/uL (ref 3.6–11.0)

## 2015-07-16 LAB — URINE DRUG SCREEN, QUALITATIVE (ARMC ONLY)
Amphetamines, Ur Screen: NOT DETECTED
BARBITURATES, UR SCREEN: NOT DETECTED
Benzodiazepine, Ur Scrn: POSITIVE — AB
CANNABINOID 50 NG, UR ~~LOC~~: NOT DETECTED
COCAINE METABOLITE, UR ~~LOC~~: NOT DETECTED
MDMA (Ecstasy)Ur Screen: NOT DETECTED
METHADONE SCREEN, URINE: NOT DETECTED
OPIATE, UR SCREEN: NOT DETECTED
PHENCYCLIDINE (PCP) UR S: NOT DETECTED
Tricyclic, Ur Screen: NOT DETECTED

## 2015-07-16 LAB — ETHANOL: Alcohol, Ethyl (B): <5 mg/dL (ref <5)

## 2015-07-16 LAB — ACETAMINOPHEN LEVEL: Acetaminophen (Tylenol), Serum: <10 ug/mL — ABNORMAL LOW (ref 10–30)

## 2015-07-16 LAB — SALICYLATE LEVEL: Salicylate Lvl: <4 mg/dL (ref 2.8–30.0)

## 2015-07-16 NOTE — ED Notes (Signed)
Pt discharged to home.  Family member driving.  Discharge instructions reviewed.  Verbalized understanding.  No questions or concerns at this time.  Teach back verified.  Pt in NAD.  No items left in ED.   

## 2015-07-16 NOTE — ED Provider Notes (Signed)
[facility] Emergency Department Provider Note  ____________________________________________  Time seen: Approximately 3:22 AM  I have reviewed the triage vital signs and the nursing notes.   HISTORY  Chief Complaint Mental Health Problem    HPI Patricia Irwin is a 25 y.o. female who is a frequent visitor to this and other nearby emergency departments for psychiatric complaints including anxiety and PTSD as well as pseudoseizures.  Chest has a documented history of noncompliance with her medications.  She was seen within the last several days at [facility] for "syncopal episodes".  Her workup was normal at that time and she was discharged.  She goes to [facility] for psychiatric care but she has missed her last 2 visits.  She presents tonight by private vehicle because she would like a mental health evaluation stating that she is having worsening flashbacks and moments of memory loss in addition to the multiple syncopal episodes. She reports symptoms are gradual in onset and moderate.  She states that "I believe the episodes are psychogenic."  She denies SI/HI and hallucinations.   Past Medical History  Diagnosis Date  . Chronic knee pain   . Chronic headache   . Noncompliance with medications   . Pseudoseizures   . Anxiety   . Seizures (HCC)   . Hx of electroencephalogram 12/2013 ([facility])    normal, dx non-epileptic seizures  . Hypoglycemia   . PTSD (post-traumatic stress disorder)     There are no active problems to display for this patient.   Past Surgical History  Procedure Laterality Date  . Wisdom tooth extraction      Current Outpatient Rx  Name  Route  Sig  Dispense  Refill  . clonazePAM (KLONOPIN) 0.5 MG tablet   Oral   Take 1 mg by mouth 3 (three) times daily.          . cyclobenzaprine (FLEXERIL) 5 MG tablet   Oral   Take 1 tablet (5 mg total) by mouth every 8 (eight) hours as needed for muscle spasms.   20 tablet   0   .  doxycycline (VIBRAMYCIN) 100 MG capsule   Oral   Take 1 capsule (100 mg total) by mouth 2 (two) times daily. One po bid x 7 days   14 capsule   0   . escitalopram (LEXAPRO) 10 MG tablet   Oral   Take 10 mg by mouth daily.         Marland Kitchen levETIRAcetam (KEPPRA XR) 500 MG 24 hr tablet   Oral   Take 2 tablets (1,000 mg total) by mouth daily. Patient taking differently: Take 2,000 mg by mouth daily.    60 tablet   0   . oxyCODONE-acetaminophen (ROXICET) 5-325 MG tablet   Oral   Take 1 tablet by mouth every 4 (four) hours as needed for severe pain.   10 tablet   0   . Vortioxetine HBr (TRINTELLIX) 5 MG TABS   Oral   Take 5 mg by mouth daily.           Allergies Dilantin; Amoxicillin; Amoxicillin-pot clavulanate; Bee venom; Influenza vaccines; Metronidazole; Penicillins; Tape; Latex; and Trintellix  Family History  Problem Relation Age of Onset  . Diabetes Father     Social History Social History  Substance Use Topics  . Smoking status: Current Every Day Smoker -- 0.50 packs/day    Types: Cigarettes  . Smokeless tobacco: Not on file  . Alcohol Use: No  Comment: occasional    Review of Systems Constitutional: No fever/chills Eyes: No visual changes. ENT: No sore throat. Cardiovascular: Denies chest pain. Respiratory: Denies shortness of breath. Gastrointestinal: No abdominal pain.  No nausea, no vomiting.  No diarrhea.  No constipation. Genitourinary: Negative for dysuria. Musculoskeletal: Negative for back pain. Skin: Negative for rash. Neurological: Negative for headaches, focal weakness or numbness.  Multiple "syncopal" episodes recently.  10-point ROS otherwise negative.  ____________________________________________   PHYSICAL EXAM:  VITAL SIGNS: ED Triage Vitals  Enc Vitals Group     BP 07/16/15 0233 128/97 mmHg     Pulse Rate 07/16/15 0233 82     Resp 07/16/15 0233 20     Temp 07/16/15 0233 97.7 F (36.5 C)     Temp Source 07/16/15 0233 Oral       SpO2 07/16/15 0233 99 %     Weight 07/16/15 0233 135 lb (61.236 kg)     Height 07/16/15 0233 5\' 3"  (1.6 m)     Head Cir --      Peak Flow --      Pain Score 07/16/15 0233 8     Pain Loc --      Pain Edu? --      Excl. in GC? --     Constitutional: Alert and oriented. Well appearing and in no acute distress. Eyes: Conjunctivae are normal. PERRL. EOMI. Head: Atraumatic. Nose: No congestion/rhinnorhea. Mouth/Throat: Mucous membranes are moist.  Oropharynx non-erythematous. Neck: No stridor.  No meningeal signs.   Cardiovascular: Normal rate, regular rhythm. Good peripheral circulation. Grossly normal heart sounds.   Respiratory: Normal respiratory effort.  No retractions. Lungs CTAB. Gastrointestinal: Soft and nontender. No distention.  Musculoskeletal: No lower extremity tenderness nor edema. No gross deformities of extremities. Neurologic:  Normal speech and language. No gross focal neurologic deficits are appreciated.  Skin:  Skin is warm, dry and intact. No rash noted. Psychiatric: Mood and affect are normal. Speech and behavior are normal. Denies SI/HI.  ____________________________________________   LABS (all labs ordered are listed, but only abnormal results are displayed)  Labs Reviewed  COMPREHENSIVE METABOLIC PANEL - Abnormal; Notable for the following:    ALT 11 (*)    Total Bilirubin 0.2 (*)    All other components within normal limits  ACETAMINOPHEN LEVEL - Abnormal; Notable for the following:    Acetaminophen (Tylenol), Serum <10 (*)    All other components within normal limits  URINE DRUG SCREEN, QUALITATIVE ([facility] ONLY) - Abnormal; Notable for the following:    Benzodiazepine, Ur Scrn POSITIVE (*)    All other components within normal limits  ETHANOL  SALICYLATE LEVEL  CBC  POCT PREGNANCY, URINE  POC URINE PREG, ED   ____________________________________________  EKG  ED ECG REPORT I, Demario Faniel, the attending physician, personally viewed and  interpreted this ECG.  Date: 07/16/2015 EKG Time: 02:57 Rate: 63 Rhythm: normal sinus rhythm QRS Axis: normal Intervals: normal ST/T Wave abnormalities: normal Conduction Disturbances: none Narrative Interpretation: unremarkable  ____________________________________________  RADIOLOGY   No results found.  ____________________________________________   PROCEDURES  Procedure(s) performed: None  Critical Care performed: No ____________________________________________   INITIAL IMPRESSION / ASSESSMENT AND PLAN / ED COURSE  Pertinent labs & imaging results that were available during my care of the patient were reviewed by me and considered in my medical decision making (see chart for details).  Explained to the patient that we would do a normal laboratory workup and then consult psychiatry.  She asked me how  long it would take and I told her that it would be later today since it is currently 4:00 in the morning and thus she would likely be here for at least 12 hours.  She states that she does not want to stay and will probably leave after her blood work is back.  I encouraged her several times to go ahead and stay for the psychiatric evaluation but she does not want to do so because she does not want to be separated from her family.  She does have close outpatient follow-up available to her with [facility] and she states that she has an appointment scheduled in 2 days.  She does not meet criteria for involuntary commitment nor inpatient treatment at this time so I think it would be appropriate for her to follow up as an outpatient.   ----------------------------------------- 4:54 AM on 07/16/2015 -----------------------------------------  The patient's labs are unremarkable.  She wants to go and does not want to wait for psychiatry.  I will discharge her with recommendation for close outpatient follow-up at [facility].   ____________________________________________  FINAL CLINICAL  IMPRESSION(S) / ED DIAGNOSES  Final diagnoses:  Psychogenic syncope  PTSD (post-traumatic stress disorder)     MEDICATIONS GIVEN DURING THIS VISIT:  Medications - No data to display   NEW OUTPATIENT MEDICATIONS STARTED DURING THIS VISIT:  Discharge Medication List as of 07/16/2015  4:56 AM        Note:  This document was prepared using Dragon voice recognition software and may include unintentional dictation errors.   Loleta Roseory Jlynn Ly, MD 07/16/15 [phone]

## 2015-07-16 NOTE — ED Notes (Signed)
Pt states here for mental health, states having flash backs and moments of memory loss.  Pt states has been having syncopal episodes for a few weeks, tonight she was hitting herself in the head and scratching herself.

## 2015-07-16 NOTE — Discharge Instructions (Signed)
You have been seen in the Emergency Department (ED) today for a psychiatric complaint.  Your lab workup and ECG were normal, and you declined to stay to speak with the psychiatrist.  Please return to the ED immediately if you have ANY thoughts of hurting yourself or anyone else, so that we may help you.  Please avoid alcohol and drug use.  Follow up with your doctor and/or therapist as soon as possible regarding today's ED visit.   Please follow up any other recommendations and clinic appointments provided by the psychiatry team that saw you in the Emergency Department.   Somatic Symptom Disorder Somatic symptom disorder is a mental disorder. People with this disorder have physical (somatic) symptoms that cause distress or affect daily function. However, no other medical condition can be found to explain these symptoms. In addition, people with this disorder react to the somatic symptoms in a way that is out of proportion to the symptoms. The reaction may include:  Thinking all the time about the severity of the symptoms.  Feeling very anxious all the time about the symptoms or general health.  Spending a lot of time and energy dealing with the symptoms or health concerns. Somatic symptom disorder may interfere with relationships, work, school, or other daily activities. It may lead to frequent medical visits and many medical tests to determine the cause of symptoms. It may also lead to surgical procedures that do not help and can cause serious problems. People who have pain as the main or only symptom may become addicted to pain medicines. People with somatic symptom disorder are also at risk for alcohol or drug addiction, suicide attempts, and divorce. Somatic symptom disorder may start in childhood but is most common in young adults. The disorder may be triggered by stressful life events. It may last for several years or may come and go throughout life. RISK FACTORS Risk factors  include:  Being female. The disorder is more common in females than males.  Having a history of childhood abuse.  Having family members with the disorder. SIGNS AND SYMPTOMS Signs and symptoms of somatic symptom disorder may include:  Pain. Pain may involve any body part or organ. It may be the only somatic symptom present.  Stomach or intestinal symptoms. Examples include nausea, vomiting, bloating, diarrhea, or food intolerance.  Problems with sexual or reproductive function. This may include irregular or heavy periods in women and erectile problems in men. A person with this disorder may also have symptoms that suggest disorders of the brain or nervous system. Examples include:  Loss of balance.  Muscle weakness.  Difficulty urinating.  Difficulty swallowing or the feeling of having a lump in the throat.  Difficulty speaking.  Loss of the sensation of touch or pain.  Blindness or double vision.  Deafness.  Seizures. DIAGNOSIS Somatic symptom disorder is diagnosed through an evaluation by your health care provider. Exams and tests will be done to rule out serious physical health problems. The evaluation will vary depending on your specific symptoms. It may include:  A physical exam.  Lab tests on blood or urine samples.  X-rays or other imaging studies.  Other procedures. Your health care provider may refer you to a mental health specialist for psychological evaluation. Somatic symptoms can be related to a number of mental disorders. TREATMENT The most effective treatment for somatic symptom disorder is a combination of the following options:  Regular follow-up visits with your health care provider for evaluation and reassurance.  Counseling  or talk therapy.Talk therapy is provided bymental health specialists. The goals are to help you understand what triggers your symptoms and to help you learn coping skills.  Medicine. Certain medicines can help with severe  anxiety or depression related to somatic symptom disorder.  Healthy lifestyle.Balanced diet and regular exercise can reduce stress and somatic symptoms. HOME CARE INSTRUCTIONS  Take medicines only as directed by your health care provider.  Get regular exercise. Check with your health care provider before starting an exercise program.  Keep all follow-up visits as directed by your health care provider. This is important. SEEK MEDICAL CARE IF:  Your pain or symptoms do not go away or they become severe.  You develop new symptoms. SEEK IMMEDIATE MEDICAL CARE IF: You have serious thoughts about hurting yourself or someone else.   This information is not intended to replace advice given to you by your health care provider. Make sure you discuss any questions you have with your health care provider.   Document Released: 03/14/2010 Document Revised: 03/02/2014 Document Reviewed: 06/22/2013 Elsevier Interactive Patient Education [employer]2016 [employer]

## 2015-08-09 ENCOUNTER — Emergency Department (HOSPITAL_COMMUNITY)
Admission: EM | Admit: 2015-08-09 | Discharge: 2015-08-09 | Disposition: A | Discharge status: Home / Self Care | Payer: Medicaid Other | Attending: Emergency Medicine | Admitting: Emergency Medicine

## 2015-08-09 ENCOUNTER — Encounter (HOSPITAL_COMMUNITY): Payer: Self-pay | Admitting: Emergency Medicine

## 2015-08-09 DIAGNOSIS — F17210 Nicotine dependence, cigarettes, uncomplicated: Secondary | ICD-10-CM | POA: Insufficient documentation

## 2015-08-09 DIAGNOSIS — T24102A Burn of first degree of unspecified site of left lower limb, except ankle and foot, initial encounter: Secondary | ICD-10-CM

## 2015-08-09 DIAGNOSIS — Y929 Unspecified place or not applicable: Secondary | ICD-10-CM | POA: Insufficient documentation

## 2015-08-09 DIAGNOSIS — T24132A Burn of first degree of left lower leg, initial encounter: Secondary | ICD-10-CM | POA: Insufficient documentation

## 2015-08-09 DIAGNOSIS — Y999 Unspecified external cause status: Secondary | ICD-10-CM | POA: Insufficient documentation

## 2015-08-09 DIAGNOSIS — Y939 Activity, unspecified: Secondary | ICD-10-CM | POA: Insufficient documentation

## 2015-08-09 DIAGNOSIS — X088XXA Exposure to other specified smoke, fire and flames, initial encounter: Secondary | ICD-10-CM | POA: Insufficient documentation

## 2015-08-09 DIAGNOSIS — S90821A Blister (nonthermal), right foot, initial encounter: Secondary | ICD-10-CM

## 2015-08-09 NOTE — ED Notes (Signed)
Pt resting with eyes closed, appears to be in no distress. Respirations are even and unlabored.  

## 2015-08-09 NOTE — ED Notes (Signed)
Pt expressed understanding of discharge instructions,  
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
## 2015-08-09 NOTE — Discharge Instructions (Signed)
Blisters A blister is a fluid-filled sac that forms between layers of skin. Blisters often form in areas where skin rubs against other skin or rubs against something else. The most common areas for blisters are the hands and feet. CAUSES A blister can be caused by:  An injury.  A burn.  An allergic reaction.  An infection.  Exposure to irritating chemicals.  Friction. Friction blisters often result from:  Sports.  Repetitive activities.  Shoes that are too tight or too loose. SIGNS AND SYMPTOMS A blister is often round and looks like a bump. It may itch or be painful to the touch. The liquid in a blister is clear or bloody. Before a blister forms, the skin may become red, feel warm, itch, or be painful to the touch. DIAGNOSIS A blister can usually be diagnosed from its appearance. TREATMENT Treatment involves protecting the area where the blister has formed until the skin has healed. If something is likely to rub against the blister, apply a bandage (dressing) with a hole in the middle over the blister. Most blisters break open, dry up, and go away on their own within 10 days. Rarely, blisters that are very painful may be drained before they break open on their own. Draining of a blister should only be done by a health care provider under sterile conditions. HOME CARE INSTRUCTIONS  Protect the area where the blister has formed as directed by your health care provider.  Do not open or pop your blister, because it could become infected.  If the blister is very painful, ask your health care provider whether you should have it drained.  If the blister breaks open on its own:  Do not remove the loose skin that is over the blister.  Wash the blister area with soap and water every day.  After washing the blister area, you may apply an antibiotic cream or ointment and cover the area with a bandage. PREVENTION Taking these steps can help to prevent blisters that are caused by  friction:  Wear comfortable shoes that fit well.  Always wear socks with shoes.  Wear extra socks or use tape, bandages, or pads over blister-prone areas as needed.  Wear protective gear, such as gloves, when participating in sports or activities that can cause blisters.  Use powders as needed to keep your feet dry. SEEK MEDICAL CARE IF:  You have increased redness, swelling, or pain in the blister area.  A puslike discharge is coming from the blister area.  You have a fever.  You have chills.   This information is not intended to replace advice given to you by your health care provider. Make sure you discuss any questions you have with your health care provider.   Document Released: 03/19/2004 Document Revised: 03/02/2014 Document Reviewed: 09/09/2013 Elsevier Interactive Patient Education 2016 [employer]  Burn Care Your skin is a natural barrier to infection. It is the largest organ of your body. Burns damage this natural protection. To help prevent infection, it is very important to follow your caregiver's instructions in the care of your burn. Burns are classified as:  First degree. There is only redness of the skin (erythema). No scarring is expected.  Second degree. There is blistering of the skin. Scarring may occur with deeper burns.  Third degree. All layers of the skin are injured, and scarring is expected. HOME CARE INSTRUCTIONS   Wash your hands well before changing your bandage.  Change your bandage as often as directed by  your caregiver.  Remove the old bandage. If the bandage sticks, you may soak it off with cool, clean water.  Cleanse the burn thoroughly but gently with mild soap and water.  Pat the area dry with a clean, dry cloth.  Apply a thin layer of antibacterial cream to the burn.  Apply a clean bandage as instructed by your caregiver.  Keep the bandage as clean and dry as possible.  Elevate the affected area for the first 24 hours, then  as instructed by your caregiver.  Only take over-the-counter or prescription medicines for pain, discomfort, or fever as directed by your caregiver. SEEK IMMEDIATE MEDICAL CARE IF:   You develop excessive pain.  You develop redness, tenderness, swelling, or red streaks near the burn.  The burned area develops yellowish-white fluid (pus) or a bad smell.  You have a fever. MAKE SURE YOU:   Understand these instructions.  Will watch your condition.  Will get help right away if you are not doing well or get worse.   This information is not intended to replace advice given to you by your health care provider. Make sure you discuss any questions you have with your health care provider.   Document Released: 02/09/2005 Document Revised: 05/04/2011 Document Reviewed: 07/02/2010 Elsevier Interactive Patient Education [employer]

## 2015-09-02 ENCOUNTER — Emergency Department (HOSPITAL_COMMUNITY)
Admission: EM | Admit: 2015-09-02 | Discharge: 2015-09-03 | Disposition: A | Discharge status: Home / Self Care | Payer: Self-pay | Attending: Emergency Medicine | Admitting: Emergency Medicine

## 2015-09-02 ENCOUNTER — Emergency Department (HOSPITAL_COMMUNITY): Payer: Self-pay

## 2015-09-02 ENCOUNTER — Encounter (HOSPITAL_COMMUNITY): Payer: Self-pay | Admitting: Emergency Medicine

## 2015-09-02 DIAGNOSIS — Y999 Unspecified external cause status: Secondary | ICD-10-CM | POA: Insufficient documentation

## 2015-09-02 DIAGNOSIS — F17210 Nicotine dependence, cigarettes, uncomplicated: Secondary | ICD-10-CM | POA: Insufficient documentation

## 2015-09-02 DIAGNOSIS — R569 Unspecified convulsions: Secondary | ICD-10-CM

## 2015-09-02 DIAGNOSIS — Z791 Long term (current) use of non-steroidal anti-inflammatories (NSAID): Secondary | ICD-10-CM | POA: Insufficient documentation

## 2015-09-02 DIAGNOSIS — Y939 Activity, unspecified: Secondary | ICD-10-CM | POA: Insufficient documentation

## 2015-09-02 DIAGNOSIS — S46811A Strain of other muscles, fascia and tendons at shoulder and upper arm level, right arm, initial encounter: Secondary | ICD-10-CM

## 2015-09-02 DIAGNOSIS — Z79899 Other long term (current) drug therapy: Secondary | ICD-10-CM | POA: Insufficient documentation

## 2015-09-02 DIAGNOSIS — Y929 Unspecified place or not applicable: Secondary | ICD-10-CM | POA: Insufficient documentation

## 2015-09-02 DIAGNOSIS — F445 Conversion disorder with seizures or convulsions: Secondary | ICD-10-CM | POA: Insufficient documentation

## 2015-09-02 DIAGNOSIS — W07XXXA Fall from chair, initial encounter: Secondary | ICD-10-CM | POA: Insufficient documentation

## 2015-09-02 DIAGNOSIS — S46911A Strain of unspecified muscle, fascia and tendon at shoulder and upper arm level, right arm, initial encounter: Secondary | ICD-10-CM | POA: Insufficient documentation

## 2015-09-02 DIAGNOSIS — S0093XA Contusion of unspecified part of head, initial encounter: Secondary | ICD-10-CM | POA: Insufficient documentation

## 2015-09-02 LAB — BASIC METABOLIC PANEL
Anion gap: 4 — ABNORMAL LOW (ref 5–15)
BUN: 9 mg/dL (ref 6–20)
CALCIUM: 8 mg/dL — AB (ref 8.9–10.3)
CO2: 25 mmol/L (ref 22–32)
CREATININE: 0.49 mg/dL (ref 0.44–1.00)
Chloride: 109 mmol/L (ref 101–111)
GFR calc non Af Amer: >60 mL/min (ref >60)
Glucose, Bld: 91 mg/dL (ref 65–99)
Potassium: 3.6 mmol/L (ref 3.5–5.1)
Sodium: 138 mmol/L (ref 135–145)

## 2015-09-02 LAB — CBC
HEMATOCRIT: 36.7 % (ref 36.0–46.0)
Hemoglobin: 12.1 g/dL (ref 12.0–15.0)
MCH: 29.3 pg (ref 26.0–34.0)
MCHC: 33 g/dL (ref 30.0–36.0)
MCV: 88.9 fL (ref 78.0–100.0)
Platelets: 294 10*3/uL (ref 150–400)
RBC: 4.13 MIL/uL (ref 3.87–5.11)
RDW: 13.7 % (ref 11.5–15.5)
WBC: 8.9 10*3/uL (ref 4.0–10.5)

## 2015-09-02 LAB — URINALYSIS, ROUTINE W REFLEX MICROSCOPIC
BILIRUBIN URINE: NEGATIVE
GLUCOSE, UA: NEGATIVE mg/dL
HGB URINE DIPSTICK: NEGATIVE
Ketones, ur: NEGATIVE mg/dL
Leukocytes, UA: NEGATIVE
Nitrite: NEGATIVE
PH: 6.5 (ref 5.0–8.0)
Protein, ur: NEGATIVE mg/dL
SPECIFIC GRAVITY, URINE: 1.01 (ref 1.005–1.030)

## 2015-09-02 LAB — CBG MONITORING, ED: GLUCOSE-CAPILLARY: 98 mg/dL (ref 65–99)

## 2015-09-02 LAB — PREGNANCY, URINE: Preg Test, Ur: NEGATIVE

## 2015-09-02 MED ORDER — CYCLOBENZAPRINE HCL 10 MG PO TABS
10.0000 mg | ORAL_TABLET | Freq: Once | ORAL | Status: AC
  Administered 2015-09-02: 10 mg via ORAL
  Filled 2015-09-02: qty 1

## 2015-09-02 MED ORDER — KETOROLAC TROMETHAMINE 30 MG/ML IJ SOLN
30.0000 mg | Freq: Once | INTRAMUSCULAR | Status: AC
  Administered 2015-09-02: 30 mg via INTRAVENOUS
  Filled 2015-09-02: qty 1
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
## 2015-09-02 NOTE — ED Notes (Signed)
Pt had seizure per family around 2000 tonight. Pt c/o neck, shoulders, head, and back pain.

## 2015-09-03 MED ORDER — NAPROXEN 500 MG PO TABS: ORAL_TABLET | ORAL | Status: DC

## 2015-09-03 MED ORDER — CYCLOBENZAPRINE HCL 5 MG PO TABS: 5.0000 mg | ORAL_TABLET | Freq: Three times a day (TID) | ORAL | Status: DC | PRN

## 2015-09-03 NOTE — Discharge Instructions (Signed)
Use ice and heat on the sore muscles. Take the medications as prescribed. Recheck as needed. Return to the ED for any problems listed on the head injury sheet.   Cryotherapy Cryotherapy is when you put ice on your injury. Ice helps lessen pain and puffiness (swelling) after an injury. Ice works the best when you start using it in the first 24 to 48 hours after an injury. HOME CARE  Put a dry or damp towel between the ice pack and your skin.  You may press gently on the ice pack.  Leave the ice on for no more than 10 to 20 minutes at a time.  Check your skin after 5 minutes to make sure your skin is okay.  Rest at least 20 minutes between ice pack uses.  Stop using ice when your skin loses feeling (numbness).  Do not use ice on someone who cannot tell you when it hurts. This includes small children and people with memory problems (dementia). GET HELP RIGHT AWAY IF:  You have white spots on your skin.  Your skin turns blue or pale.  Your skin feels waxy or hard.  Your puffiness gets worse. MAKE SURE YOU:   Understand these instructions.  Will watch your condition.  Will get help right away if you are not doing well or get worse.   This information is not intended to replace advice given to you by your health care provider. Make sure you discuss any questions you have with your health care provider.   Document Released: 07/29/2007 Document Revised: 05/04/2011 Document Reviewed: 10/02/2010 Elsevier Interactive Patient Education 2016 [employer]  Head Injury, Adult You have a head injury. Headaches and throwing up (vomiting) are common after a head injury. It should be easy to wake up from sleeping. Sometimes you must stay in the hospital. Most problems happen within the first 24 hours. Side effects may occur up to 7-10 days after the injury.  WHAT ARE THE TYPES OF HEAD INJURIES? Head injuries can be as minor as a bump. Some head injuries can be more severe. More severe head  injuries include:  A jarring injury to the brain (concussion).  A bruise of the brain (contusion). This mean there is bleeding in the brain that can cause swelling.  A cracked skull (skull fracture).  Bleeding in the brain that collects, clots, and forms a bump (hematoma). WHEN SHOULD I GET HELP RIGHT AWAY?   You are confused or sleepy.  You cannot be woken up.  You feel sick to your stomach (nauseous) or keep throwing up (vomiting).  Your dizziness or unsteadiness is getting worse.  You have very bad, lasting headaches that are not helped by medicine. Take medicines only as told by your doctor.  You cannot use your arms or legs like normal.  You cannot walk.  You notice changes in the black spots in the center of the colored part of your eye (pupil).  You have clear or bloody fluid coming from your nose or ears.  You have trouble seeing. During the next 24 hours after the injury, you must stay with someone who can watch you. This person should get help right away (call 911 in the [place] if you start to shake and are not able to control it (have seizures), you pass out, or you are unable to wake up. HOW CAN I PREVENT A HEAD INJURY IN THE FUTURE?  Wear seat belts.  Wear a helmet while bike riding and playing sports like  football.  Stay away from dangerous activities around the house. WHEN CAN I RETURN TO NORMAL ACTIVITIES AND ATHLETICS? See your doctor before doing these activities. You should not do normal activities or play contact sports until 1 week after the following symptoms have stopped:  Headache that does not go away.  Dizziness.  Poor attention.  Confusion.  Memory problems.  Sickness to your stomach or throwing up.  Tiredness.  Fussiness.  Bothered by bright lights or loud noises.  Anxiousness or depression.  Restless sleep. MAKE SURE YOU:   Understand these instructions.  Will watch your condition.  Will get help right away if you are not  doing well or get worse.   This information is not intended to replace advice given to you by your health care provider. Make sure you discuss any questions you have with your health care provider.   Document Released: 01/23/2008 Document Revised: 03/02/2014 Document Reviewed: 10/17/2012 Elsevier Interactive Patient Education [employer]

## 2015-09-27 ENCOUNTER — Emergency Department (HOSPITAL_COMMUNITY)
Admission: EM | Admit: 2015-09-27 | Discharge: 2015-09-27 | Disposition: A | Discharge status: Home / Self Care | Payer: Medicaid Other | Attending: Emergency Medicine | Admitting: Emergency Medicine

## 2015-09-27 ENCOUNTER — Encounter (HOSPITAL_COMMUNITY): Payer: Self-pay

## 2015-09-27 DIAGNOSIS — Z79899 Other long term (current) drug therapy: Secondary | ICD-10-CM | POA: Insufficient documentation

## 2015-09-27 DIAGNOSIS — G40909 Epilepsy, unspecified, not intractable, without status epilepticus: Secondary | ICD-10-CM | POA: Insufficient documentation

## 2015-09-27 DIAGNOSIS — F17210 Nicotine dependence, cigarettes, uncomplicated: Secondary | ICD-10-CM | POA: Insufficient documentation

## 2015-09-27 LAB — URINALYSIS, ROUTINE W REFLEX MICROSCOPIC
Bilirubin Urine: NEGATIVE
Glucose, UA: NEGATIVE mg/dL
KETONES UR: NEGATIVE mg/dL
LEUKOCYTES UA: NEGATIVE
NITRITE: NEGATIVE
PROTEIN: NEGATIVE mg/dL
Specific Gravity, Urine: <1.005 — ABNORMAL LOW (ref 1.005–1.030)
pH: 6 (ref 5.0–8.0)

## 2015-09-27 LAB — I-STAT CHEM 8, ED
BUN: 5 mg/dL — ABNORMAL LOW (ref 6–20)
Calcium, Ion: 1.2 mmol/L (ref 1.13–1.30)
Chloride: 104 mmol/L (ref 101–111)
Creatinine, Ser: 0.7 mg/dL (ref 0.44–1.00)
GLUCOSE: 101 mg/dL — AB (ref 65–99)
HEMATOCRIT: 39 % (ref 36.0–46.0)
Hemoglobin: 13.3 g/dL (ref 12.0–15.0)
POTASSIUM: 3.6 mmol/L (ref 3.5–5.1)
SODIUM: 141 mmol/L (ref 135–145)
TCO2: 25 mmol/L (ref 0–100)

## 2015-09-27 LAB — URINE MICROSCOPIC-ADD ON: WBC UA: NONE SEEN WBC/hpf (ref 0–5)

## 2015-09-27 LAB — POC URINE PREG, ED: PREG TEST UR: NEGATIVE

## 2015-09-27 NOTE — ED Triage Notes (Signed)
Pt in by [facility] for seizures x 3 tonight.  Pt also fell during a seizure and injured her right shoulder.  Pt is awake, alert, oriented on arrival.

## 2015-09-27 NOTE — Discharge Instructions (Signed)

## 2015-09-27 NOTE — ED Provider Notes (Signed)
[facility]Y [facility]PT Provider Note   CSN: [id number] Arrival date & time: 09/27/15  2032  First MD Initiated Contact with Patient 09/27/2015 9:30 PM   By signing my name below, I, Jasmyn B. Alexander, attest that this documentation has been prepared under the direction and in the presence of Maia Plan, MD. Electronically Signed: Gillis Ends. Lyn Hollingshead, ED Scribe. 09/27/15. 10:00 PM.  History   Chief Complaint Chief Complaint  Patient presents with  . Seizures    HPI HPI Comments: Patricia Irwin is a 25 y.o. female brought in by ambulance, with PMHx of Pseudoseizures and PTSD who presents to the Emergency Department complaining of intermittent pseudoseizures which began today. 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 Pt's husband reports that she had multiple LOC episodes (8-10 times) after seizures and she fell to her knees. Pt has associated right shoulder pain and weakness. He notes that patient's face becomes blank, she has a "dreamy" voice, and decreased talking when she is about to have a seizure. Most of patient's symptoms have resolved while in [facility]. She notes that she recently has been under stress. She missed her menstrual period last month. She has taken 4 pregnancy tests with 2 negatives and 2 "faint positive" results. Her period recently started on 09/25/15, which is "less mucus-y." She also complains of having "clear liquid" secreting from breasts. No alleviating factors noted. Pt takes Keppra for seizures and compliant with all medications. Dr. Quintin Alto is Neurologist, next appt is 02/13/16. Denies any fever, nausea, or vomiting.   The history is provided by the patient and the spouse. No language interpreter was used.    Past Medical History:  Diagnosis Date  . Anxiety   . Chronic headache   . Chronic knee pain   . Hx of electroencephalogram 12/2013 ([facility])   normal, dx non-epileptic seizures  . Hypoglycemia   .  Noncompliance with medications   . Pseudoseizures   . PTSD (post-traumatic stress disorder)   . Seizures (HCC)     There are no active problems to display for this patient.   Past Surgical History:  Procedure Laterality Date  . WISDOM TOOTH EXTRACTION      OB History    Gravida Para Term Preterm AB Living   0             SAB TAB Ectopic Multiple Live Births                   Home Medications    Prior to Admission medications   Medication Sig Start Date End Date Taking? Authorizing Provider  clonazePAM (KLONOPIN) 1 MG tablet Take 1 mg by mouth 3 (three) times daily.   Yes Historical Provider, MD  escitalopram (LEXAPRO) 10 MG tablet Take 10 mg by mouth daily.   Yes Historical Provider, MD  levETIRAcetam (KEPPRA XR) 500 MG 24 hr tablet Take 2 tablets (1,000 mg total) by mouth daily. Patient taking differently: Take 2,000 mg by mouth daily.  04/07/13  Yes Tammy Triplett, PA-C    Family History Family History  Problem Relation Age of Onset  . Diabetes Father     Social History Social History  Substance Use Topics  . Smoking status: Current Every Day Smoker    Packs/day: 0.50    Types: Cigarettes  . Smokeless tobacco: Never Used  . Alcohol use No     Comment: occasional     Allergies   Dilantin [phenytoin]; Amoxicillin; Amoxicillin-pot  clavulanate; Bee venom; Celexa [citalopram hydrobromide]; Cinnamon; Influenza vaccines; Metronidazole; Penicillins; Tape; Latex; and Trintellix [vortioxetine]   Review of Systems Review of Systems  Constitutional: Negative for fever.  Gastrointestinal: Negative for nausea and vomiting.  Musculoskeletal: Positive for arthralgias.  Neurological: Positive for seizures, syncope and weakness.  All other systems reviewed and are negative.   Physical Exam Updated Vital Signs BP 107/74   Pulse (!) 59   Temp 98 F (36.7 C) (Oral)   Resp 18   Ht 5\' 3"  (1.6 m)   Wt 141 lb (64 kg)   LMP 09/25/2015   SpO2 98%   BMI 24.98 kg/m    Physical Exam  Constitutional: Alert and oriented. Well appearing and in no acute distress. Eyes: Conjunctivae are normal. PERRL. EOMI. Head: Atraumatic. Nose: No congestion/rhinnorhea. Mouth/Throat: Mucous membranes are moist.  Oropharynx non-erythematous. Neck: No stridor.  No meningeal signs.  Cardiovascular: Normal rate, regular rhythm. Good peripheral circulation. Grossly normal heart sounds.   Respiratory: Normal respiratory effort.  No retractions. Lungs CTAB. Gastrointestinal: Soft and nontender. No distention.  Musculoskeletal: No lower extremity tenderness nor edema. No gross deformities of extremities. Neurologic:  Normal speech and language. No gross focal neurologic deficits are appreciated.  Skin:  Skin is warm, dry and intact. No rash noted. Psychiatric: Mood and affect are normal. Speech and behavior are normal.  ED Treatments / Results  DIAGNOSTIC STUDIES: Oxygen Saturation is 97% on RA, normal by my interpretation.    COORDINATION OF CARE: 9:48 PM-Discussed treatment plan which includes Chem 8 with pt at bedside and pt agreed to plan.   Labs (all labs ordered are listed, but only abnormal results are displayed) Labs Reviewed  URINALYSIS, ROUTINE W REFLEX MICROSCOPIC (NOT AT [facility]) - Abnormal; Notable for the following:       Result Value   Specific Gravity, Urine <1.005 (*)    Hgb urine dipstick MODERATE (*)    All other components within normal limits  URINE MICROSCOPIC-ADD ON - Abnormal; Notable for the following:    Squamous Epithelial / LPF 0-5 (*)    Bacteria, UA RARE (*)    All other components within normal limits  I-STAT CHEM 8, ED - Abnormal; Notable for the following:    BUN 5 (*)    Glucose, Bld 101 (*)    All other components within normal limits  POC URINE PREG, ED   Procedures Procedures (including critical care time)  Initial Impression / Assessment and Plan / ED Course  I have reviewed the triage vital signs and the nursing  notes.  Pertinent labs & imaging results that were available during my care of the patient were reviewed by me and considered in my medical decision making (see chart for details).  Clinical Course   Patient has had multiple "psychogenic seizures" today along with "psychogenic syncope." Patient characterizes these episodes in this way and denies and epileptiform seizure activity with she has also had in the past. Currently on Keppra and is compliant with these medications. Well-appearing with normal neurological exam. Normal EKG and labs. No clear indication to suggest cardiogenic syncope base on history or exam. Plan for discharge with PCP and Neurology follow up.   At this time, I do not feel there is any life-threatening condition present. I have reviewed and discussed all results (EKG, imaging, lab, urine as appropriate), exam findings with patient. I have reviewed nursing notes and appropriate previous records.  I feel the patient is safe to be discharged home without further  emergent workup. Discussed usual and customary return precautions. Patient and family (if present) verbalize understanding and are comfortable with this plan.  Patient will follow-up with their primary care provider. If they do not have a primary care provider, information for follow-up has been provided to them. All questions have been answered.   Final Clinical Impressions(s) / ED Diagnoses   Final diagnoses:  Seizure disorder [facility])    New Prescriptions None  Performed the above documentation with the assistance of a scribe. I have reviewed the documentation for completeness and made changes as necessary.   Alona Bene, MD    Maia Plan, MD 09/28/15 Windy Fast

## 2015-11-07 ENCOUNTER — Emergency Department
Admission: EM | Admit: 2015-11-07 | Discharge: 2015-11-07 | Disposition: A | Discharge status: Home / Self Care | Payer: Medicaid Other | Attending: Emergency Medicine | Admitting: Emergency Medicine

## 2015-11-07 DIAGNOSIS — Z5321 Procedure and treatment not carried out due to patient leaving prior to being seen by health care provider: Secondary | ICD-10-CM | POA: Insufficient documentation

## 2015-11-07 DIAGNOSIS — Z79899 Other long term (current) drug therapy: Secondary | ICD-10-CM | POA: Insufficient documentation

## 2015-11-07 DIAGNOSIS — F17210 Nicotine dependence, cigarettes, uncomplicated: Secondary | ICD-10-CM | POA: Insufficient documentation

## 2015-11-07 DIAGNOSIS — G40909 Epilepsy, unspecified, not intractable, without status epilepticus: Secondary | ICD-10-CM | POA: Insufficient documentation

## 2015-11-07 NOTE — ED Triage Notes (Addendum)
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

## 2015-11-10 ENCOUNTER — Encounter (HOSPITAL_COMMUNITY): Payer: Self-pay | Admitting: *Deleted

## 2015-11-10 ENCOUNTER — Emergency Department (HOSPITAL_COMMUNITY)
Admission: EM | Admit: 2015-11-10 | Discharge: 2015-11-11 | Disposition: A | Discharge status: Home / Self Care | Payer: Medicaid Other | Attending: Emergency Medicine | Admitting: Emergency Medicine

## 2015-11-10 DIAGNOSIS — G40909 Epilepsy, unspecified, not intractable, without status epilepticus: Secondary | ICD-10-CM | POA: Insufficient documentation

## 2015-11-10 DIAGNOSIS — F17210 Nicotine dependence, cigarettes, uncomplicated: Secondary | ICD-10-CM | POA: Insufficient documentation

## 2015-11-10 DIAGNOSIS — F445 Conversion disorder with seizures or convulsions: Secondary | ICD-10-CM

## 2015-11-10 MED ORDER — DIPHENHYDRAMINE HCL 50 MG/ML IJ SOLN
25.0000 mg | Freq: Once | INTRAMUSCULAR | Status: AC
  Administered 2015-11-11: 25 mg via INTRAVENOUS
  Filled 2015-11-10: qty 1

## 2015-11-10 NOTE — ED Provider Notes (Signed)
[facility] Provider Note   CSN: [id number] Arrival date & time: 11/10/15  2342  By signing my name below, I, Alyssa Grove, attest that this documentation has been prepared under the direction and in the presence of Dione Booze, MD. Electronically Signed: Alyssa Grove, ED Scribe. 11/10/15. 12:01 AM.   History   Chief Complaint Chief Complaint  Patient presents with  . Seizures   The history is provided by the patient. No language interpreter was used.    LEVEL 5 CAVEAT: HPI and ROS limited due to altered mental status  HPI Comments: Patricia Irwin is a 25 y.o. female with PMHx of Hypoglycemia, PTSD, and Psychogenic Seizures who presents to the Emergency Department complaining of psychogenic seizures. Pt experiencing seizure upon arrival to ED and upon exam. Has hx of psychogenic seizures and comes to ED for ongoing problems with seizures.  Past Medical History:  Diagnosis Date  . Anxiety   . Chronic headache   . Chronic knee pain   . Hx of electroencephalogram 12/2013 ([facility])   normal, dx non-epileptic seizures  . Hypoglycemia   . Noncompliance with medications   . Pseudoseizures   . PTSD (post-traumatic stress disorder)   . Seizures (HCC)     There are no active problems to display for this patient.   Past Surgical History:  Procedure Laterality Date  . WISDOM TOOTH EXTRACTION      OB History    Gravida Para Term Preterm AB Living   0             SAB TAB Ectopic Multiple Live Births                   Home Medications    Prior to Admission medications   Medication Sig Start Date End Date Taking? Authorizing Provider  clonazePAM (KLONOPIN) 1 MG tablet Take 1 mg by mouth 3 (three) times daily.    Historical Provider, MD  escitalopram (LEXAPRO) 10 MG tablet Take 10 mg by mouth daily.    Historical Provider, MD  levETIRAcetam (KEPPRA XR) 500 MG 24 hr tablet Take 2 tablets (1,000 mg total) by mouth daily. Patient taking differently: Take 2,000 mg by mouth  daily.  04/07/13   Tammy Triplett, PA-C    Family History Family History  Problem Relation Age of Onset  . Diabetes Father     Social History Social History  Substance Use Topics  . Smoking status: Current Every Day Smoker    Packs/day: 0.50    Types: Cigarettes  . Smokeless tobacco: Never Used  . Alcohol use No     Comment: occasional     Allergies   Dilantin [phenytoin]; Amoxicillin; Amoxicillin-pot clavulanate; Bee venom; Celexa [citalopram hydrobromide]; Cinnamon; Influenza vaccines; Metronidazole; Penicillins; Tape; Latex; and Trintellix [vortioxetine]   Review of Systems Review of Systems  Constitutional: Negative for fever.  Neurological: Positive for seizures.  All other systems reviewed and are negative.    Physical Exam Updated Vital Signs Ht 5\' 3"  (1.6 m)   Wt 150 lb (68 kg)   BMI 26.57 kg/m   Physical Exam  Constitutional: She appears well-developed and well-nourished.  HENT:  Head: Normocephalic and atraumatic.  Eyes: EOM are normal. Pupils are equal, round, and reactive to light.  Neck: Normal range of motion. Neck supple. No JVD present.  Cardiovascular: Normal rate, regular rhythm and normal heart sounds.   No murmur heard. Pulmonary/Chest: Effort normal and breath sounds normal. She has no wheezes. She has no rales.  She exhibits no tenderness.  Abdominal: Soft. Bowel sounds are normal. She exhibits no distension and no mass. There is no tenderness.  Musculoskeletal: Normal range of motion. She exhibits no edema.  Lymphadenopathy:    She has no cervical adenopathy.  Neurological: No cranial nerve deficit. She exhibits normal muscle tone. Coordination normal.  Detached affect   Skin: Skin is warm and dry. No rash noted.  Nursing note and vitals reviewed.   ED Treatments / Results  DIAGNOSTIC STUDIES: Oxygen Saturation is 97% on RA, normal by my interpretation.    Labs (all labs ordered are listed, but only abnormal results are  displayed) Labs Reviewed  CBC WITH DIFFERENTIAL/PLATELET - Abnormal; Notable for the following:       Result Value   WBC 11.6 (*)    Platelets 440 (*)    All other components within normal limits  BASIC METABOLIC PANEL    Procedures Procedures (including critical care time)  Medications Ordered in ED Medications  ketorolac (TORADOL) 30 MG/ML injection 30 mg (not administered)  diphenhydrAMINE (BENADRYL) injection 25 mg (25 mg Intravenous Given 11/11/15 0009)     Initial Impression / Assessment and Plan / ED Course  I have reviewed the triage vital signs and the nursing notes.  Pertinent labs & imaging results that were available during my care of the patient were reviewed by me and considered in my medical decision making (see chart for details).  Clinical Course   Patient with history of psychogenic seizures presents with ongoing problems with these. On initial evaluation, patient had a rather bizarre affect and just reached out and stripped my hand. She then close her eyes with eyelid fluttering, arched her back, had a brief episode of generalized shaking with immediate waking up and no postictal state. She then looked at me and said that I was turning blue. Old records are reviewed, and she has multiple ED visits for psychogenic seizures. She is currently on levetiracetam. Given her history, we'll try to avoid benzodiazepine use. Screening labs are obtained and she's given a dose of diphenhydramine.   Following above-noted treatment, patient has woken up. She is back to her normal mentation. She is complaining of some generalized aching and is given a dose of ketorolac. She states that she has an appointment with her neurologist in December and she is to keep that appointment. No change in her current regimen is recommended.  Final Clinical Impressions(s) / ED Diagnoses   Final diagnoses:  Pseudoseizure [facility](HCC)   I personally performed the services described in this documentation,  which was scribed in my presence. The recorded information has been reviewed and is accurate.   New Prescriptions New Prescriptions   No medications on file     Dione Boozeavid Hagen Tidd, MD 11/11/15 [phone]

## 2015-11-10 NOTE — ED Triage Notes (Signed)
Pt actively seizing upon arrival to the ed.

## 2015-11-11 LAB — CBC WITH DIFFERENTIAL/PLATELET
BASOS ABS: 0.1 10*3/uL (ref 0.0–0.1)
BASOS PCT: 1 %
EOS PCT: 1 %
Eosinophils Absolute: 0.1 10*3/uL (ref 0.0–0.7)
HEMATOCRIT: 42.7 % (ref 36.0–46.0)
Hemoglobin: 13.9 g/dL (ref 12.0–15.0)
LYMPHS PCT: 35 %
Lymphs Abs: 4 10*3/uL (ref 0.7–4.0)
MCH: 28.3 pg (ref 26.0–34.0)
MCHC: 32.6 g/dL (ref 30.0–36.0)
MCV: 87 fL (ref 78.0–100.0)
MONO ABS: 0.8 10*3/uL (ref 0.1–1.0)
Monocytes Relative: 7 %
NEUTROS ABS: 6.7 10*3/uL (ref 1.7–7.7)
Neutrophils Relative %: 56 %
PLATELETS: 440 10*3/uL — AB (ref 150–400)
RBC: 4.91 MIL/uL (ref 3.87–5.11)
RDW: 13.9 % (ref 11.5–15.5)
WBC: 11.6 10*3/uL — AB (ref 4.0–10.5)

## 2015-11-11 LAB — BASIC METABOLIC PANEL
ANION GAP: 9 (ref 5–15)
BUN: 7 mg/dL (ref 6–20)
CALCIUM: 9.1 mg/dL (ref 8.9–10.3)
CO2: 24 mmol/L (ref 22–32)
Chloride: 103 mmol/L (ref 101–111)
Creatinine, Ser: 0.76 mg/dL (ref 0.44–1.00)
GFR calc Af Amer: >60 mL/min (ref >60)
GLUCOSE: 91 mg/dL (ref 65–99)
POTASSIUM: 3.5 mmol/L (ref 3.5–5.1)
SODIUM: 136 mmol/L (ref 135–145)

## 2015-11-11 MED ORDER — KETOROLAC TROMETHAMINE 30 MG/ML IJ SOLN
30.0000 mg | Freq: Once | INTRAMUSCULAR | Status: AC
  Administered 2015-11-11: 30 mg via INTRAVENOUS
  Filled 2015-11-11: qty 1

## 2015-11-11 NOTE — Discharge Instructions (Signed)
.   Follow up with your neurologist as scheduled.  

## 2015-12-26 ENCOUNTER — Emergency Department (HOSPITAL_COMMUNITY)
Admission: EM | Admit: 2015-12-26 | Discharge: 2015-12-27 | Disposition: A | Discharge status: Home / Self Care | Payer: Self-pay | Attending: Emergency Medicine | Admitting: Emergency Medicine

## 2015-12-26 ENCOUNTER — Encounter (HOSPITAL_COMMUNITY): Payer: Self-pay | Admitting: *Deleted

## 2015-12-26 ENCOUNTER — Emergency Department (HOSPITAL_COMMUNITY): Payer: Self-pay

## 2015-12-26 DIAGNOSIS — R569 Unspecified convulsions: Secondary | ICD-10-CM

## 2015-12-26 DIAGNOSIS — R05 Cough: Secondary | ICD-10-CM | POA: Insufficient documentation

## 2015-12-26 DIAGNOSIS — Z79899 Other long term (current) drug therapy: Secondary | ICD-10-CM | POA: Insufficient documentation

## 2015-12-26 DIAGNOSIS — F17210 Nicotine dependence, cigarettes, uncomplicated: Secondary | ICD-10-CM | POA: Insufficient documentation

## 2015-12-26 DIAGNOSIS — G40909 Epilepsy, unspecified, not intractable, without status epilepticus: Secondary | ICD-10-CM | POA: Insufficient documentation

## 2015-12-26 NOTE — ED Notes (Signed)
Dr Mesner at bedside,  

## 2015-12-26 NOTE — ED Provider Notes (Signed)
[facility]-EM[facility]Y DEPT Provider Note   CSN: [id number] Arrival date & time: 12/26/15  2257  By signing my name below, I, Linna Darner, attest that this documentation has been prepared under the direction and in the presence of physician practitioner, Marily Memos, MD. Electronically Signed: Linna Darner, Scribe. 12/26/2015. 11:09 PM.  History   Chief Complaint Chief Complaint  Patient presents with  . Seizures    The history is provided by the patient and the spouse. No language interpreter was used.     HPI Comments: Patricia Irwin is a 25 y.o. female with PMHx of epileptic and psychogenic seizures, anxiety, and PTSD who presents to the Emergency Department complaining of intermittent seizures beginning around 730 PM this evening. Husband reports that pt had a seizure at 730 PM and lost consciousness. He reports she became "somewhat lucid" and then had another seizure that lasted about a minute. He reports pt then had two more short seizures after this. Husband states that pt has not known who she was, where she was, or who her husband was at several points since her seizures. 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 Husband notes pt began coughing tonight. He reports that pt wanted to come to the ER tonight to make sure her blood pressure wasn't dangerously elevated since the seizures. He also notes that pt intentionally cut her bilateral wrists with a knife several days ago and has lacerations to both wrists. He states pt has not been acting normal for the last several days. He reports that pt has expressed an urge to cut herself and said that her "arms needed to be removed and replaced." He states she has a h/o self-injury. She uses Keppra for seizures. She also uses Lexapro and Klonopin for anxiety and PTSD. She denies headache, chest pain, back pain, leg pain, abdominal pain, dysuria, frequency, numbness, neuro deficits, or any other associated symptoms.  Past Medical  History:  Diagnosis Date  . Anxiety   . Chronic headache   . Chronic knee pain   . Hx of electroencephalogram 12/2013 ([facility])   normal, dx non-epileptic seizures  . Hypoglycemia   . Noncompliance with medications   . Pseudoseizures   . PTSD (post-traumatic stress disorder)   . Seizures (HCC)     There are no active problems to display for this patient.   Past Surgical History:  Procedure Laterality Date  . WISDOM TOOTH EXTRACTION      OB History    Gravida Para Term Preterm AB Living   0             SAB TAB Ectopic Multiple Live Births                   Home Medications    Prior to Admission medications   Medication Sig Start Date End Date Taking? Authorizing Provider  clonazePAM (KLONOPIN) 1 MG tablet Take 1 mg by mouth 3 (three) times daily.    Historical Provider, MD  escitalopram (LEXAPRO) 10 MG tablet Take 10 mg by mouth daily.    Historical Provider, MD  levETIRAcetam (KEPPRA XR) 500 MG 24 hr tablet Take 2 tablets (1,000 mg total) by mouth daily. Patient taking differently: Take 2,000 mg by mouth daily.  04/07/13   Tammy Triplett, PA-C    Family History Family History  Problem Relation Age of Onset  . Diabetes Father     Social History Social History  Substance Use Topics  . Smoking status: Current  Every Day Smoker    Packs/day: 0.50    Types: Cigarettes  . Smokeless tobacco: Never Used  . Alcohol use No     Comment: occasional     Allergies   Dilantin [phenytoin]; Amoxicillin; Amoxicillin-pot clavulanate; Bee venom; Celexa [citalopram hydrobromide]; Cinnamon; Influenza vaccines; Metronidazole; Penicillins; Tape; Latex; and Trintellix [vortioxetine]   Review of Systems Review of Systems  Respiratory: Positive for cough.   Cardiovascular: Negative for chest pain.  Gastrointestinal: Negative for abdominal pain.  Genitourinary: Negative for dysuria and frequency.  Musculoskeletal: Negative for back pain and myalgias.  Skin: Positive for wound  (self-inflicted lacerations to bilateral wrists).  Neurological: Positive for seizures and syncope. Negative for numbness and headaches.  Psychiatric/Behavioral: Positive for self-injury.  All other systems reviewed and are negative.   Physical Exam Updated Vital Signs BP 114/66   Pulse 67   Temp 98.6 F (37 C) (Oral)   Resp 18   Wt 160 lb (72.6 kg)   LMP 11/30/2015   SpO2 98%   BMI 28.34 kg/m   Physical Exam  Constitutional: She is oriented to person, place, and time. She appears well-developed and well-nourished. No distress.  HENT:  Head: Normocephalic and atraumatic.  Eyes: Conjunctivae and EOM are normal.  Neck: Neck supple. No tracheal deviation present.  Cardiovascular: Normal rate, regular rhythm, normal heart sounds and intact distal pulses.   Pulmonary/Chest: Effort normal and breath sounds normal. No respiratory distress.  Abdominal: Soft. Bowel sounds are normal. She exhibits no distension. There is no tenderness. There is no rebound and no guarding.  Musculoskeletal: Normal range of motion.  C, T, and L spines are normal. No deformities or tenderness to upper extremities, chest, pelvis, or lower extremities.  Neurological: She is alert and oriented to person, place, and time. She has normal reflexes. She displays normal reflexes. No cranial nerve deficit. She exhibits normal muscle tone. Coordination normal.  Upper and lower extremities motor and sensation intact.  Skin: Skin is warm and dry.  Two superficial lacerations to the lateral side of the right distal arm. Healing blister to right first metacarpal on lateral side.  Psychiatric: She has a normal mood and affect. Her behavior is normal.  Nursing note and vitals reviewed.   ED Treatments / Results  Labs (all labs ordered are listed, but only abnormal results are displayed) Labs Reviewed  CBC WITH DIFFERENTIAL/PLATELET - Abnormal; Notable for the following:       Result Value   WBC 10.7 (*)    All  other components within normal limits  URINALYSIS, ROUTINE W REFLEX MICROSCOPIC (NOT [facility]) - Abnormal; Notable for the following:    Hgb urine dipstick TRACE (*)    All other components within normal limits  URINE MICROSCOPIC-ADD ON - Abnormal; Notable for the following:    Squamous Epithelial / LPF 6-30 (*)    Bacteria, UA FEW (*)    All other components within normal limits  BASIC METABOLIC PANEL  PREGNANCY, URINE    EKG  EKG Interpretation  Date/Time:  Thursday December 26 2015 23:28:20 EDT Ventricular Rate:  72 PR Interval:    QRS Duration: 89 QT Interval:  387 QTC Calculation: 424 R Axis:   84 Text Interpretation:  Sinus rhythm Confirmed by Latavia Goga MD, Barbara CowerJASON [phone]([id number]) on 12/27/2015 12:19:24 AM       Radiology Dg Chest 2 View  Result Date: 12/27/2015 CLINICAL DATA:  Status post multiple seizures. Dry cough and shortness of breath, acute onset. Initial encounter. EXAM: CHEST  2 VIEW COMPARISON:  Chest radiograph performed 05/08/2015 FINDINGS: The lungs are well-aerated and clear. There is no evidence of focal opacification, pleural effusion or pneumothorax. The heart is normal in size; the mediastinal contour is within normal limits. No acute osseous abnormalities are seen. IMPRESSION: No acute cardiopulmonary process seen. Electronically Signed   By: Roanna RaiderJeffery  Chang M.D.   On: 12/27/2015 00:06    Procedures Procedures (including critical care time)  DIAGNOSTIC STUDIES: Oxygen Saturation is 98% on RA, normal by my interpretation.    COORDINATION OF CARE: 11:24 PM Discussed treatment plan with pt at bedside and pt agreed to plan.  Medications Ordered in ED Medications - No data to display   Initial Impression / Assessment and Plan / ED Course  I have reviewed the triage vital signs and the nursing notes.  Pertinent labs & imaging results that were available during my care of the patient were reviewed by me and considered in my medical decision making (see chart for  details).  Clinical Course    Psychogenic seizure and syncope tonight per her normal. Also with superficial lacs to right wrist not needing repair. She is a 'cutter' and does it to relieve the 'memory of bad things' and didn't have suicidal intent at that time (per her and husband) nor does she at this time. Does want to see her psychiatrist but no indication for emergent evaluation in ED, but will return for worsening/more persistent SI or plan of same.    I personally performed the services described in this documentation, which was scribed in my presence. The recorded information has been reviewed and is accurate.   Final Clinical Impressions(s) / ED Diagnoses   Final diagnoses:  Seizure-like activity [facility](HCC)    New Prescriptions Discharge Medication List as of 12/27/2015 12:50 AM       Marily MemosJason Cletis Muma, MD 12/27/15 [phone]

## 2015-12-26 NOTE — ED Triage Notes (Addendum)
Pt arrived to er with spouse for further evaluation of seizures, spouse reports that pt has been having "psychogenetic" seizures from her ptsd, spouse reports that the seizures started tonight at 7:30 and had continued, pt arrived to er, will answer some questions but is slow in her responses and is confused on other questions, no mouth trauma or incontinence noted, pt does have two "lacerations" noted to right wrist, admits to cutting herself due to having problems with her PTSD,

## 2015-12-27 LAB — CBC WITH DIFFERENTIAL/PLATELET
Basophils Absolute: 0.1 10*3/uL (ref 0.0–0.1)
Basophils Relative: 1 %
EOS ABS: 0.1 10*3/uL (ref 0.0–0.7)
Eosinophils Relative: 1 %
HEMATOCRIT: 42.9 % (ref 36.0–46.0)
HEMOGLOBIN: 14.4 g/dL (ref 12.0–15.0)
LYMPHS ABS: 3.8 10*3/uL (ref 0.7–4.0)
Lymphocytes Relative: 36 %
MCH: 28.9 pg (ref 26.0–34.0)
MCHC: 33.6 g/dL (ref 30.0–36.0)
MCV: 86.1 fL (ref 78.0–100.0)
MONOS PCT: 5 %
Monocytes Absolute: 0.5 10*3/uL (ref 0.1–1.0)
NEUTROS ABS: 6.2 10*3/uL (ref 1.7–7.7)
NEUTROS PCT: 57 %
Platelets: 318 10*3/uL (ref 150–400)
RBC: 4.98 MIL/uL (ref 3.87–5.11)
RDW: 13.9 % (ref 11.5–15.5)
WBC: 10.7 10*3/uL — ABNORMAL HIGH (ref 4.0–10.5)

## 2015-12-27 LAB — URINALYSIS, ROUTINE W REFLEX MICROSCOPIC
Bilirubin Urine: NEGATIVE
GLUCOSE, UA: NEGATIVE mg/dL
Ketones, ur: NEGATIVE mg/dL
Leukocytes, UA: NEGATIVE
Nitrite: NEGATIVE
Protein, ur: NEGATIVE mg/dL
SPECIFIC GRAVITY, URINE: 1.015 (ref 1.005–1.030)
pH: 7 (ref 5.0–8.0)

## 2015-12-27 LAB — BASIC METABOLIC PANEL
Anion gap: 6 (ref 5–15)
BUN: 7 mg/dL (ref 6–20)
CHLORIDE: 105 mmol/L (ref 101–111)
CO2: 24 mmol/L (ref 22–32)
CREATININE: 0.7 mg/dL (ref 0.44–1.00)
Calcium: 9 mg/dL (ref 8.9–10.3)
GFR calc Af Amer: >60 mL/min (ref >60)
GFR calc non Af Amer: >60 mL/min (ref >60)
Glucose, Bld: 96 mg/dL (ref 65–99)
Potassium: 3.9 mmol/L (ref 3.5–5.1)
Sodium: 135 mmol/L (ref 135–145)

## 2015-12-27 LAB — URINE MICROSCOPIC-ADD ON

## 2015-12-27 LAB — PREGNANCY, URINE: Preg Test, Ur: NEGATIVE

## 2015-12-27 NOTE — ED Notes (Signed)
ED Provider at bedside. 

## 2015-12-27 NOTE — ED Notes (Signed)
Pt returned from xray,  

## 2015-12-27 NOTE — ED Notes (Signed)
Pt and family updated on plan of care,  

## 2016-02-29 ENCOUNTER — Encounter (HOSPITAL_COMMUNITY): Payer: Self-pay

## 2016-02-29 ENCOUNTER — Emergency Department (HOSPITAL_COMMUNITY)
Admission: EM | Admit: 2016-02-29 | Discharge: 2016-02-29 | Disposition: A | Discharge status: Home / Self Care | Payer: Medicaid Other | Attending: Emergency Medicine | Admitting: Emergency Medicine

## 2016-02-29 DIAGNOSIS — F17210 Nicotine dependence, cigarettes, uncomplicated: Secondary | ICD-10-CM | POA: Insufficient documentation

## 2016-02-29 DIAGNOSIS — G40909 Epilepsy, unspecified, not intractable, without status epilepticus: Secondary | ICD-10-CM | POA: Insufficient documentation

## 2016-02-29 DIAGNOSIS — R569 Unspecified convulsions: Secondary | ICD-10-CM

## 2016-02-29 DIAGNOSIS — Z79899 Other long term (current) drug therapy: Secondary | ICD-10-CM | POA: Insufficient documentation

## 2016-02-29 LAB — CBC WITH DIFFERENTIAL/PLATELET
BASOS ABS: 0.1 10*3/uL (ref 0.0–0.1)
BASOS PCT: 1 %
EOS ABS: 0.1 10*3/uL (ref 0.0–0.7)
Eosinophils Relative: 0 %
HEMATOCRIT: 44 % (ref 36.0–46.0)
HEMOGLOBIN: 14.6 g/dL (ref 12.0–15.0)
Lymphocytes Relative: 35 %
Lymphs Abs: 4 10*3/uL (ref 0.7–4.0)
MCH: 28.9 pg (ref 26.0–34.0)
MCHC: 33.2 g/dL (ref 30.0–36.0)
MCV: 87 fL (ref 78.0–100.0)
MONOS PCT: 6 %
Monocytes Absolute: 0.7 10*3/uL (ref 0.1–1.0)
NEUTROS ABS: 6.7 10*3/uL (ref 1.7–7.7)
NEUTROS PCT: 58 %
Platelets: 343 10*3/uL (ref 150–400)
RBC: 5.06 MIL/uL (ref 3.87–5.11)
RDW: 14.5 % (ref 11.5–15.5)
WBC: 11.5 10*3/uL — AB (ref 4.0–10.5)

## 2016-02-29 LAB — BASIC METABOLIC PANEL
ANION GAP: 8 (ref 5–15)
BUN: 11 mg/dL (ref 6–20)
CHLORIDE: 104 mmol/L (ref 101–111)
CO2: 25 mmol/L (ref 22–32)
CREATININE: 0.66 mg/dL (ref 0.44–1.00)
Calcium: 9.3 mg/dL (ref 8.9–10.3)
GFR calc non Af Amer: >60 mL/min (ref >60)
Glucose, Bld: 98 mg/dL (ref 65–99)
Potassium: 3.9 mmol/L (ref 3.5–5.1)
SODIUM: 137 mmol/L (ref 135–145)

## 2016-02-29 LAB — URINALYSIS, ROUTINE W REFLEX MICROSCOPIC
BILIRUBIN URINE: NEGATIVE
Bacteria, UA: NONE SEEN
GLUCOSE, UA: NEGATIVE mg/dL
Ketones, ur: NEGATIVE mg/dL
Leukocytes, UA: NEGATIVE
NITRITE: NEGATIVE
PROTEIN: NEGATIVE mg/dL
SPECIFIC GRAVITY, URINE: 1.015 (ref 1.005–1.030)
pH: 7 (ref 5.0–8.0)

## 2016-02-29 LAB — HCG, QUANTITATIVE, PREGNANCY

## 2016-02-29 LAB — RAPID URINE DRUG SCREEN, HOSP PERFORMED
AMPHETAMINES: NOT DETECTED
Barbiturates: NOT DETECTED
Benzodiazepines: NOT DETECTED
Cocaine: NOT DETECTED
Opiates: NOT DETECTED
TETRAHYDROCANNABINOL: NOT DETECTED

## 2016-02-29 NOTE — ED Provider Notes (Signed)
[facility] DE[facility] Provider Note   CSN: [id number] Arrival date & time: 02/29/16  1911     History   Chief Complaint Chief Complaint  Patient presents with  . Seizures    HPI Patricia Irwin is a 26 y.o. female.  HPI She will history of psychogenic seizures and questionable epileptic [facility] with several seizures this evening. Was witnessed by her husband. Unknown length of time. Unknown post ictal phase. Denies intraoral trauma or incontinence. States she is feeling better at this time. Denies any neck pain, focal weakness or numbness. No visual changes. No known head trauma. She states she's been compliant with her medications. Past Medical History:  Diagnosis Date  . Anxiety   . Chronic headache   . Chronic knee pain   . Hx of electroencephalogram 12/2013 ([facility])   normal, dx non-epileptic seizures  . Hypoglycemia   . Noncompliance with medications   . Pseudoseizures   . PTSD (post-traumatic stress disorder)   . Seizures (HCC)     There are no active problems to display for this patient.   Past Surgical History:  Procedure Laterality Date  . WISDOM TOOTH EXTRACTION      OB History    Gravida Para Term Preterm AB Living   0             SAB TAB Ectopic Multiple Live Births                   Home Medications    Prior to Admission medications   Medication Sig Start Date End Date Taking? Authorizing Provider  clonazePAM (KLONOPIN) 1 MG tablet Take 1 mg by mouth 3 (three) times daily.   Yes Historical Provider, MD  escitalopram (LEXAPRO) 10 MG tablet Take 10 mg by mouth daily.   Yes Historical Provider, MD  levETIRAcetam (KEPPRA XR) 500 MG 24 hr tablet Take 2 tablets (1,000 mg total) by mouth daily. Patient taking differently: Take 2,000 mg by mouth daily.  04/07/13  Yes Tammy Triplett, PA-C    Family History Family History  Problem Relation Age of Onset  . Diabetes Father     Social History Social History    Substance Use Topics  . Smoking status: Current Every Day Smoker    Packs/day: 0.50    Types: Cigarettes  . Smokeless tobacco: Never Used  . Alcohol use No     Comment: occasional     Allergies   Dilantin [phenytoin]; Amoxicillin; Amoxicillin-pot clavulanate; Bee venom; Celexa [citalopram hydrobromide]; Cinnamon; Influenza vaccines; Metronidazole; Penicillins; Tape; Latex; and Trintellix [vortioxetine]   Review of Systems Review of Systems  Constitutional: Negative for chills and fever.  HENT: Negative for facial swelling.   Eyes: Negative for visual disturbance.  Respiratory: Negative for shortness of breath.   Cardiovascular: Negative for chest pain.  Gastrointestinal: Negative for abdominal pain, diarrhea, nausea and vomiting.  Genitourinary: Negative for dysuria, flank pain, pelvic pain, vaginal bleeding and vaginal discharge.  Musculoskeletal: Negative for back pain, myalgias, neck pain and neck stiffness.  Skin: Negative for rash and wound.  Neurological: Negative for dizziness, weakness, light-headedness, numbness and headaches.  All other systems reviewed and are negative.    Physical Exam Updated Vital Signs BP 112/86   Pulse 71   Temp 97.9 F (36.6 C) (Oral)   Resp 18   Ht 5\' 3"  (1.6 m)   Wt 169 lb (76.7 kg)   LMP 01/09/2016   SpO2 99%   BMI 29.94  kg/m   Physical Exam  Constitutional: She is oriented to person, place, and time. She appears well-developed and well-nourished. No distress.  HENT:  Head: Normocephalic and atraumatic.  Mouth/Throat: Oropharynx is clear and moist.  No scalp trauma. No intraoral trauma.  Eyes: EOM are normal. Pupils are equal, round, and reactive to light.  Neck: Normal range of motion. Neck supple.  No posterior midline cervical tenderness to palpation. No meningismus.  Cardiovascular: Normal rate and regular rhythm.  Exam reveals no gallop and no friction rub.   No murmur heard. Pulmonary/Chest: Effort normal and breath  sounds normal. No respiratory distress. She has no wheezes. She has no rales. She exhibits no tenderness.  Abdominal: Soft. Bowel sounds are normal. There is no tenderness. There is no rebound and no guarding.  Musculoskeletal: Normal range of motion. She exhibits no edema or tenderness.  No midline thoracic or lumbar tenderness. No lower sugary swelling, asymmetry or tenderness. Distal pulses intact.  Neurological: She is alert and oriented to person, place, and time.  Patient is alert and oriented x3 with clear, goal oriented speech. Patient has 5/5 motor in all extremities. Sensation is intact to light touch. Bilateral finger-to-nose is normal with no signs of dysmetria. Patient has a normal gait and walks without assistance.  Skin: Skin is warm and dry. No rash noted. No erythema.  Psychiatric: She has a normal mood and affect. Her behavior is normal.  Nursing note and vitals reviewed.    ED Treatments / Results  Labs (all labs ordered are listed, but only abnormal results are displayed) Labs Reviewed  CBC WITH DIFFERENTIAL/PLATELET - Abnormal; Notable for the following:       Result Value   WBC 11.5 (*)    All other components within normal limits  URINALYSIS, ROUTINE W REFLEX MICROSCOPIC - Abnormal; Notable for the following:    Hgb urine dipstick MODERATE (*)    All other components within normal limits  BASIC METABOLIC PANEL  HCG, QUANTITATIVE, PREGNANCY  RAPID URINE DRUG SCREEN, HOSP PERFORMED    EKG  EKG Interpretation None       Radiology No results found.  Procedures Procedures (including critical care time)  Medications Ordered in ED Medications - No data to display   Initial Impression / Assessment and Plan / ED Course  I have reviewed the triage vital signs and the nursing notes.  Pertinent labs & imaging results that were available during my care of the patient were reviewed by me and considered in my medical decision making (see chart for  details).  Clinical Course     Patient is well-appearing. Normal neurologic exam. Do not believe that further workup is necessary. Patient understands the need to follow-up with her neurologist. Return precautions given.  Final Clinical Impressions(s) / ED Diagnoses   Final diagnoses:  Seizure-like activity [facility])    New Prescriptions New Prescriptions   No medications on file     Loren Racer, MD 02/29/16 2059

## 2016-02-29 NOTE — ED Triage Notes (Signed)
Pt arrives by ems after having 3 seizures at home, none witnessed by ems.   Pt is awake and alert on arrival

## 2016-02-29 NOTE — ED Notes (Signed)
[facility] here[facility]  Catheter intact, site WNL and bandaid applied to site.

## 2016-02-29 NOTE — ED Notes (Signed)
Pt states that she lives with her husband and husband's brother

## 2016-02-29 NOTE — ED Notes (Signed)
Pt also states that she has had two positive pregnancy tests earlier but repeated today and resulted negative per pt

## 2016-02-29 NOTE — ED Notes (Signed)
Pt states that she has been taking seizure medication as prescribed

## 2016-05-19 ENCOUNTER — Emergency Department (HOSPITAL_COMMUNITY)
Admission: EM | Admit: 2016-05-19 | Discharge: 2016-05-19 | Disposition: A | Discharge status: Home / Self Care | Payer: Self-pay | Attending: Emergency Medicine | Admitting: Emergency Medicine

## 2016-05-19 ENCOUNTER — Encounter (HOSPITAL_COMMUNITY): Payer: Self-pay | Admitting: *Deleted

## 2016-05-19 ENCOUNTER — Emergency Department (HOSPITAL_COMMUNITY): Payer: Self-pay

## 2016-05-19 DIAGNOSIS — K5900 Constipation, unspecified: Secondary | ICD-10-CM | POA: Insufficient documentation

## 2016-05-19 DIAGNOSIS — R1031 Right lower quadrant pain: Secondary | ICD-10-CM

## 2016-05-19 DIAGNOSIS — N83201 Unspecified ovarian cyst, right side: Secondary | ICD-10-CM | POA: Insufficient documentation

## 2016-05-19 DIAGNOSIS — F17210 Nicotine dependence, cigarettes, uncomplicated: Secondary | ICD-10-CM | POA: Insufficient documentation

## 2016-05-19 LAB — COMPREHENSIVE METABOLIC PANEL
ALBUMIN: 4.1 g/dL (ref 3.5–5.0)
ALT: 18 U/L (ref 14–54)
ANION GAP: 6 (ref 5–15)
AST: 21 U/L (ref 15–41)
Alkaline Phosphatase: 84 U/L (ref 38–126)
BUN: 7 mg/dL (ref 6–20)
CALCIUM: 8.9 mg/dL (ref 8.9–10.3)
CHLORIDE: 106 mmol/L (ref 101–111)
CO2: 25 mmol/L (ref 22–32)
Creatinine, Ser: 0.64 mg/dL (ref 0.44–1.00)
GFR calc non Af Amer: >60 mL/min (ref >60)
GLUCOSE: 92 mg/dL (ref 65–99)
POTASSIUM: 3.5 mmol/L (ref 3.5–5.1)
SODIUM: 137 mmol/L (ref 135–145)
Total Bilirubin: 0.3 mg/dL (ref 0.3–1.2)
Total Protein: 7.6 g/dL (ref 6.5–8.1)

## 2016-05-19 LAB — CBC WITH DIFFERENTIAL/PLATELET
Basophils Absolute: 0.1 10*3/uL (ref 0.0–0.1)
Basophils Relative: 1 %
EOS ABS: 0.1 10*3/uL (ref 0.0–0.7)
Eosinophils Relative: 1 %
HEMATOCRIT: 43.1 % (ref 36.0–46.0)
HEMOGLOBIN: 14.6 g/dL (ref 12.0–15.0)
LYMPHS ABS: 4.1 10*3/uL — AB (ref 0.7–4.0)
LYMPHS PCT: 49 %
MCH: 28.9 pg (ref 26.0–34.0)
MCHC: 33.9 g/dL (ref 30.0–36.0)
MCV: 85.2 fL (ref 78.0–100.0)
Monocytes Absolute: 0.6 10*3/uL (ref 0.1–1.0)
Monocytes Relative: 8 %
NEUTROS ABS: 3.4 10*3/uL (ref 1.7–7.7)
NEUTROS PCT: 41 %
Platelets: 348 10*3/uL (ref 150–400)
RBC: 5.06 MIL/uL (ref 3.87–5.11)
RDW: 13.8 % (ref 11.5–15.5)
WBC: 8.3 10*3/uL (ref 4.0–10.5)

## 2016-05-19 LAB — URINALYSIS, ROUTINE W REFLEX MICROSCOPIC
BACTERIA UA: NONE SEEN
BILIRUBIN URINE: NEGATIVE
Glucose, UA: NEGATIVE mg/dL
Ketones, ur: NEGATIVE mg/dL
Leukocytes, UA: NEGATIVE
Nitrite: NEGATIVE
PROTEIN: NEGATIVE mg/dL
Specific Gravity, Urine: 1.008 (ref 1.005–1.030)
pH: 7 (ref 5.0–8.0)

## 2016-05-19 LAB — POC URINE PREG, ED: PREG TEST UR: NEGATIVE

## 2016-05-19 LAB — LIPASE, BLOOD: Lipase: 20 U/L (ref 11–51)

## 2016-05-19 MED ORDER — IOPAMIDOL (ISOVUE-300) INJECTION 61%
100.0000 mL | Freq: Once | INTRAVENOUS | Status: AC | PRN
  Administered 2016-05-19: 100 mL via INTRAVENOUS

## 2016-05-19 MED ORDER — MAGNESIUM HYDROXIDE 400 MG/5ML PO SUSP
30.0000 mL | Freq: Once | ORAL | Status: AC
  Administered 2016-05-19: 30 mL via ORAL
  Filled 2016-05-19: qty 30

## 2016-05-19 MED ORDER — PROCHLORPERAZINE EDISYLATE 5 MG/ML IJ SOLN
5.0000 mg | Freq: Once | INTRAMUSCULAR | Status: AC
  Administered 2016-05-19: 5 mg via INTRAVENOUS
  Filled 2016-05-19: qty 2

## 2016-05-19 MED ORDER — POLYETHYLENE GLYCOL 3350 17 G PO PACK: 17.0000 g | PACK | Freq: Every day | ORAL | 0 refills | Status: DC

## 2016-05-19 MED ORDER — MORPHINE SULFATE (PF) 4 MG/ML IV SOLN
4.0000 mg | Freq: Once | INTRAVENOUS | Status: AC
  Administered 2016-05-19: 4 mg via INTRAVENOUS
  Filled 2016-05-19: qty 1

## 2016-05-19 MED ORDER — IBUPROFEN 600 MG PO TABS: 600.0000 mg | ORAL_TABLET | Freq: Four times a day (QID) | ORAL | 0 refills | Status: DC | PRN

## 2016-05-19 MED ORDER — IOPAMIDOL (ISOVUE-300) INJECTION 61%
INTRAVENOUS | Status: AC
  Filled 2016-05-19: qty 30

## 2016-05-19 MED ORDER — TRAMADOL HCL 50 MG PO TABS: ORAL_TABLET | ORAL | 0 refills | Status: DC

## 2016-05-19 NOTE — Discharge Instructions (Signed)
Your vital signs within normal limits. Your oxygen level is 98% on room air. Your CT scan suggest an ovarian cyst and constipation. Please use 600 mg of ibuprofen with breakfast, lunch, dinner, and at bedtime. Please use MiraLAX daily followed by warm beverage. Use Ultram every 6 hours if needed for more severe pain. It is very important that you see the GYN specialist listed above, or your GYN specialist concerning your ovarian cyst. Please increase water, juices, Gatorade etc., etc. to ensure good hydration, and to help with the constipation problem.

## 2016-05-19 NOTE — ED Triage Notes (Signed)
Pt reports sharp, rlq abdominal pain that started yesterday. Pt also reports nausea and dizziness.

## 2016-05-19 NOTE — ED Provider Notes (Signed)
[facility] DE[facility] Provider Note   CSN: [id number] Arrival date & time: 05/19/16  2020     History   Chief Complaint Chief Complaint  Patient presents with  . Abdominal Pain    HPI Patricia Irwin is a 26 y.o. female.  Patient is a 26 year old female who presents to the emergency department with a complaint of right lower quadrant abdomen pain.  The patient stated this problem started on yesterday March 26. It is been accompanied by nausea. The patient also states she's had some episodes of dizziness. The pain got worse tonight so she came into the emergency department. She states that at times she has pseudoseizures. She had a pseudoseizure she thinks tonight and the pain got even worse. She describes the pain mostly as a stab buttocks, sometimes it feels as though it's an ache. It feels better when she is still, it is worse when she moves. It is also of note that the patient is not had a bowel movement the past 2 weeks. She states she's had some increase in urination, but no discomfort with urination. Her last menstrual cycle was in November. She states that she has some irregularity of her menses from time to time. She used to be on birth control, but now because of the irregularity she is not on birth control. She has not had any injury or trauma to the right abdomen or pelvis.   The history is provided by the patient.    Past Medical History:  Diagnosis Date  . Anxiety   . Chronic headache   . Chronic knee pain   . Hx of electroencephalogram 12/2013 ([facility])   normal, dx non-epileptic seizures  . Hypoglycemia   . Noncompliance with medications   . Pseudoseizures   . PTSD (post-traumatic stress disorder)   . Seizures (HCC)     There are no active problems to display for this patient.   Past Surgical History:  Procedure Laterality Date  . WISDOM TOOTH EXTRACTION      OB History    Gravida Para Term Preterm AB Living   0             SAB TAB Ectopic Multiple Live  Births                   Home Medications    Prior to Admission medications   Medication Sig Start Date End Date Taking? Authorizing Provider  clonazePAM (KLONOPIN) 1 MG tablet Take 1 mg by mouth 3 (three) times daily.    Historical Provider, MD  escitalopram (LEXAPRO) 10 MG tablet Take 10 mg by mouth daily.    Historical Provider, MD  levETIRAcetam (KEPPRA XR) 500 MG 24 hr tablet Take 2 tablets (1,000 mg total) by mouth daily. Patient taking differently: Take 2,000 mg by mouth daily.  04/07/13   Tammy Triplett, PA-C    Family History Family History  Problem Relation Age of Onset  . Diabetes Father     Social History Social History  Substance Use Topics  . Smoking status: Current Every Day Smoker    Packs/day: 0.50    Types: Cigarettes  . Smokeless tobacco: Never Used  . Alcohol use No     Comment: occasional     Allergies   Dilantin [phenytoin]; Amoxicillin; Amoxicillin-pot clavulanate; Bee venom; Celexa [citalopram hydrobromide]; Cinnamon; Influenza vaccines; Metronidazole; Penicillins; Tape; Latex; and Trintellix [vortioxetine]   Review of Systems Review of Systems  Constitutional: Negative for appetite change and fever.  Respiratory: Negative  for cough, shortness of breath, wheezing and stridor.   Gastrointestinal: Positive for abdominal pain and constipation. Negative for vomiting.  Genitourinary: Positive for frequency. Negative for dysuria, vaginal bleeding, vaginal discharge and vaginal pain.  Neurological: Positive for seizures.  All other systems reviewed and are negative.    Physical Exam Updated Vital Signs BP (!) 120/93   Pulse 86   Temp 98.3 F (36.8 C) (Oral)   Resp 18   Ht 5\' 3"  (1.6 m)   Wt 81.6 kg   LMP 01/20/2016   SpO2 100%   BMI 31.89 kg/m   Physical Exam  Constitutional: She is oriented to person, place, and time. She appears well-developed and well-nourished.  Non-toxic appearance.  HENT:  Head: Normocephalic.  Right Ear:  Tympanic membrane and external ear normal.  Left Ear: Tympanic membrane and external ear normal.  Eyes: EOM and lids are normal. Pupils are equal, round, and reactive to light.  Neck: Normal range of motion. Neck supple. Carotid bruit is not present.  Cardiovascular: Normal rate, regular rhythm, normal heart sounds, intact distal pulses and normal pulses.   Pulmonary/Chest: Breath sounds normal. No respiratory distress.  Abdominal: Soft. Bowel sounds are normal. There is no splenomegaly or hepatomegaly. There is tenderness in the right lower quadrant and suprapubic area. There is no rigidity, no guarding and no CVA tenderness.  Musculoskeletal: Normal range of motion.  Lymphadenopathy:       Head (right side): No submandibular adenopathy present.       Head (left side): No submandibular adenopathy present.    She has no cervical adenopathy.  Neurological: She is alert and oriented to person, place, and time. She has normal strength. No cranial nerve deficit or sensory deficit.  Skin: Skin is warm and dry.  Psychiatric: She has a normal mood and affect. Her speech is normal.  Nursing note and vitals reviewed.    ED Treatments / Results  Labs (all labs ordered are listed, but only abnormal results are displayed) Labs Reviewed  COMPREHENSIVE METABOLIC PANEL  LIPASE, BLOOD  CBC WITH DIFFERENTIAL/PLATELET  URINALYSIS, ROUTINE W REFLEX MICROSCOPIC  POC URINE PREG, ED    EKG  EKG Interpretation None       Radiology No results found.  Procedures Procedures (including critical care time)  Medications Ordered in ED Medications - No data to display   Initial Impression / Assessment and Plan / ED Course  I have reviewed the triage vital signs and the nursing notes.  Pertinent labs & imaging results that were available during my care of the patient were reviewed by me and considered in my medical decision making (see chart for details).     *I have reviewed nursing notes,  vital signs, and all appropriate lab and imaging results for this patient.**  Final Clinical Impressions(s) / ED Diagnoses MDM Vital signs within normal limits. Pulse oximetry is 98% on room air. Within normal limits by my interpretation.  IV started. Patient treated with IV pain medication.  Urine pregnancy test is negative. Urinalysis is nonacute. Competence of metabolic panel is negative. Lipase is normal at 20. Complete blood count is within normal limits. CT scan of the abdomen shows a normal appendix. There is a right ovarian cyst that measures 2.3 cm. There is also diffuse stool burden throughout the colon.  I discussed these findings with the patient in terms which he understands. The patient will be started on MiraLAX daily. A prescription for ibuprofen 600 mg is to be use with  breakfast, lunch, dinner, and at bedtime. Pt will use Ultram for more severe pain. Patient is referred to GYN for additional evaluation concerning the a very and cyst. Patient is in agreement with this plan.    Final diagnoses:  Right ovarian cyst  Constipation, unspecified constipation type    New Prescriptions New Prescriptions   No medications on file     Ivery Quale, Cordelia Poche 05/19/16 2339    Benjiman Core, MD 05/19/16 [phone]

## 2016-06-02 ENCOUNTER — Ambulatory Visit (INDEPENDENT_AMBULATORY_CARE_PROVIDER_SITE_OTHER): Payer: Self-pay | Admitting: Adult Health

## 2016-06-02 ENCOUNTER — Encounter: Payer: Self-pay | Admitting: Adult Health

## 2016-06-02 VITALS — BP 108/60 | HR 60 | Ht 63.0 in | Wt 174.0 lb

## 2016-06-02 DIAGNOSIS — Z3202 Encounter for pregnancy test, result negative: Secondary | ICD-10-CM

## 2016-06-02 DIAGNOSIS — Z319 Encounter for procreative management, unspecified: Secondary | ICD-10-CM

## 2016-06-02 DIAGNOSIS — N926 Irregular menstruation, unspecified: Secondary | ICD-10-CM

## 2016-06-02 DIAGNOSIS — Z8742 Personal history of other diseases of the female genital tract: Secondary | ICD-10-CM | POA: Insufficient documentation

## 2016-06-02 LAB — POCT URINE PREGNANCY: PREG TEST UR: NEGATIVE

## 2016-06-02 MED ORDER — MEDROXYPROGESTERONE ACETATE 10 MG PO TABS: 10.0000 mg | ORAL_TABLET | Freq: Every day | ORAL | 0 refills | Status: DC

## 2016-06-02 NOTE — Patient Instructions (Signed)
Preparing for Pregnancy If you are considering becoming pregnant, make an appointment to see your regular health care provider to learn how to prepare for a safe and healthy pregnancy (preconception care). During a preconception care visit, your health care provider will:  Do a complete physical exam, including a Pap test.  Take a complete medical history.  Give you information, answer your questions, and help you resolve problems.  Preconception checklist Medical history  Tell your health care provider about any current or past medical conditions. Your pregnancy or your ability to become pregnant may be affected by chronic conditions, such as diabetes, chronic hypertension, and thyroid problems.  Include your family's medical history as well as your partner's medical history.  Tell your health care provider about any history of STIs (sexually transmitted infections).These can affect your pregnancy. In some cases, they can be passed to your baby. Discuss any concerns that you have about STIs.  If indicated, discuss the benefits of genetic testing. This testing will show whether there are any genetic conditions that may be passed from you or your partner to your baby.  Tell your health care provider about: ? Any problems you have had with conception or pregnancy. ? Any medicines you take. These include vitamins, herbal supplements, and over-the-counter medicines. ? Your history of immunizations. Discuss any vaccinations that you may need.  Diet  Ask your health care provider what to include in a healthy diet that has a balance of nutrients. This is especially important when you are pregnant or preparing to become pregnant.  Ask your health care provider to help you reach a healthy weight before pregnancy. ? If you are overweight, you may be at higher risk for certain complications, such as high blood pressure, diabetes, and preterm birth. ? If you are underweight, you are more likely  to have a baby who has a low birth weight.  Lifestyle, work, and home  Let your health care provider know: ? About any lifestyle habits that you have, such as alcohol use, drug use, or smoking. ? About recreational activities that may put you at risk during pregnancy, such as downhill skiing and certain exercise programs. ? Tell your health care provider about any international travel, especially any travel to places with an active Zika virus outbreak. ? About harmful substances that you may be exposed to at work or at home. These include chemicals, pesticides, radiation, or even litter boxes. ? If you do not feel safe at home.  Mental health  Tell your health care provider about: ? Any history of mental health conditions, including feelings of depression, sadness, or anxiety. ? Any medicines that you take for a mental health condition. These include herbs and supplements.  Home instructions to prepare for pregnancy Lifestyle  Eat a balanced diet. This includes fresh fruits and vegetables, whole grains, lean meats, low-fat dairy products, healthy fats, and foods that are high in fiber. Ask to meet with a nutritionist or registered dietitian for assistance with meal planning and goals.  Get regular exercise. Try to be active for at least 30 minutes a day on most days of the week. Ask your health care provider which activities are safe during pregnancy.  Do not use any products that contain nicotine or tobacco, such as cigarettes and e-cigarettes. If you need help quitting, ask your health care provider.  Do not drink alcohol.  Do not take illegal drugs.  Maintain a healthy weight. Ask your health care provider what weight range is   right for you.  General instructions  Keep an accurate record of your menstrual periods. This makes it easier for your health care provider to determine your baby's due date.  Begin taking prenatal vitamins and folic acid supplements daily as directed by  your health care provider.  Manage any chronic conditions, such as high blood pressure and diabetes, as told by your health care provider. This is important.  How do I know that I am pregnant? You may be pregnant if you have been sexually active and you miss your period. Symptoms of early pregnancy include:  Mild cramping.  Very light vaginal bleeding (spotting).  Feeling unusually tired.  Nausea and vomiting (morning sickness).  If you have any of these symptoms and you suspect that you might be pregnant, you can take a home pregnancy test. These tests check for a hormone in your urine (human chorionic gonadotropin, or hCG). A woman's body begins to make this hormone during early pregnancy. These tests are very accurate. Wait until at least the first day after you miss your period to take one. If the test shows that you are pregnant (you get a positive result), call your health care provider to make an appointment for prenatal care. What should I do if I become pregnant?  Make an appointment with your health care provider as soon as you suspect you are pregnant.  Do not use any products that contain nicotine, such as cigarettes, chewing tobacco, and e-cigarettes. If you need help quitting, ask your health care provider.  Do not drink alcoholic beverages. Alcohol is related to a number of birth defects.  Avoid toxic odors and chemicals.  You may continue to have sexual intercourse if it does not cause pain or other problems, such as vaginal bleeding. This information is not intended to replace advice given to you by your health care provider. Make sure you discuss any questions you have with your health care provider. Document Released: 01/23/2008 Document Revised: 10/08/2015 Document Reviewed: 09/01/2015 Elsevier Interactive Patient Education  2017 [employer]  

## 2016-06-02 NOTE — Progress Notes (Signed)
Subjective:     Patient ID: Patricia Irwin, female   DOB: Apr 26, 1990, 26 y.o.   MRN: [id number]  HPI Patricia Irwin is a 26 year old white female, married in for ER follow up, was seen 3/27 for abdominal pain and found to have ovarian cyst on CT.She has not had periods since November and has had irregular cycles in past, and would like to be pregnant. Has history of seizures and pseudoseizures.   Review of Systems Missed period Hx ovarian cyst  Irregular cycles Nausea at times and cramps Reviewed past medical,surgical, social and family history. Reviewed medications and allergies.     Objective:   Physical Exam BP 108/60 (BP Location: Left Arm, Patient Position: Sitting, Cuff Size: Normal)   Pulse 60   Ht  (1.6 m)   Wt 174 lb (78.9 kg)   LMP 01/09/2016   BMI 30.82 kg/m UPT negative.Skin warm and dry. Neck: mid line trachea, normal thyroid, good ROM, no lymphadenopathy noted. Lungs: clear to ausculation bilaterally. Cardiovascular: regular rate and rhythm.   Discussed stopping smoking, taking PNV with folic acid, trying provera to get withdrawal bleed, then keep period calendar and discussed timing of sex.  Assessment:     1. Missed periods   2. History of ovarian cyst   3. Pregnancy examination or test, negative result   4. Patient desires pregnancy       Plan:     Rx provera 10 mg #10 take 1 daily Call with period Take OTC PNV with folic acid Follow up in 4 weeks  Review handout on preparing for pregnancy

## 2016-06-17 ENCOUNTER — Ambulatory Visit (INDEPENDENT_AMBULATORY_CARE_PROVIDER_SITE_OTHER): Payer: Self-pay | Admitting: Adult Health

## 2016-06-17 ENCOUNTER — Encounter: Payer: Self-pay | Admitting: Adult Health

## 2016-06-17 VITALS — BP 112/68 | HR 70 | Ht 63.0 in | Wt 174.0 lb

## 2016-06-17 DIAGNOSIS — Z8742 Personal history of other diseases of the female genital tract: Secondary | ICD-10-CM

## 2016-06-17 DIAGNOSIS — N926 Irregular menstruation, unspecified: Secondary | ICD-10-CM

## 2016-06-17 DIAGNOSIS — Z319 Encounter for procreative management, unspecified: Secondary | ICD-10-CM

## 2016-06-17 NOTE — Progress Notes (Signed)
Subjective:     Patient ID: Patricia Irwin, female   DOB: November 25, 1990, 26 y.o.   MRN: [id number]  HPI Taliah is a 26 year old white female back in follow up of taking provera to get a period and it worked. She has history of ovarian cysts but has no pain now.  Review of Systems +bleeding after provera No pain  Reviewed past medical,surgical, social and family history. Reviewed medications and allergies.     Objective:   Physical Exam BP 112/68 (BP Location: Right Arm, Patient Position: Sitting, Cuff Size: Normal)   Pulse 70   Ht  (1.6 m)   Wt 174 lb (78.9 kg)   LMP 06/15/2016 (Exact Date)   BMI 30.82 kg/m   Talk only,took provera and period started 4/23 and is a little heavy, she wants to be get pregnant.    Assessment:     1. History of ovarian cyst   2. Missed periods   3. Patient desires pregnancy       Plan:    Keep period calendar, may need cycling with OCs if irregular  Take PNV and folic acid Discussed having regular intercourse  Follow up prn

## 2016-08-03 ENCOUNTER — Encounter (HOSPITAL_COMMUNITY): Payer: Self-pay | Admitting: Emergency Medicine

## 2016-08-03 ENCOUNTER — Emergency Department (HOSPITAL_COMMUNITY)
Admission: EM | Admit: 2016-08-03 | Discharge: 2016-08-04 | Disposition: A | Discharge status: Home / Self Care | Payer: Medicaid Other | Attending: Emergency Medicine | Admitting: Emergency Medicine

## 2016-08-03 DIAGNOSIS — R21 Rash and other nonspecific skin eruption: Secondary | ICD-10-CM | POA: Insufficient documentation

## 2016-08-03 DIAGNOSIS — F17210 Nicotine dependence, cigarettes, uncomplicated: Secondary | ICD-10-CM | POA: Insufficient documentation

## 2016-08-03 DIAGNOSIS — Z91040 Latex allergy status: Secondary | ICD-10-CM | POA: Insufficient documentation

## 2016-08-03 DIAGNOSIS — Z79899 Other long term (current) drug therapy: Secondary | ICD-10-CM | POA: Insufficient documentation

## 2016-08-03 MED ORDER — OXYCODONE-ACETAMINOPHEN 5-325 MG PO TABS: 1.0000 | ORAL_TABLET | ORAL | 0 refills | Status: DC | PRN

## 2016-08-03 MED ORDER — VALACYCLOVIR HCL 500 MG PO TABS
1000.0000 mg | ORAL_TABLET | ORAL | Status: AC
  Administered 2016-08-03: 1000 mg via ORAL
  Filled 2016-08-03: qty 2

## 2016-08-03 MED ORDER — VALACYCLOVIR HCL 1 G PO TABS: 1000.0000 mg | ORAL_TABLET | Freq: Three times a day (TID) | ORAL | 0 refills | Status: DC

## 2016-08-03 MED ORDER — OXYCODONE-ACETAMINOPHEN 5-325 MG PO TABS
1.0000 | ORAL_TABLET | Freq: Once | ORAL | Status: AC
  Administered 2016-08-03: 1 via ORAL
  Filled 2016-08-03: qty 1

## 2016-08-03 MED ORDER — PREDNISONE 50 MG PO TABS
60.0000 mg | ORAL_TABLET | Freq: Once | ORAL | Status: AC
  Administered 2016-08-03: 60 mg via ORAL
  Filled 2016-08-03: qty 1

## 2016-08-03 MED ORDER — PREDNISONE 20 MG PO TABS: ORAL_TABLET | ORAL | 0 refills | Status: DC

## 2016-08-03 NOTE — ED Triage Notes (Signed)
Pt c/o rash to the left thigh, left calf and bilateral ankles x 2 weeks.

## 2016-08-03 NOTE — Discharge Instructions (Signed)
It is not 100% clear whether you're rash is from poison ivy, or shingles. The prednisone you are given with be effective for either one. The valacyclovir you're given is just for shingles. Please follow-up with your primary care physician for further evaluation and treatment.

## 2016-08-03 NOTE — ED Provider Notes (Signed)
[facility] DEPT Provider Note   CSN: [id number] Arrival date & time: 08/03/16  2309     History   Chief Complaint Chief Complaint  Patient presents with  . Rash    HPI Patricia Irwin is a 26 y.o. female.  The history is provided by the patient.  She complains of a rash on her left thigh for the last 2 weeks. It is both itchy and painful. She came in tonight because the pain was preventing her from sleeping. The rash consists of reddish blisters which have some weeping. She thought it was poison ivy initially, but it is not improving with Caladryl. She states she did have chickenpox as a child.  Past Medical History:  Diagnosis Date  . Anxiety   . Chronic headache   . Chronic knee pain   . Hx of electroencephalogram 12/2013 ([facility])   normal, dx non-epileptic seizures  . Hypoglycemia   . Noncompliance with medications   . Pseudoseizures   . PTSD (post-traumatic stress disorder)   . Seizures ([facility])   . Vaginal Pap smear, abnormal     Patient Active Problem List   Diagnosis Date Noted  . Patient desires pregnancy 06/02/2016  . History of ovarian cyst 06/02/2016  . Missed periods 06/02/2016    Past Surgical History:  Procedure Laterality Date  . WISDOM TOOTH EXTRACTION      OB History    Gravida Para Term Preterm AB Living   0             SAB TAB Ectopic Multiple Live Births                   Home Medications    Prior to Admission medications   Medication Sig Start Date End Date Taking? Authorizing Provider  Cariprazine HCl (VRAYLAR) 3 MG CAPS Take 3 mg by mouth at bedtime.   Yes [provider]  clonazePAM (KLONOPIN) 1 MG tablet Take 1 mg by mouth 3 (three) times daily.   Yes [provider]  escitalopram (LEXAPRO) 10 MG tablet Take 10 mg by mouth daily.   Yes [provider]  levETIRAcetam (KEPPRA XR) 500 MG 24 hr tablet Take 2 tablets (1,000 mg total) by mouth daily. Patient taking differently: Take 2,000 mg by mouth daily.   04/07/13  Yes Triplett, Tammy, PA-C  propranolol (INDERAL) 20 MG tablet Take 20 mg by mouth daily as needed (for high blood pressure).   Yes [provider]  oxyCODONE-acetaminophen (PERCOCET) 5-325 MG tablet Take 1 tablet by mouth every 4 (four) hours as needed for moderate pain. 08/03/16   Dione BoozeGlick, Jumaane Weatherford, MD  oxyCODONE-acetaminophen (PERCOCET) 5-325 MG tablet Take 1 tablet by mouth every 4 (four) hours as needed for moderate pain. 08/03/16   Dione BoozeGlick, Daneya Hartgrove, MD  predniSONE (DELTASONE) 20 MG tablet 2 tabs po daily x 4 days 08/03/16   Dione BoozeGlick, Jadalyn Oliveri, MD  valACYclovir (VALTREX) 1000 MG tablet Take 1 tablet (1,000 mg total) by mouth 3 (three) times daily. 08/03/16   Dione BoozeGlick, Bobetta Korf, MD    Family History Family History  Problem Relation Age of Onset  . Diabetes Father   . Heart disease Father   . Hypertension Father   . Diabetes Paternal Grandfather   . Hypertension Paternal Grandfather   . Diabetes Paternal Grandmother   . Hypertension Paternal Grandmother   . Ovarian cancer Maternal Grandmother   . Diabetes Maternal Grandmother   . Diabetes Maternal Grandfather   . Bipolar disorder Mother   .  Cancer Maternal Aunt        skin    Social History Social History  Substance Use Topics  . Smoking status: Current Every Day Smoker    Packs/day: 0.50    Types: Cigarettes  . Smokeless tobacco: Never Used  . Alcohol use No     Comment: occasional     Allergies   Dilantin [phenytoin]; Amoxicillin; Amoxicillin-pot clavulanate; Bee venom; Celexa [citalopram hydrobromide]; Cinnamon; Influenza vaccines; Metronidazole; Penicillins; Tape; Latex; and Trintellix [vortioxetine]   Review of Systems Review of Systems  All other systems reviewed and are negative.    Physical Exam Updated Vital Signs BP 123/84 (BP Location: Right Arm)   Pulse 83   Temp 97.9 F (36.6 C) (Oral)   Resp 18   Ht 5\' 3"  (1.6 m)   Wt 78.9 kg (174 lb)   LMP 07/31/2016   SpO2 97%   BMI 30.82 kg/m   Physical Exam    Nursing note and vitals reviewed.  27 year old female, resting comfortably and in no acute distress. Vital signs are normal. Oxygen saturation is 97%, which is normal. Head is normocephalic and atraumatic. PERRLA, EOMI. Oropharynx is clear. Neck is nontender and supple without adenopathy or JVD. Back is nontender and there is no CVA tenderness. Lungs are clear without rales, wheezes, or rhonchi. Chest is nontender. Heart has regular rate and rhythm without murmur. Abdomen is soft, flat, nontender without masses or hepatosplenomegaly and peristalsis is normoactive. Extremities have no cyanosis or edema, full range of motion is present. Skin is warm and dry. Rash is present in the medial aspect of the left thigh. Rash consists of vesicles-some of which are in a linear arrangement which would be suggestive of poison ivy dermatitis. Vesicles are on an erythematous space. There are other erythematous lesions in the distal part of her left leg which could be insect bites, but also might be satellite lesions of poison ivy.  Neurologic: Mental status is normal, cranial nerves are intact, there are no motor or sensory deficits.  ED Treatments / Results   Procedures Procedures (including critical care time)  Medications Ordered in ED Medications  oxyCODONE-acetaminophen (PERCOCET/ROXICET) 5-325 MG per tablet 1 tablet (not administered)  valACYclovir (VALTREX) tablet 1,000 mg (not administered)  predniSONE (DELTASONE) tablet 60 mg (not administered)     Initial Impression / Assessment and Plan / ED Course  I have reviewed the triage vital signs and the nursing notes.  Rash which has characteristics of both herpes zoster and poison ivy dermatitis. There are also atypical features for both of them. Degree of pain is more consistent with herpes zoster, but areas that appear to have spread, raised blisters, and weeping or more typical of poison ivy. If it is shingles, I am concerned that it may be  too late to treat for effective prevention of postherpetic neuralgia. She is discharged with prescriptions for prednisone and valacyclovir, and oxycodone-acetaminophen for pain. She is to follow-up with PCP. Advised that if rash goes away with several days of prednisone, it is likely poison ivy. If it does not improve dramatically with a few days of steroids, it is likely herpes zoster.  Final Clinical Impressions(s) / ED Diagnoses   Final diagnoses:  Rash    New Prescriptions New Prescriptions   OXYCODONE-ACETAMINOPHEN (PERCOCET) 5-325 MG TABLET    Take 1 tablet by mouth every 4 (four) hours as needed for moderate pain.   OXYCODONE-ACETAMINOPHEN (PERCOCET) 5-325 MG TABLET    Take 1 tablet by  mouth every 4 (four) hours as needed for moderate pain.   PREDNISONE (DELTASONE) 20 MG TABLET    2 tabs po daily x 4 days   VALACYCLOVIR (VALTREX) 1000 MG TABLET    Take 1 tablet (1,000 mg total) by mouth 3 (three) times daily.     Dione Booze, MD 08/03/16 2352

## 2016-08-11 MED FILL — Oxycodone w/ Acetaminophen Tab 5-325 MG: ORAL | Qty: 6 | Status: AC

## 2016-08-31 ENCOUNTER — Encounter (HOSPITAL_COMMUNITY): Payer: Self-pay | Admitting: *Deleted

## 2016-08-31 ENCOUNTER — Emergency Department (HOSPITAL_COMMUNITY): Payer: Self-pay

## 2016-08-31 ENCOUNTER — Emergency Department (HOSPITAL_COMMUNITY)
Admission: EM | Admit: 2016-08-31 | Discharge: 2016-08-31 | Disposition: A | Discharge status: Home / Self Care | Payer: Self-pay | Attending: Emergency Medicine | Admitting: Emergency Medicine

## 2016-08-31 DIAGNOSIS — Y999 Unspecified external cause status: Secondary | ICD-10-CM | POA: Insufficient documentation

## 2016-08-31 DIAGNOSIS — Y33XXXA Other specified events, undetermined intent, initial encounter: Secondary | ICD-10-CM | POA: Insufficient documentation

## 2016-08-31 DIAGNOSIS — F17210 Nicotine dependence, cigarettes, uncomplicated: Secondary | ICD-10-CM | POA: Insufficient documentation

## 2016-08-31 DIAGNOSIS — Z79899 Other long term (current) drug therapy: Secondary | ICD-10-CM | POA: Insufficient documentation

## 2016-08-31 DIAGNOSIS — Z91040 Latex allergy status: Secondary | ICD-10-CM | POA: Insufficient documentation

## 2016-08-31 DIAGNOSIS — S161XXA Strain of muscle, fascia and tendon at neck level, initial encounter: Secondary | ICD-10-CM | POA: Insufficient documentation

## 2016-08-31 DIAGNOSIS — Y9389 Activity, other specified: Secondary | ICD-10-CM | POA: Insufficient documentation

## 2016-08-31 DIAGNOSIS — Y92003 Bedroom of unspecified non-institutional (private) residence as the place of occurrence of the external cause: Secondary | ICD-10-CM | POA: Insufficient documentation

## 2016-08-31 LAB — URINALYSIS, ROUTINE W REFLEX MICROSCOPIC
Bilirubin Urine: NEGATIVE
GLUCOSE, UA: NEGATIVE mg/dL
Ketones, ur: NEGATIVE mg/dL
Leukocytes, UA: NEGATIVE
Nitrite: NEGATIVE
PH: 5 (ref 5.0–8.0)
PROTEIN: NEGATIVE mg/dL
SPECIFIC GRAVITY, URINE: 1.004 — AB (ref 1.005–1.030)

## 2016-08-31 LAB — CBC WITH DIFFERENTIAL/PLATELET
BASOS PCT: 1 %
Basophils Absolute: 0 10*3/uL (ref 0.0–0.1)
EOS ABS: 0.1 10*3/uL (ref 0.0–0.7)
EOS PCT: 1 %
HCT: 41.7 % (ref 36.0–46.0)
Hemoglobin: 13.8 g/dL (ref 12.0–15.0)
LYMPHS ABS: 3.9 10*3/uL (ref 0.7–4.0)
Lymphocytes Relative: 52 %
MCH: 29.2 pg (ref 26.0–34.0)
MCHC: 33.1 g/dL (ref 30.0–36.0)
MCV: 88.3 fL (ref 78.0–100.0)
Monocytes Absolute: 0.6 10*3/uL (ref 0.1–1.0)
Monocytes Relative: 7 %
Neutro Abs: 3 10*3/uL (ref 1.7–7.7)
Neutrophils Relative %: 39 %
PLATELETS: 349 10*3/uL (ref 150–400)
RBC: 4.72 MIL/uL (ref 3.87–5.11)
RDW: 15 % (ref 11.5–15.5)
WBC: 7.6 10*3/uL (ref 4.0–10.5)

## 2016-08-31 LAB — RAPID URINE DRUG SCREEN, HOSP PERFORMED
AMPHETAMINES: NOT DETECTED
BENZODIAZEPINES: NOT DETECTED
Barbiturates: NOT DETECTED
Cocaine: NOT DETECTED
Opiates: NOT DETECTED
TETRAHYDROCANNABINOL: NOT DETECTED

## 2016-08-31 LAB — BASIC METABOLIC PANEL
Anion gap: 7 (ref 5–15)
CO2: 26 mmol/L (ref 22–32)
CREATININE: 0.73 mg/dL (ref 0.44–1.00)
Calcium: 8.8 mg/dL — ABNORMAL LOW (ref 8.9–10.3)
Chloride: 107 mmol/L (ref 101–111)
GFR calc Af Amer: >60 mL/min (ref >60)
Glucose, Bld: 84 mg/dL (ref 65–99)
POTASSIUM: 3.7 mmol/L (ref 3.5–5.1)
SODIUM: 140 mmol/L (ref 135–145)

## 2016-08-31 LAB — PREGNANCY, URINE: PREG TEST UR: NEGATIVE

## 2016-08-31 MED ORDER — NAPROXEN 500 MG PO TABS: 500.0000 mg | ORAL_TABLET | Freq: Two times a day (BID) | ORAL | 0 refills | Status: DC

## 2016-08-31 MED ORDER — METHOCARBAMOL 500 MG PO TABS: 500.0000 mg | ORAL_TABLET | Freq: Two times a day (BID) | ORAL | 0 refills | Status: DC

## 2016-08-31 MED ORDER — HYDROCODONE-ACETAMINOPHEN 5-325 MG PO TABS
1.0000 | ORAL_TABLET | Freq: Once | ORAL | Status: AC
  Administered 2016-08-31: 1 via ORAL
  Filled 2016-08-31: qty 1

## 2016-08-31 MED ORDER — IBUPROFEN 800 MG PO TABS
800.0000 mg | ORAL_TABLET | Freq: Once | ORAL | Status: AC
  Administered 2016-08-31: 800 mg via ORAL
  Filled 2016-08-31: qty 1

## 2016-08-31 MED ORDER — DIAZEPAM 5 MG PO TABS
5.0000 mg | ORAL_TABLET | Freq: Once | ORAL | Status: AC
  Administered 2016-08-31: 5 mg via ORAL
  Filled 2016-08-31: qty 1

## 2016-08-31 NOTE — ED Triage Notes (Signed)
Pt c/o 8 seizures tonight and neck pain, pt states that her seizures are a result of PTSD and she thinks that she injured her neck during one of the seizures,

## 2016-08-31 NOTE — ED Provider Notes (Signed)
[facility] DEPT Provider Note   CSN: [id number] Arrival date & time: 08/31/16  0223     History   Chief Complaint Chief Complaint  Patient presents with  . Neck Pain  . Seizures    HPI Patricia Irwin is a 26 y.o. female.  Patient states her husband told her she had "7 or 8" of her nonepileptic seizures tonight while laying in bed. She reports these happened within 30 minutes. There is no tongue biting or incontinence. There is no post ictal phase. She denies any recent epileptic seizures. 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 She comes in because she thinks she injured her neck during one of the seizures. She had no neck pain prior to this. Denies falling or hitting her head. No focal weakness, numbness or tingling. No fever. She denies any alcohol or drug use. She reports that she may have jerked her neck awkwardly while having a seizure. She continues to take Keppra. She is also on Klonopin and denies any missed doses.   The history is provided by the patient.  Neck Pain   Pertinent negatives include no chest pain.  Seizures   Pertinent negatives include no chest pain, no cough, no nausea and no vomiting.    Past Medical History:  Diagnosis Date  . Anxiety   . Chronic headache   . Chronic knee pain   . Hx of electroencephalogram 12/2013 ([facility])   normal, dx non-epileptic seizures  . Hypoglycemia   . Noncompliance with medications   . Pseudoseizures   . PTSD (post-traumatic stress disorder)   . Seizures (HCC)   . Vaginal Pap smear, abnormal     Patient Active Problem List   Diagnosis Date Noted  . Patient desires pregnancy 06/02/2016  . History of ovarian cyst 06/02/2016  . Missed periods 06/02/2016    Past Surgical History:  Procedure Laterality Date  . WISDOM TOOTH EXTRACTION      OB History    Gravida Para Term Preterm AB Living   0             SAB TAB Ectopic Multiple Live Births                    Home Medications    Prior to Admission medications   Medication Sig Start Date End Date Taking? Authorizing Provider  Cariprazine HCl (VRAYLAR) 3 MG CAPS Take 3 mg by mouth at bedtime.    [provider]  clonazePAM (KLONOPIN) 1 MG tablet Take 1 mg by mouth 3 (three) times daily.    [provider]  escitalopram (LEXAPRO) 10 MG tablet Take 10 mg by mouth daily.    [provider]  levETIRAcetam (KEPPRA XR) 500 MG 24 hr tablet Take 2 tablets (1,000 mg total) by mouth daily. Patient taking differently: Take 2,000 mg by mouth daily.  04/07/13   Triplett, Tammy, PA-C  oxyCODONE-acetaminophen (PERCOCET) 5-325 MG tablet Take 1 tablet by mouth every 4 (four) hours as needed for moderate pain. 08/03/16   Dione Booze, MD  oxyCODONE-acetaminophen (PERCOCET) 5-325 MG tablet Take 1 tablet by mouth every 4 (four) hours as needed for moderate pain. 08/03/16   Dione Booze, MD  predniSONE (DELTASONE) 20 MG tablet 2 tabs po daily x 4 days 08/03/16   Dione Booze, MD  propranolol (INDERAL) 20 MG tablet Take 20 mg by mouth daily as needed (for high blood pressure).    [provider]  valACYclovir (VALTREX) 1000 MG tablet Take 1 tablet (1,000 mg total) by mouth 3 (three) times daily. 08/03/16   Dione Booze, MD    Family History Family History  Problem Relation Age of Onset  . Diabetes Father   . Heart disease Father   . Hypertension Father   . Diabetes Paternal Grandfather   . Hypertension Paternal Grandfather   . Diabetes Paternal Grandmother   . Hypertension Paternal Grandmother   . Ovarian cancer Maternal Grandmother   . Diabetes Maternal Grandmother   . Diabetes Maternal Grandfather   . Bipolar disorder Mother   . Cancer Maternal Aunt        skin    Social History Social History  Substance Use Topics  . Smoking status: Current Every Day Smoker    Packs/day: 0.50    Types: Cigarettes  . Smokeless tobacco: Never Used  . Alcohol use No     Comment:  occasional     Allergies   Dilantin [phenytoin]; Amoxicillin; Amoxicillin-pot clavulanate; Bee venom; Celexa [citalopram hydrobromide]; Cinnamon; Influenza vaccines; Metronidazole; Penicillins; Tape; Latex; and Trintellix [vortioxetine]   Review of Systems Review of Systems  Constitutional: Negative for activity change, appetite change and fever.  HENT: Negative for congestion and rhinorrhea.   Respiratory: Negative for cough, chest tightness and shortness of breath.   Cardiovascular: Negative for chest pain.  Gastrointestinal: Negative for abdominal pain, nausea and vomiting.  Genitourinary: Negative for dysuria and hematuria.  Musculoskeletal: Positive for arthralgias, myalgias and neck pain.  Neurological: Positive for seizures.    all other systems are negative except as noted in the HPI and PMH.    Physical Exam Updated Vital Signs BP 111/78   Pulse 67   Temp 97.9 F (36.6 C) (Oral)   Resp 18   Ht 5\' 3"  (1.6 m)   Wt 81.6 kg (180 lb)   LMP 07/31/2016   SpO2 99%   BMI 31.89 kg/m   Physical Exam  Constitutional: She is oriented to person, place, and time. She appears well-developed and well-nourished. No distress.  HENT:  Head: Normocephalic and atraumatic.  Mouth/Throat: Oropharynx is clear and moist. No oropharyngeal exudate.  Eyes: Conjunctivae and EOM are normal. Pupils are equal, round, and reactive to light.  Neck: Neck supple.  TTP R paraspinal cervical tenderness.  No midline tenderness Pain with attempted ROM.   Cardiovascular: Normal rate, regular rhythm, normal heart sounds and intact distal pulses.   No murmur heard. Pulmonary/Chest: Effort normal and breath sounds normal. No respiratory distress.  Abdominal: Soft. There is no tenderness. There is no rebound and no guarding.  Musculoskeletal: Normal range of motion. She exhibits no edema or tenderness.  Neurological: She is alert and oriented to person, place, and time. No cranial nerve deficit. She  exhibits normal muscle tone. Coordination normal.   5/5 strength throughout. CN 2-12 intact.Equal grip strength.   Skin: Skin is warm.  Psychiatric: She has a normal mood and affect. Her behavior is normal.  Flat affect  Nursing note and vitals reviewed.    ED Treatments / Results  Labs (all labs ordered are listed, but only abnormal results are displayed) Labs Reviewed  URINALYSIS, ROUTINE W REFLEX MICROSCOPIC - Abnormal; Notable for the following:       Result Value   Color, Urine STRAW (*)    Specific Gravity, Urine 1.004 (*)    Hgb urine dipstick SMALL (*)    Bacteria, UA RARE (*)    Squamous Epithelial / LPF 0-5 (*)  All other components within normal limits  PREGNANCY, URINE  RAPID URINE DRUG SCREEN, HOSP PERFORMED  CBC WITH DIFFERENTIAL/PLATELET  BASIC METABOLIC PANEL    EKG  EKG Interpretation  Date/Time:  Monday August 31 2016 02:43:03 EDT Ventricular Rate:  67 PR Interval:    QRS Duration: 93 QT Interval:  393 QTC Calculation: 415 R Axis:   78 Text Interpretation:  Sinus rhythm Borderline Q waves in inferior leads No significant change was found Confirmed by Glynn Octaveancour, Sayda Grable [phone]) on 08/31/2016 2:46:33 AM       Radiology Dg Cervical Spine Complete  Result Date: 08/31/2016 CLINICAL DATA:  26 year old female with neck pain after seizure. EXAM: CERVICAL SPINE - COMPLETE 4+ VIEW COMPARISON:  Cervical spine radiograph dated 09/03/2015 FINDINGS: There is no evidence of cervical spine fracture or prevertebral soft tissue swelling. Alignment is normal. No other significant bone abnormalities are identified. IMPRESSION: Negative cervical spine radiographs. Electronically Signed   By: Elgie CollardArash  Radparvar M.D.   On: 08/31/2016 04:31    Procedures Procedures (including critical care time)  Medications Ordered in ED Medications  diazepam (VALIUM) tablet 5 mg (5 mg Oral Given 08/31/16 0352)  HYDROcodone-acetaminophen (NORCO/VICODIN) 5-325 MG per tablet 1 tablet (1 tablet  Oral Given 08/31/16 0353)  ibuprofen (ADVIL,MOTRIN) tablet 800 mg (800 mg Oral Given 08/31/16 0352)     Initial Impression / Assessment and Plan / ED Course  I have reviewed the triage vital signs and the nursing notes.  Pertinent labs & imaging results that were available during my care of the patient were reviewed by me and considered in my medical decision making (see chart for details).    Patient presenting with neck pain after episodes of nonepileptic seizures by her report. No fever. No weakness, numbness or tingling. No direct trauma.  Neurovascularly intact. No meningismus. Suspect cervical strain from nonepileptic seizures. We'll treat with anti-inflammatories and muscle relaxers. C spine Xray negative.   She is not pregnant. Vitals are reassuring. She is on chronic benzodiazepines which she states compliance with and there is no evidence of ongoing benzodiazepine withdrawal. Follow up with PCP. Return precautions discussed.  Final Clinical Impressions(s) / ED Diagnoses   Final diagnoses:  Acute strain of neck muscle, initial encounter    New Prescriptions New Prescriptions   No medications on file     Glynn Octaveancour, Riad Wagley, MD 08/31/16 [phone]

## 2016-09-14 ENCOUNTER — Emergency Department (HOSPITAL_COMMUNITY)
Admission: EM | Admit: 2016-09-14 | Discharge: 2016-09-14 | Disposition: A | Discharge status: Home / Self Care | Payer: Medicaid Other | Attending: Emergency Medicine | Admitting: Emergency Medicine

## 2016-09-14 ENCOUNTER — Encounter (HOSPITAL_COMMUNITY): Payer: Self-pay

## 2016-09-14 DIAGNOSIS — F17210 Nicotine dependence, cigarettes, uncomplicated: Secondary | ICD-10-CM | POA: Insufficient documentation

## 2016-09-14 DIAGNOSIS — R569 Unspecified convulsions: Secondary | ICD-10-CM

## 2016-09-14 DIAGNOSIS — F445 Conversion disorder with seizures or convulsions: Secondary | ICD-10-CM | POA: Insufficient documentation

## 2016-09-14 LAB — I-STAT CHEM 8, ED
BUN: 5 mg/dL — AB (ref 6–20)
CHLORIDE: 101 mmol/L (ref 101–111)
CREATININE: 0.7 mg/dL (ref 0.44–1.00)
Calcium, Ion: 1.2 mmol/L (ref 1.15–1.40)
Glucose, Bld: 93 mg/dL (ref 65–99)
HEMATOCRIT: 42 % (ref 36.0–46.0)
Hemoglobin: 14.3 g/dL (ref 12.0–15.0)
Potassium: 4.4 mmol/L (ref 3.5–5.1)
SODIUM: 141 mmol/L (ref 135–145)
TCO2: 26 mmol/L (ref 0–100)

## 2016-09-14 LAB — PREGNANCY, URINE: Preg Test, Ur: NEGATIVE

## 2016-09-14 MED ORDER — METHOCARBAMOL 500 MG PO TABS
500.0000 mg | ORAL_TABLET | Freq: Once | ORAL | Status: AC
  Administered 2016-09-14: 500 mg via ORAL
  Filled 2016-09-14: qty 1

## 2016-09-14 MED ORDER — METHOCARBAMOL 500 MG PO TABS: 500.0000 mg | ORAL_TABLET | Freq: Two times a day (BID) | ORAL | 0 refills | Status: DC

## 2016-09-14 NOTE — Discharge Instructions (Signed)
You have been seen in the emergency department today for a nonepileptic seizure.  Your workup today including labs are within normal limits.  Please follow up with your doctor/neurologist as soon as possible regarding today's emergency department visit and your likely seizure.  As we have discussed it is very important that you do not drive until you have been seen and cleared by your neurologist.  Please drink plenty of fluids, get plenty of sleep and avoid any alcohol or drug use Please return to the emergency department if you have any further seizures which do not respond to medications, or for any other symptoms per se concerning for yourself.

## 2016-09-14 NOTE — ED Provider Notes (Signed)
Emergency Department Provider Note   I have reviewed the triage vital signs and the nursing notes.   HISTORY  Chief Complaint Seizures   HPI Patricia Irwin is a 26 y.o. female with PMH of pseudoseizure presents emergency department for evaluation of multiple pseudoseizure episodes today. Family estimates approximately 7 episodes. She describes feeling as if she may have a seizure followed by 30 seconds to 1 minute of seizure activity. Patient also has epilepsy but states this was most consistent with her pseudoseizure. She's been compliant with her Keppra and Klonopin. She notes some increased family stress which often brings these events about. Denies fevers, chills, chest pain, or difficulty breathing. She does have some right-sided neck and shoulder pain which started earlier this month with a pseudoseizure episode. That resolved but after her multiple episodes today the muscle strain has returned.    Past Medical History:  Diagnosis Date  . Anxiety   . Chronic headache   . Chronic knee pain   . Hx of electroencephalogram 12/2013 ([facility])   normal, dx non-epileptic seizures  . Hypoglycemia   . Noncompliance with medications   . Pseudoseizures   . PTSD (post-traumatic stress disorder)   . Seizures (HCC)   . Vaginal Pap smear, abnormal     Patient Active Problem List   Diagnosis Date Noted  . Patient desires pregnancy 06/02/2016  . History of ovarian cyst 06/02/2016  . Missed periods 06/02/2016    Past Surgical History:  Procedure Laterality Date  . WISDOM TOOTH EXTRACTION      Current Outpatient Rx  . Order #: [id number] Class: Historical Med  . Order #: [id number] Class: Historical Med  . Order #: [id number] Class: Historical Med  . Order #: [id number] Class: Print  . Order #: [id number] Class: Historical Med  . Order #: [id number] Class: Print  . Order #: [id number] Class: Print  . Order #: [id number] Class: Print  . Order #: [id number] Class: Print  . Order #:  [id number] Class: Print  . Order #: [id number] Class: Print    Allergies Dilantin [phenytoin]; Amoxicillin; Amoxicillin-pot clavulanate; Bee venom; Celexa [citalopram hydrobromide]; Cinnamon; Influenza vaccines; Metronidazole; Penicillins; Tape; Latex; and Trintellix [vortioxetine]  Family History  Problem Relation Age of Onset  . Diabetes Father   . Heart disease Father   . Hypertension Father   . Diabetes Paternal Grandfather   . Hypertension Paternal Grandfather   . Diabetes Paternal Grandmother   . Hypertension Paternal Grandmother   . Ovarian cancer Maternal Grandmother   . Diabetes Maternal Grandmother   . Diabetes Maternal Grandfather   . Bipolar disorder Mother   . Cancer Maternal Aunt        skin    Social History Social History  Substance Use Topics  . Smoking status: Current Every Day Smoker    Packs/day: 0.50    Types: Cigarettes  . Smokeless tobacco: Never Used  . Alcohol use No     Comment: occasional    Review of Systems  Constitutional: No fever/chills Eyes: No visual changes. ENT: No sore throat. Cardiovascular: Denies chest pain. Respiratory: Denies shortness of breath. Gastrointestinal: No abdominal pain.  No nausea, no vomiting.  No diarrhea.  No constipation. Genitourinary: Negative for dysuria. Musculoskeletal: Negative for back pain. Positive right sided neck pain.  Skin: Negative for rash. Neurological: Negative for headaches, focal weakness or numbness. Positive pseudoseizures x 7 today.   10-point ROS otherwise negative.  ____________________________________________   PHYSICAL EXAM:  VITAL SIGNS: ED Triage Vitals  Enc Vitals Group     BP 09/14/16  1411 133/73     Pulse Rate 09/14/16 1411 95     Resp 09/14/16 1411 18     Temp 09/14/16 1411 (!) 97.3 F (36.3 C)     Temp Source 09/14/16 1411 Oral     SpO2 09/14/16 1411 98 %     Weight 09/14/16 1412 180 lb (81.6 kg)     Height 09/14/16 1412 5\' 3"  (1.6 m)     Pain Score 09/14/16 1411  8   Constitutional: Alert and oriented. Well appearing and in no acute distress. Eyes: Conjunctivae are normal.  Head: Atraumatic. Nose: No congestion/rhinnorhea. Mouth/Throat: Mucous membranes are moist.  Oropharynx non-erythematous. Neck: No stridor.  Cardiovascular: Normal rate, regular rhythm. Good peripheral circulation. Grossly normal heart sounds.   Respiratory: Normal respiratory effort.  No retractions. Lungs CTAB. Gastrointestinal: Soft and nontender. No distention.  Musculoskeletal: No lower extremity tenderness nor edema. No gross deformities of extremities. Neurologic:  Normal speech and language. No gross focal neurologic deficits are appreciated. CN 2-12 intact.  Skin:  Skin is warm, dry and intact. No rash noted. Psychiatric: Mood and affect are normal. Speech and behavior are normal.  ____________________________________________   LABS (all labs ordered are listed, but only abnormal results are displayed)  Labs Reviewed  I-STAT CHEM 8, ED - Abnormal; Notable for the following:       Result Value   BUN 5 (*)    All other components within normal limits  PREGNANCY, URINE   ____________________________________________   PROCEDURES  Procedure(s) performed:   Procedures  None ____________________________________________   INITIAL IMPRESSION / ASSESSMENT AND PLAN / ED COURSE  Pertinent labs & imaging results that were available during my care of the patient were reviewed by me and considered in my medical decision making (see chart for details).  Patient resents to the emergency department for evaluation of Multiple non-epileptiform seizures today. Patient has Bensen Chadderdon history of these and states it felt similar to prior. She is currently awake, alert, oriented 4. She has no focal neurological deficits on my exam. She has been compliant with her medications at home. She reports increased family stress is a possible trigger. She is complaining of some right-sided  neck pain. No midline tenderness to palpation. Suspect muscle strain.   03:22 PM Labs reviewed with no acute abnormality. Encourage the patient to follow with her primary care physician and neurologist as an outpatient.   At this time, I do not feel there is any life-threatening condition present. I have reviewed and discussed all results (EKG, imaging, lab, urine as appropriate), exam findings with patient. I have reviewed nursing notes and appropriate previous records.  I feel the patient is safe to be discharged home without further emergent workup. Discussed usual and customary return precautions. Patient and family (if present) verbalize understanding and are comfortable with this plan.  Patient will follow-up with their primary care provider. If they do not have a primary care provider, information for follow-up has been provided to them. All questions have been answered.  ____________________________________________  FINAL CLINICAL IMPRESSION(S) / ED DIAGNOSES  Final diagnoses:  Nonepileptic episode (HCC)     MEDICATIONS GIVEN DURING THIS VISIT:  None  NEW OUTPATIENT MEDICATIONS STARTED DURING THIS VISIT:  None   Note:  This document was prepared using Dragon voice recognition software and may include unintentional dictation errors.  Alona Bene, MD Emergency Medicine   Zykiria Bruening, Arlyss Repress, MD 09/14/16 [phone]

## 2016-09-14 NOTE — ED Triage Notes (Signed)
Patient came into triage room with family and stated she was going to have a seizure, patient noted to lay on stretcher in triage. Patient had 4 episodes of witness seizure like activity then became aware to surroundings and alert & oriented x4. Patient denied wanting to be seen in ED, family talked patient into being seen.

## 2016-11-10 ENCOUNTER — Ambulatory Visit (INDEPENDENT_AMBULATORY_CARE_PROVIDER_SITE_OTHER): Payer: Self-pay | Admitting: Adult Health

## 2016-11-10 ENCOUNTER — Encounter: Payer: Self-pay | Admitting: Adult Health

## 2016-11-10 VITALS — BP 100/60 | HR 76 | Ht 63.0 in | Wt 180.0 lb

## 2016-11-10 DIAGNOSIS — N926 Irregular menstruation, unspecified: Secondary | ICD-10-CM

## 2016-11-10 DIAGNOSIS — Z3202 Encounter for pregnancy test, result negative: Secondary | ICD-10-CM

## 2016-11-10 DIAGNOSIS — Z319 Encounter for procreative management, unspecified: Secondary | ICD-10-CM

## 2016-11-10 DIAGNOSIS — Z87898 Personal history of other specified conditions: Secondary | ICD-10-CM

## 2016-11-10 DIAGNOSIS — Z8659 Personal history of other mental and behavioral disorders: Secondary | ICD-10-CM

## 2016-11-10 LAB — POCT URINE PREGNANCY: Preg Test, Ur: NEGATIVE

## 2016-11-10 NOTE — Addendum Note (Signed)
Addended by: Federico Flake A on: 11/10/2016 01:34 PM   Modules accepted: Orders

## 2016-11-10 NOTE — Progress Notes (Signed)
Subjective:     Patient ID: Patricia Irwin, female   DOB: 03/13/1990, 26 y.o.   MRN: [id number]  HPI Billye is a 26 year old white female, married in complaining of irregular periods and wants to be pregnant.She has discussed with neurologist Dr Daneen Schick at [facility] and also Dr Omelia Blackwater.   Review of Systems +irregular periods Reviewed past medical,surgical, social and family history. Reviewed medications and allergies.     Objective:   Physical Exam BP 100/60 (BP Location: Left Arm, Patient Position: Sitting, Cuff Size: Small)   Pulse 76   Ht  (1.6 m)   Wt 180 lb (81.6 kg)   LMP 10/21/2016   BMI 31.89 kg/m UPT negative PHQ 9 score 3, denies being suicidal, is on meds and sees Dr Omelia Blackwater. Discussed clomid as option, since she declines OCs to try to regulate periods.   Will check progesterone level today, to see if ovulated this month.  Assessment:     1. Irregular periods   2. Patient desires pregnancy   3. History of seizures   4. History of posttraumatic stress disorder (PTSD)       Plan:     Check progesterone level now Take OTC PNV and folic acid Will talk tomorrow when labs back F/U prn

## 2016-11-11 ENCOUNTER — Telehealth: Payer: Self-pay | Admitting: Adult Health

## 2016-11-11 LAB — PROGESTERONE: Progesterone: 0.4 ng/mL

## 2016-11-11 NOTE — Telephone Encounter (Signed)
Pt aware that she is not ovulating,make appt to discuss options with her history with MD

## 2016-11-19 ENCOUNTER — Ambulatory Visit (INDEPENDENT_AMBULATORY_CARE_PROVIDER_SITE_OTHER): Payer: Self-pay | Admitting: Obstetrics and Gynecology

## 2016-11-19 ENCOUNTER — Encounter: Payer: Self-pay | Admitting: Obstetrics and Gynecology

## 2016-11-19 VITALS — BP 100/70 | HR 80 | Ht 63.0 in | Wt 178.4 lb

## 2016-11-19 DIAGNOSIS — N970 Female infertility associated with anovulation: Secondary | ICD-10-CM

## 2016-11-19 MED ORDER — CLOMIPHENE CITRATE 50 MG PO TABS: 50.0000 mg | ORAL_TABLET | Freq: Every day | ORAL | 1 refills | Status: DC

## 2016-11-19 NOTE — Progress Notes (Signed)
Patient ID: Patricia Irwin, female   DOB: 1990-10-16, 26 y.o.   MRN: [id number]    [facility] Clinic Visit  @            Patient name: Patricia Irwin MRN [id number]  Date of birth: September 26, 1990  CC & HPI:  Patricia Irwin is a 26 y.o. female presenting today to discuss her options on getting pregnant. Per chart review, pt has irregular periods. Pt gets a period every month, however, notes it is a different time every month. She also has a hx of seizures and has discussed wanting to get pregnant with neurologists Dr. Daneen Schick and Dr. Omelia Blackwater. She has not undergone any GYN procedures. Pt denies any other sx at this time.   ROS:  ROS +irregular periods All systems are negative except as noted in the HPI and PMH.   Pertinent History Reviewed:   Reviewed: Significant for seizures Medical         Past Medical History:  Diagnosis Date  . Anxiety   . Chronic headache   . Chronic knee pain   . Hx of electroencephalogram 12/2013 ([facility])   normal, dx non-epileptic seizures  . Hypoglycemia   . Noncompliance with medications   . Pseudoseizures   . PTSD (post-traumatic stress disorder)   . Seizures (HCC)   . Vaginal Pap smear, abnormal                               Surgical Hx:    Past Surgical History:  Procedure Laterality Date  . WISDOM TOOTH EXTRACTION     Medications: Reviewed & Updated - see associated section                       Current Outpatient Prescriptions:  .  Cariprazine HCl (VRAYLAR) 3 MG CAPS, Take 3 mg by mouth at bedtime., Disp: , Rfl:  .  clonazePAM (KLONOPIN) 1 MG tablet, Take 1 mg by mouth 3 (three) times daily., Disp: , Rfl:  .  escitalopram (LEXAPRO) 10 MG tablet, Take 10 mg by mouth daily., Disp: , Rfl:  .  levETIRAcetam (KEPPRA XR) 500 MG 24 hr tablet, Take 2 tablets (1,000 mg total) by mouth daily. (Patient taking differently: Take 2,000 mg by mouth daily. ), Disp: 60 tablet, Rfl: 0 .  propranolol (INDERAL) 20 MG tablet, Take 20 mg by mouth daily as  needed (for high blood pressure)., Disp: , Rfl:    Social History: Reviewed -  reports that she has been smoking Cigarettes.  She has been smoking about 0.50 packs per day. She has never used smokeless tobacco.  Objective Findings:  Vitals: Blood pressure 100/70, pulse 80, height  (1.6 m), weight 178 lb 6.4 oz (80.9 kg), last menstrual period 10/21/2016.  Physical Examination:  General appearance - alert, well appearing, and in no distress and oriented to person, place, and time Mental status - alert, oriented to person, place, and time, normal mood, behavior, speech, dress, motor activity, and thought processes, affect appropriate to mood Neurological - alert, oriented, normal speech, no focal findings or movement disorder noted   Assessment & Plan:   A:  1. Non ovulatory 2.  3.  P:  1. Check thyroid 2. Husband is to get a semen analysis 3. Urine pregnancy test 4. Rx clomid 50 x 5d   By signing my name below, I, Patricia Irwin, attest that this  documentation has been prepared under the direction and in the presence of Tilda Burrow, MD. Electronically Signed: Diona Irwin, Medical Scribe. 11/19/16. 3:17 PM.  I personally performed the services described in this documentation, which was SCRIBED in my presence. The recorded information has been reviewed and considered accurate. It has been edited as necessary during review. Tilda Burrow, MD

## 2016-12-05 ENCOUNTER — Encounter: Payer: Self-pay | Admitting: Obstetrics and Gynecology

## 2016-12-10 LAB — PROGESTERONE: PROGESTERONE: 7.5 ng/mL

## 2016-12-10 LAB — TSH: TSH: 1.76 u[IU]/mL (ref 0.450–4.500)

## 2016-12-13 ENCOUNTER — Telehealth: Payer: Self-pay | Admitting: Obstetrics and Gynecology

## 2016-12-13 NOTE — Telephone Encounter (Signed)
Pt informed of ovulatory level of progesterone. Will stay on 50 mg/d clomid for now. Pt aware of normal TFT.

## 2016-12-21 ENCOUNTER — Other Ambulatory Visit: Payer: Self-pay | Admitting: Obstetrics and Gynecology

## 2016-12-23 ENCOUNTER — Encounter: Payer: Self-pay | Admitting: Obstetrics and Gynecology

## 2016-12-23 ENCOUNTER — Ambulatory Visit (INDEPENDENT_AMBULATORY_CARE_PROVIDER_SITE_OTHER): Payer: Self-pay | Admitting: Obstetrics and Gynecology

## 2016-12-23 VITALS — BP 126/72 | HR 83 | Ht 63.0 in | Wt 172.8 lb

## 2016-12-23 DIAGNOSIS — Z3202 Encounter for pregnancy test, result negative: Secondary | ICD-10-CM

## 2016-12-23 DIAGNOSIS — N970 Female infertility associated with anovulation: Secondary | ICD-10-CM

## 2016-12-23 LAB — POCT URINE PREGNANCY: Preg Test, Ur: NEGATIVE

## 2016-12-23 NOTE — Progress Notes (Signed)
[facility] Visit  12/23/2016            Patient name: Patricia Irwin MRN [id number]  Date of birth: [birth year]  CC & HPI:  Patricia Irwin is a 26 y.o. female presenting today for a f/u to discuss her options on getting pregnant, after taking Clomid 50mg  x5d, TSH test after previous visit on 11/19/16. Per chart review, pt has irregular periods, which occur monthly but on varied dates. She has not undergone any GYN procedures. Pt did ovulate last month, with progesterone levels of 7.5. Patient's last menstrual period was 12/20/2016 (exact date). She states she felt grumpy during her last  period, which was abnormal for her.  Pt was accompanied by her partner. She states her mother has thyroid problems.  ROS:  ROS +irregular periods All systems are negative except as noted in the HPI and PMH.     Pertinent History Reviewed:   Reviewed: Significant for seizures Medical         Past Medical History:  Diagnosis Date  . Anxiety   . Chronic headache   . Chronic knee pain   . Hx of electroencephalogram 12/2013 ([facility])   normal, dx non-epileptic seizures  . Hypoglycemia   . Noncompliance with medications   . Pseudoseizures   . PTSD (post-traumatic stress disorder)   . Seizures (HCC)   . Vaginal Pap smear, abnormal                               Surgical Hx:    Past Surgical History:  Procedure Laterality Date  . WISDOM TOOTH EXTRACTION     Medications: Reviewed & Updated - see associated section                       Current Outpatient Prescriptions:  .  Cariprazine HCl (VRAYLAR) 3 MG CAPS, Take 3 mg by mouth at bedtime., Disp: , Rfl:  .  clomiPHENE (CLOMID) 50 MG tablet, Take 1 tablet (50 mg total) by mouth daily. Days 3-7 of cycle, Disp: 5 tablet, Rfl: 1 .  clonazePAM (KLONOPIN) 1 MG tablet, Take 1 mg by mouth 3 (three) times daily., Disp: , Rfl:  .  escitalopram (LEXAPRO) 10 MG tablet, Take 10 mg by mouth daily., Disp: , Rfl:  .  levETIRAcetam (KEPPRA XR) 500 MG 24 hr  tablet, Take 2 tablets (1,000 mg total) by mouth daily. (Patient taking differently: Take 2,000 mg by mouth daily. ), Disp: 60 tablet, Rfl: 0 .  propranolol (INDERAL) 20 MG tablet, Take 20 mg by mouth daily as needed (for high blood pressure)., Disp: , Rfl:    Social History: Reviewed -  reports that she has been smoking Cigarettes.  She has been smoking about 0.25 packs per day. She has never used smokeless tobacco.  Objective Findings:  Vitals: Blood pressure 126/72, pulse 83, height 5\' 3"  (1.6 m), weight 172 lb 12.8 oz (78.4 kg), last menstrual period 12/20/2016.  Physical Examination: General appearance - alert, well appearing, and in no distress Mental status - alert, oriented to person, place, and time Pelvic - not indicated  Discussion: 1. Discussed with pt of her progesterone results and ovulation status. Pt advised to continue Clomid 50 mg x5 days and to see two more cycles of ovulation and normal hormone levels.  At end of discussion, pt had opportunity to ask questions and has no further questions at  this time.   Specific discussion as noted above. Greater than 50% was spent in counseling and coordination of care with the patient.   Total time greater than: 25 minutes.    Assessment & Plan:   A:  1. Patient desires pregnancy 2. Anovulation, responsive to clomid at 50 mg/d x 5 d  P:  1. See at least two more ovulations with normal hormone levels on cycle day 21 2. Continue Clomid 50 mg x5d 3. Pt will call to schedule appointment 4. PCT postcoital test) on day 12, 13 or 14 if pt ovulates in the next month cycle on clomid

## 2017-01-12 LAB — PROGESTERONE: Progesterone: 9.8 ng/mL

## 2017-01-21 ENCOUNTER — Telehealth: Payer: Self-pay | Admitting: Obstetrics and Gynecology

## 2017-01-21 ENCOUNTER — Other Ambulatory Visit: Payer: Self-pay | Admitting: Obstetrics and Gynecology

## 2017-01-22 NOTE — Telephone Encounter (Signed)
LMOVM that d/t her cycle falling on the weekend and a Monday that Dr Emelda FearFerguson wont be here, she will need to schedule the appt for the following month.

## 2017-01-22 NOTE — Telephone Encounter (Signed)
Clomid 50 mg daily times 5 days refill times 3 months

## 2017-01-31 IMAGING — CT CT HEAD W/O CM
1 series · 15 of 30 positions shown, 19 images · non-contrast
Comparison: CT of the head performed 07/05/2009

CLINICAL DATA: Acute onset of seizure. Dizziness and confusion. Hit
head. Initial encounter.

EXAM:
CT HEAD WITHOUT CONTRAST
TECHNIQUE: Contiguous axial images were obtained from the base of the skull
through the vertex without intravenous contrast.

[Series 2: head wo · axial · 0.47mm/px · z∈[-168,-42]mm · 15 of 32 slices shown, 19 images]
[im 2/32  brain]
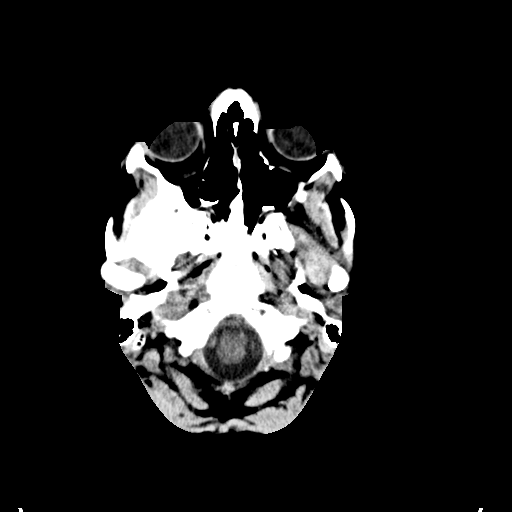
[im 2/32  bone]
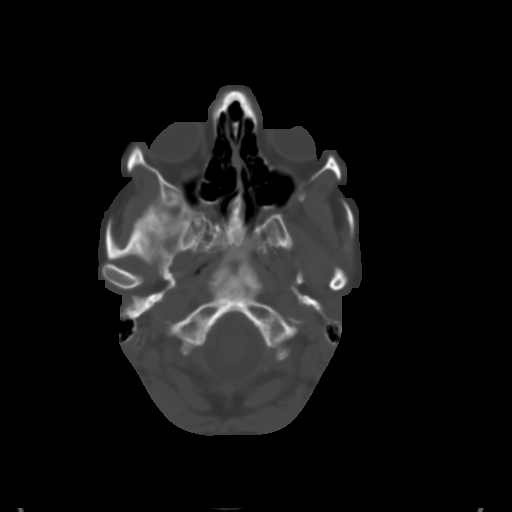
[im 4/32  brain]
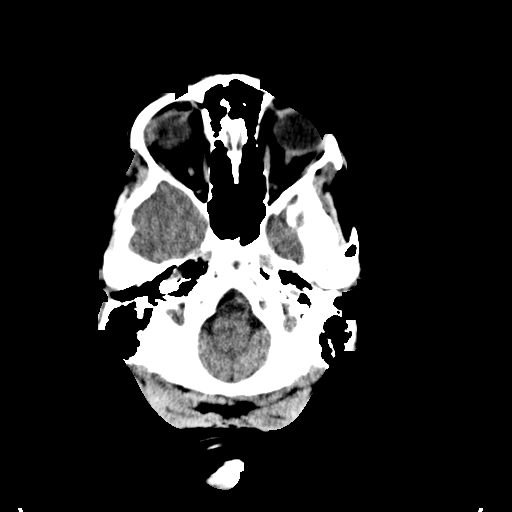
[im 6/32  brain]
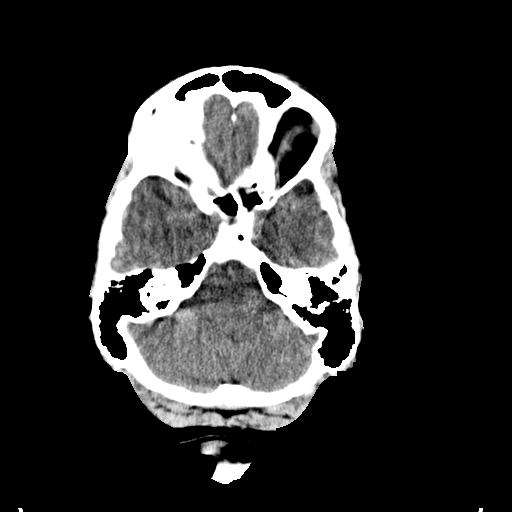
[im 8/32  brain]
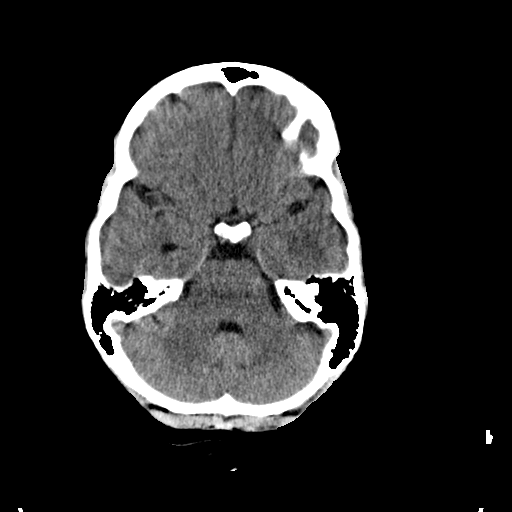
[im 10/32  brain]
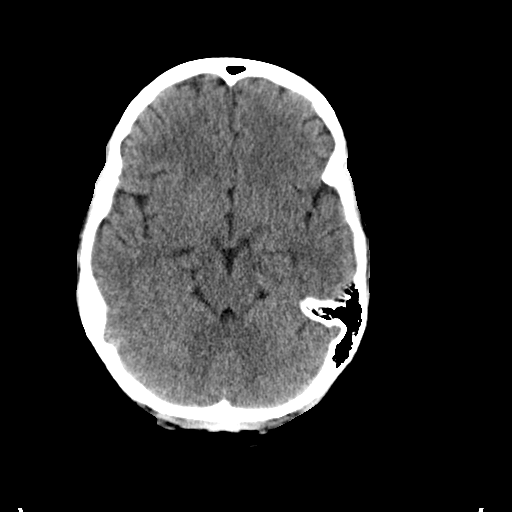
[im 10/32  bone]
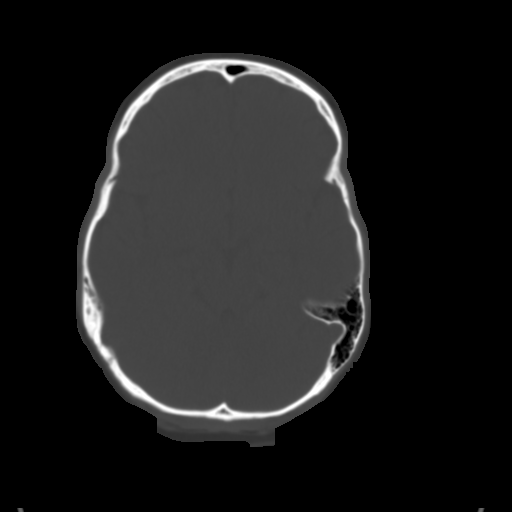
[im 12/32  brain]
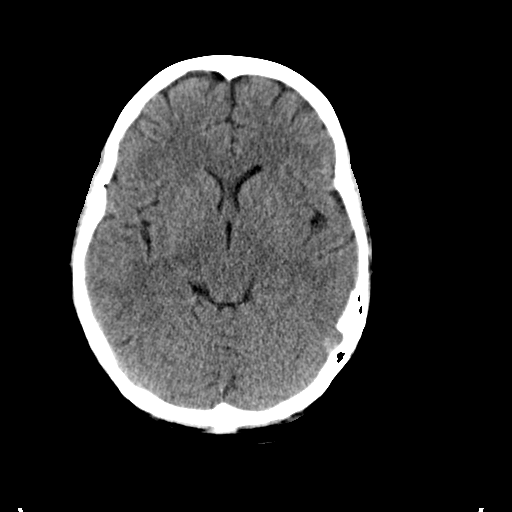
[im 14/32  brain]
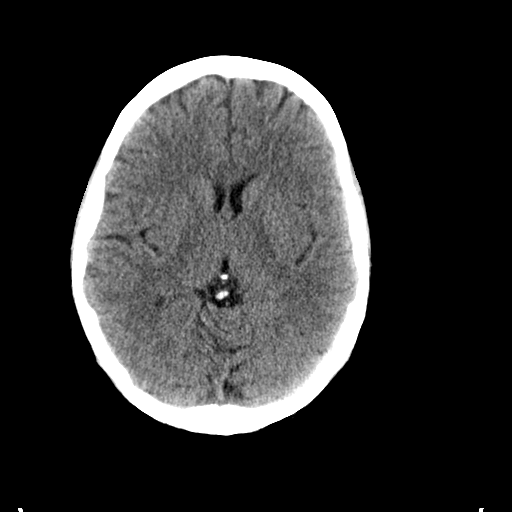
[im 17/32  brain]
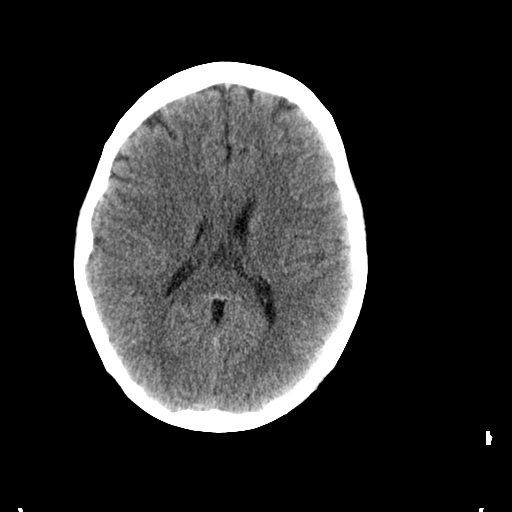
[im 18/32  brain]
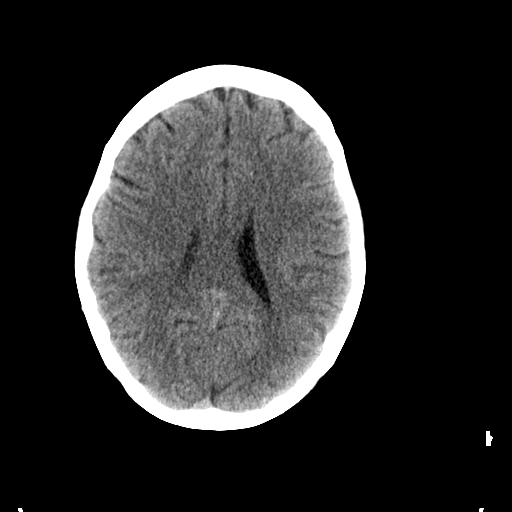
[im 18/32  bone]
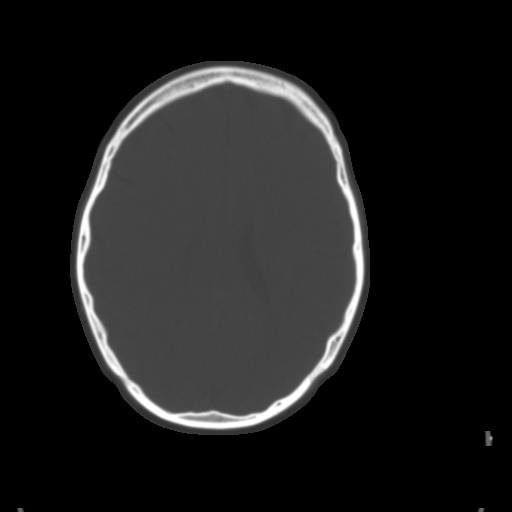
[im 20/32  brain]
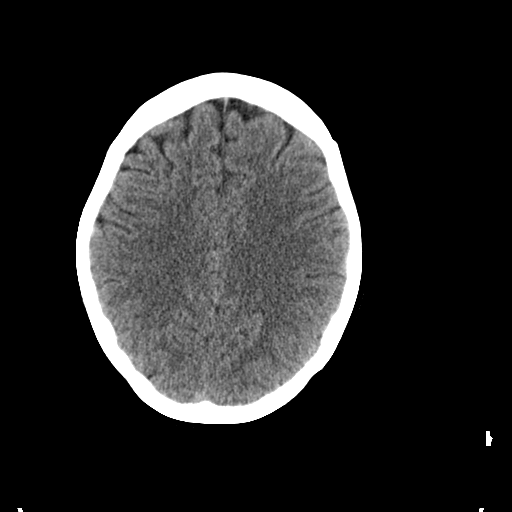
[im 22/32  brain]
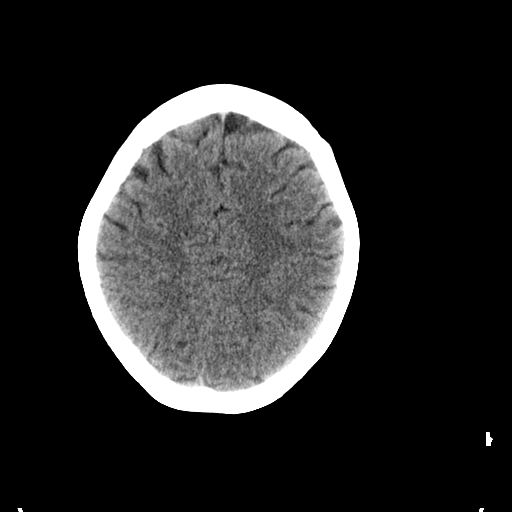
[im 24/32  brain]
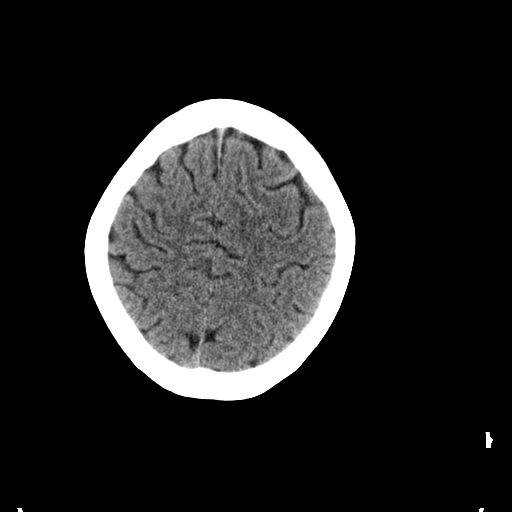
[im 26/32  brain]
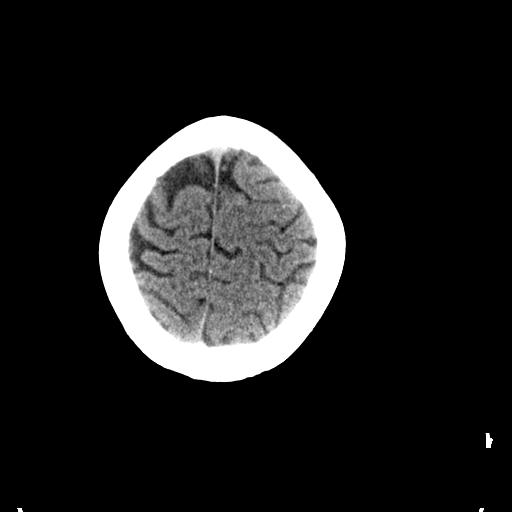
[im 26/32  bone]
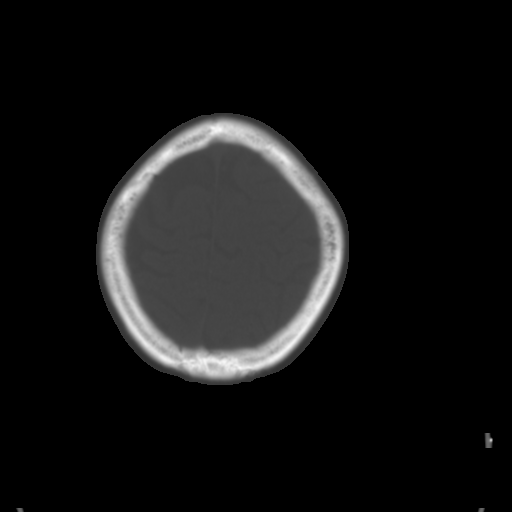
[im 28/32  brain]
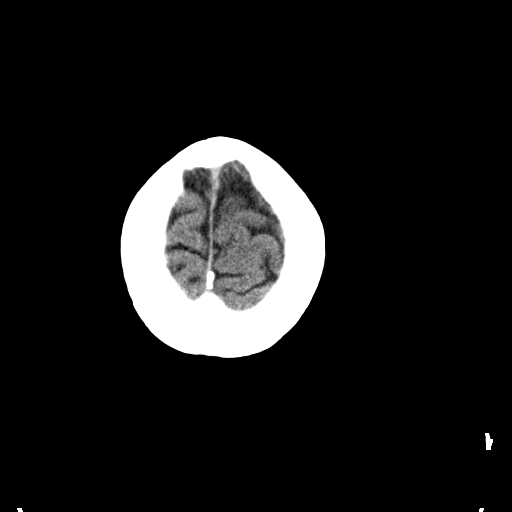
[im 30/32  brain]
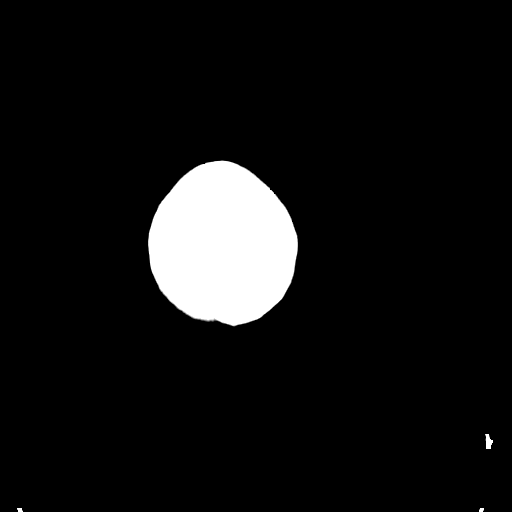

[15 of 30 positions shown; findings below may reference images not displayed]

FINDINGS: There is no evidence of acute infarction, mass lesion, or intra- or
extra-axial hemorrhage on CT.

The posterior fossa, including the cerebellum, brainstem and fourth
ventricle, is within normal limits. The third and lateral
ventricles, and basal ganglia are unremarkable in appearance. The
cerebral hemispheres are symmetric in appearance, with normal
gray-white differentiation. No mass effect or midline shift is seen.

There is no evidence of fracture; visualized osseous structures are
unremarkable in appearance. The visualized portions of the orbits
are within normal limits. The paranasal sinuses and mastoid air
cells are well-aerated. No significant soft tissue abnormalities are
seen.
IMPRESSION: No evidence of traumatic intracranial injury or fracture.

## 2017-01-31 IMAGING — CR DG FINGER LITTLE 2+V*L*
1 series · 4 of 4 positions shown · non-contrast
Comparison: None.

CLINICAL DATA: Status post fall in bathroom, with left fifth finger
pain. Initial encounter.

EXAM:
LEFT LITTLE FINGER 2+V

[Series 1: pa · 0.17mm/px · 4 of 4 slices shown]
[im 1/4]
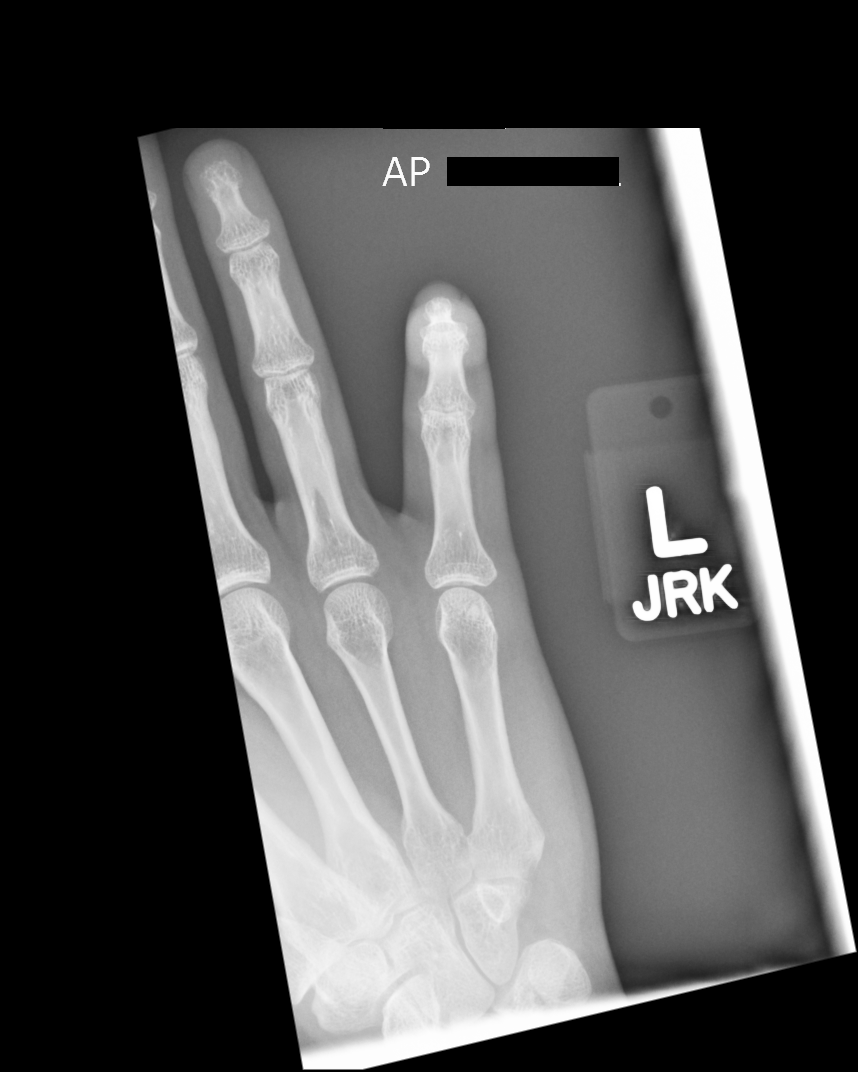
[im 2/4]
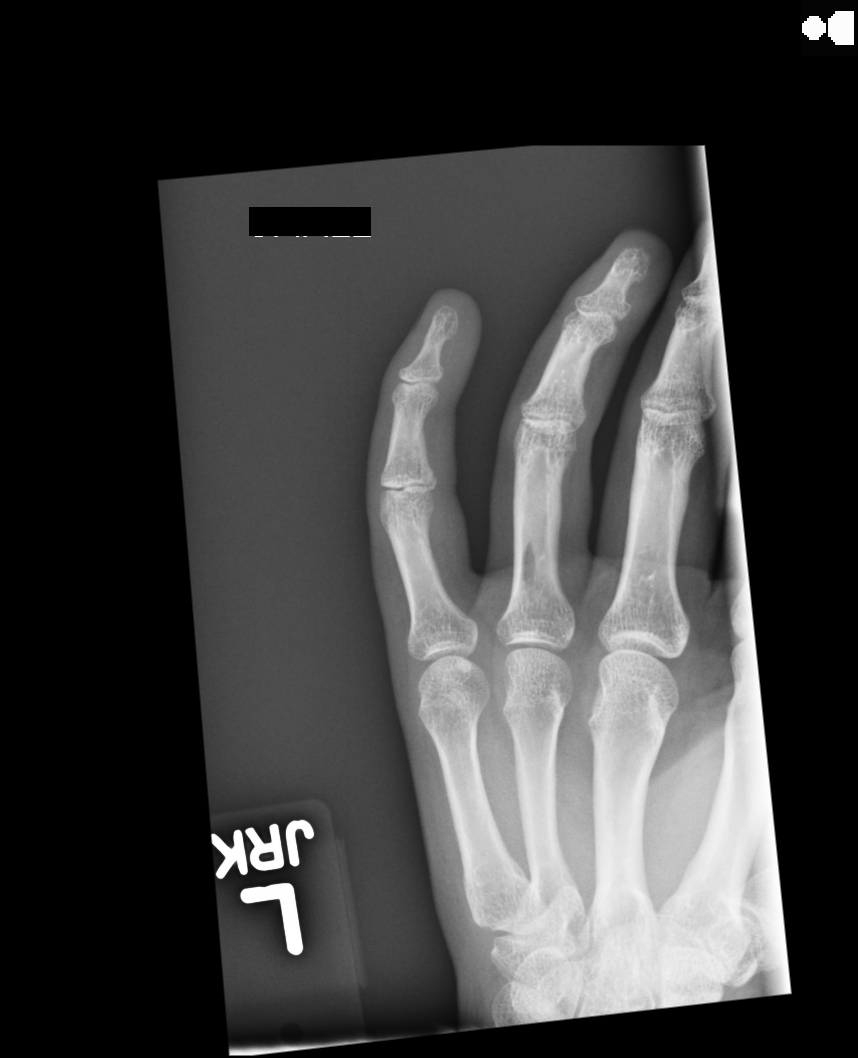
[im 3/4]
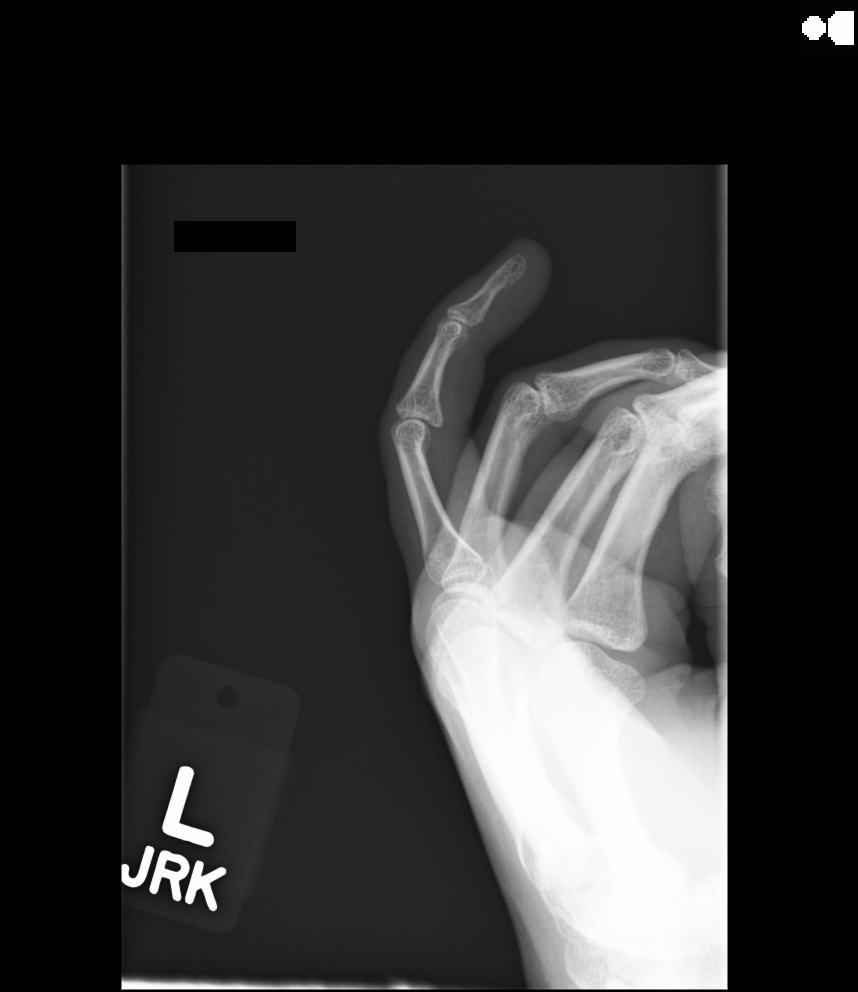
[im 4/4]
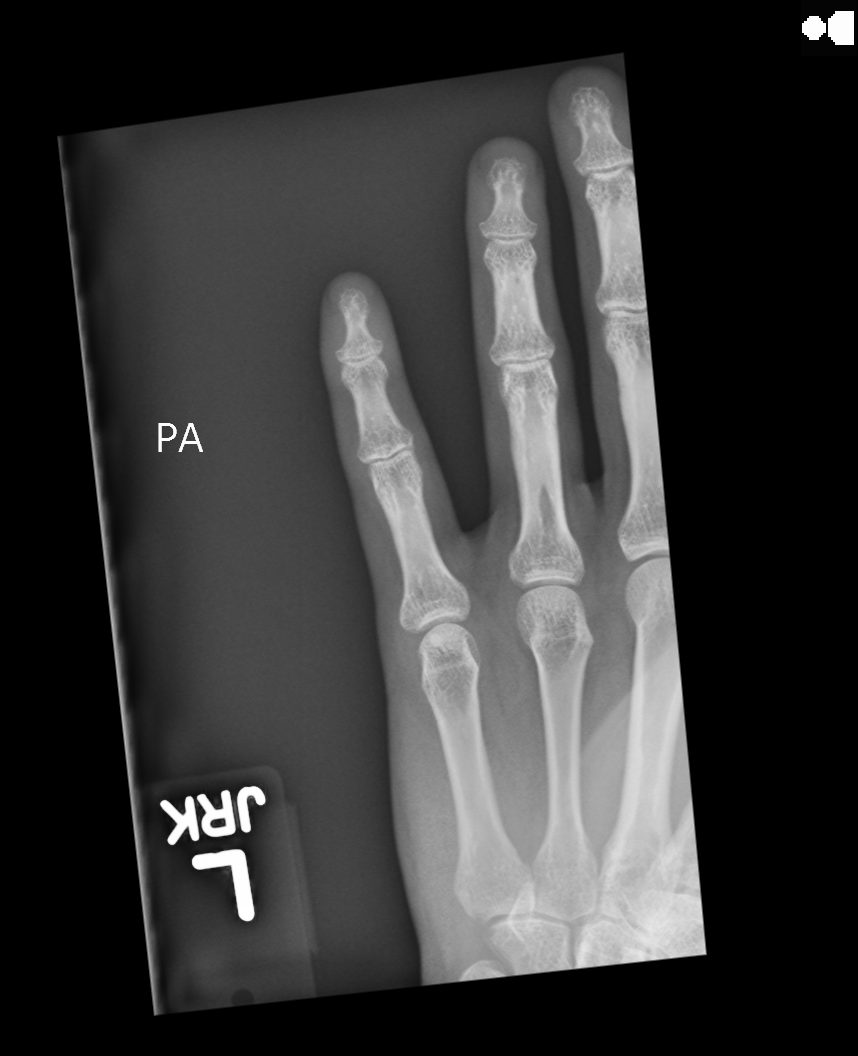

[4 of 4 positions shown; findings below may reference images not displayed]

FINDINGS: There is suggestion of a a minimally displaced fracture involving
the volar ulnar aspect of the base of the fifth middle phalanx. This
extends to the proximal interphalangeal joint space. No additional
fractures are seen. No significant soft tissue abnormalities are
characterized on radiograph.
IMPRESSION: Suggestion of minimally displaced fracture involving the volar ulnar
aspect of the base of the fifth middle phalanx, extending to the
proximal interphalangeal joint space.

## 2017-02-06 ENCOUNTER — Other Ambulatory Visit: Payer: Self-pay

## 2017-02-06 ENCOUNTER — Emergency Department (HOSPITAL_COMMUNITY)
Admission: EM | Admit: 2017-02-06 | Discharge: 2017-02-06 | Disposition: A | Discharge status: Home / Self Care | Payer: Medicare Other | Attending: Emergency Medicine | Admitting: Emergency Medicine

## 2017-02-06 ENCOUNTER — Encounter (HOSPITAL_COMMUNITY): Payer: Self-pay | Admitting: *Deleted

## 2017-02-06 ENCOUNTER — Emergency Department (HOSPITAL_COMMUNITY): Payer: Medicare Other

## 2017-02-06 DIAGNOSIS — Z79899 Other long term (current) drug therapy: Secondary | ICD-10-CM | POA: Insufficient documentation

## 2017-02-06 DIAGNOSIS — R05 Cough: Secondary | ICD-10-CM | POA: Diagnosis present

## 2017-02-06 DIAGNOSIS — J209 Acute bronchitis, unspecified: Secondary | ICD-10-CM | POA: Diagnosis not present

## 2017-02-06 DIAGNOSIS — F17210 Nicotine dependence, cigarettes, uncomplicated: Secondary | ICD-10-CM | POA: Insufficient documentation

## 2017-02-06 DIAGNOSIS — Z91040 Latex allergy status: Secondary | ICD-10-CM | POA: Diagnosis not present

## 2017-02-06 MED ORDER — ALBUTEROL SULFATE HFA 108 (90 BASE) MCG/ACT IN AERS
2.0000 | INHALATION_SPRAY | Freq: Four times a day (QID) | RESPIRATORY_TRACT | Status: DC
  Administered 2017-02-06: 2 via RESPIRATORY_TRACT
  Filled 2017-02-06: qty 6.7

## 2017-02-06 MED ORDER — DM-GUAIFENESIN ER 30-600 MG PO TB12: 1.0000 | ORAL_TABLET | Freq: Two times a day (BID) | ORAL | 1 refills | Status: DC

## 2017-02-06 NOTE — ED Notes (Signed)
RT notified regarding inhaler administration. Will discharge pt post inhaler treatment.

## 2017-02-06 NOTE — ED Triage Notes (Signed)
Pt brought in by cough x one week; pt has taken otc meds with no relief

## 2017-02-06 NOTE — ED Provider Notes (Signed)
[facility] Provider Note   CSN: [id number] Arrival date & time: 02/06/17  0500     History   Chief Complaint Chief Complaint  Patient presents with  . Cough    HPI Patricia Irwin is a 26 y.o. female.  Patient with a history of seizures and pseudoseizures.  Patient had upper respiratory type symptoms more recently cough that has been persistent.  Been taking over-the-counter NyQuil without complete relief.  Patient been having coughing episode during the night had a syncopal episode and then also had seizures.  This prompted them to call EMS and bring patient in.  Upon arrival here patient very stable.  Patient now feeling much better not really concerned about passing out in the seizure.  Patient has some chest pain with coughing.  No injuries from the seizure.      Past Medical History:  Diagnosis Date  . Anxiety   . Chronic headache   . Chronic knee pain   . Hx of electroencephalogram 12/2013 ([facility])   normal, dx non-epileptic seizures  . Hypoglycemia   . Noncompliance with medications   . Pseudoseizures   . PTSD (post-traumatic stress disorder)   . Seizures (HCC)   . Vaginal Pap smear, abnormal     Patient Active Problem List   Diagnosis Date Noted  . History of seizures 11/10/2016  . Irregular periods 11/10/2016  . History of posttraumatic stress disorder (PTSD) 11/10/2016  . Patient desires pregnancy 06/02/2016  . History of ovarian cyst 06/02/2016  . Missed periods 06/02/2016    Past Surgical History:  Procedure Laterality Date  . WISDOM TOOTH EXTRACTION      OB History    Gravida Para Term Preterm AB Living   0             SAB TAB Ectopic Multiple Live Births                   Home Medications    Prior to Admission medications   Medication Sig Start Date End Date Taking? Authorizing Provider  Cariprazine HCl (VRAYLAR) 3 MG CAPS Take 3 mg by mouth at bedtime.    [provider]  clomiPHENE (CLOMID) 50 MG tablet  Take 1 tablet (50 mg total) by mouth daily. Days 3-7 of cycle 11/19/16   Tilda BurrowFerguson, John V, MD  clomiPHENE (CLOMID) 50 MG tablet TAKE 1 TABLET BY MOUTH DAILY ON DAYS 3-7 OF CYCLE. 01/22/17   Tilda BurrowFerguson, John V, MD  clonazePAM (KLONOPIN) 1 MG tablet Take 1 mg by mouth 3 (three) times daily.    [provider]  dextromethorphan-guaiFENesin (MUCINEX DM) 30-600 MG 12hr tablet Take 1 tablet by mouth 2 (two) times daily. 02/06/17   Vanetta MuldersZackowski, Karmine Kauer, MD  escitalopram (LEXAPRO) 10 MG tablet Take 10 mg by mouth daily.    [provider]  levETIRAcetam (KEPPRA XR) 500 MG 24 hr tablet Take 2 tablets (1,000 mg total) by mouth daily. Patient taking differently: Take 2,000 mg by mouth daily.  04/07/13   Triplett, Tammy, PA-C  propranolol (INDERAL) 20 MG tablet Take 20 mg by mouth daily as needed (for high blood pressure).    [provider]    Family History Family History  Problem Relation Age of Onset  . Diabetes Father   . Heart disease Father   . Hypertension Father   . Diabetes Paternal Grandfather   . Hypertension Paternal Grandfather   . Diabetes Paternal Grandmother   . Hypertension Paternal Grandmother   .  Ovarian cancer Maternal Grandmother   . Diabetes Maternal Grandmother   . Diabetes Maternal Grandfather   . Bipolar disorder Mother   . Cancer Maternal Aunt        skin    Social History Social History   Tobacco Use  . Smoking status: Current Every Day Smoker    Packs/day: 0.25    Types: Cigarettes  . Smokeless tobacco: Never Used  . Tobacco comment: 3 a day  Substance Use Topics  . Alcohol use: No    Comment: occasional  . Drug use: No     Allergies   Dilantin [phenytoin]; Amoxicillin; Amoxicillin-pot clavulanate; Bee venom; Celexa [citalopram hydrobromide]; Cinnamon; Influenza vaccines; Metronidazole; Penicillins; Tape; Latex; and Trintellix [vortioxetine]   Review of Systems Review of Systems  Constitutional: Negative for fever.  HENT: Positive  for congestion.   Eyes: Negative for redness.  Respiratory: Positive for cough and shortness of breath.   Cardiovascular: Positive for chest pain.  Gastrointestinal: Positive for abdominal pain.  Genitourinary: Negative for dysuria.  Musculoskeletal: Negative for back pain and neck pain.  Skin: Negative for rash.  Neurological: Negative for headaches.  Hematological: Does not bruise/bleed easily.  Psychiatric/Behavioral: Negative for confusion.     Physical Exam Updated Vital Signs BP (!) 121/94 (BP Location: Left Arm)   Pulse 81   Temp 98.5 F (36.9 C) (Oral)   Resp 20   Ht 1.6 m (5\' 3" )   Wt 77.8 kg (171 lb 9.6 oz)   LMP 01/19/2017   SpO2 98%   BMI 30.40 kg/m   Physical Exam  Constitutional: She is oriented to person, place, and time. She appears well-developed and well-nourished. No distress.  HENT:  Head: Normocephalic and atraumatic.  Mouth/Throat: Oropharynx is clear and moist. No oropharyngeal exudate.  Posterior pharynx with some erythema no exudate no significant swelling uvula midline  Eyes: Conjunctivae and EOM are normal. Pupils are equal, round, and reactive to light.  Neck: Normal range of motion. Neck supple.  Cardiovascular: Normal rate, regular rhythm and normal heart sounds.  Pulmonary/Chest: Effort normal and breath sounds normal. No respiratory distress.  Abdominal: Soft. Bowel sounds are normal. There is no tenderness.  Musculoskeletal: Normal range of motion. She exhibits no edema.  Neurological: She is alert and oriented to person, place, and time. No cranial nerve deficit or sensory deficit. She exhibits normal muscle tone. Coordination normal.  Skin: Skin is warm. No rash noted.  Nursing note and vitals reviewed.    ED Treatments / Results  Labs (all labs ordered are listed, but only abnormal results are displayed) Labs Reviewed - No data to display  EKG  EKG Interpretation None       Radiology Dg Chest 2 View  Result Date:  02/06/2017 CLINICAL DATA:  Acute onset of cough and congestion. Mid chest pain. EXAM: CHEST  2 VIEW COMPARISON:  Chest radiograph performed 12/26/2015 FINDINGS: The lungs are well-aerated and clear. There is no evidence of focal opacification, pleural effusion or pneumothorax. The heart is normal in size; the mediastinal contour is within normal limits. No acute osseous abnormalities are seen. IMPRESSION: No acute cardiopulmonary process seen. Electronically Signed   By: Roanna Raider M.D.   On: 02/06/2017 06:48    Procedures Procedures (including critical care time)  Medications Ordered in ED Medications  albuterol (PROVENTIL HFA;VENTOLIN HFA) 108 (90 Base) MCG/ACT inhaler 2 puff (not administered)     Initial Impression / Assessment and Plan / ED Course  I have reviewed the triage  vital signs and the nursing notes.  Pertinent labs & imaging results that were available during my care of the patient were reviewed by me and considered in my medical decision making (see chart for details).     Patient nontoxic no acute distress.  Chest x-ray negative for pneumonia.  Symptoms consistent with an acute bronchitis following upper respiratory infection.  Patient has a history of seizures.  Patient is to return back to baseline.  No injuries from the seizure.  Will treat with albuterol inhaler and Mucinex DM.  Final Clinical Impressions(s) / ED Diagnoses   Final diagnoses:  Acute bronchitis, unspecified organism    ED Discharge Orders        Ordered    dextromethorphan-guaiFENesin [facility](MUCINEX DM) 30-600 MG 12hr tablet  2 times daily     02/06/17 0820       Vanetta MuldersZackowski, Elize Pinon, MD 02/06/17 [phone]

## 2017-02-06 NOTE — Discharge Instructions (Signed)
Use the albuterol inhaler as directed.  Take the Mucinex as directed.  Chest x-ray negative for pneumonia.  Return for any new or worse symptoms.

## 2017-04-07 IMAGING — CT CT RENAL STONE PROTOCOL
3 of 4 series · 10 of 46 positions shown, 17 images · non-contrast
Comparison: None.

CLINICAL DATA: Right flank pain radiating into the right lower
quadrant.

EXAM:
CT ABDOMEN AND PELVIS WITHOUT CONTRAST
TECHNIQUE: Multidetector CT imaging of the abdomen and pelvis was performed
following the standard protocol without IV contrast.

[Series 3: mpr coronal (id) · coronal · 0.86mm/px · 3 of 72 slices shown, 4 images]
[im 24/72  soft-tissue]
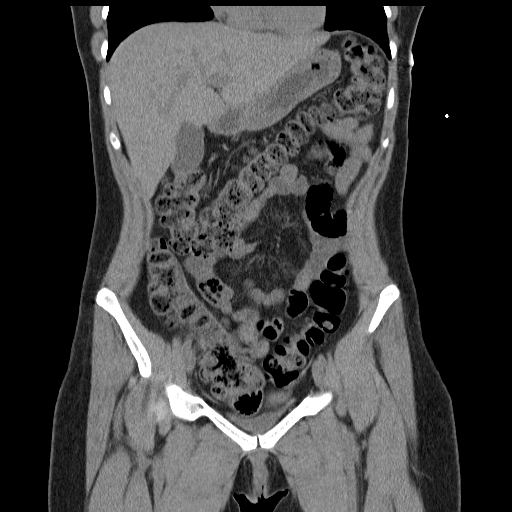
[im 32/72  soft-tissue]
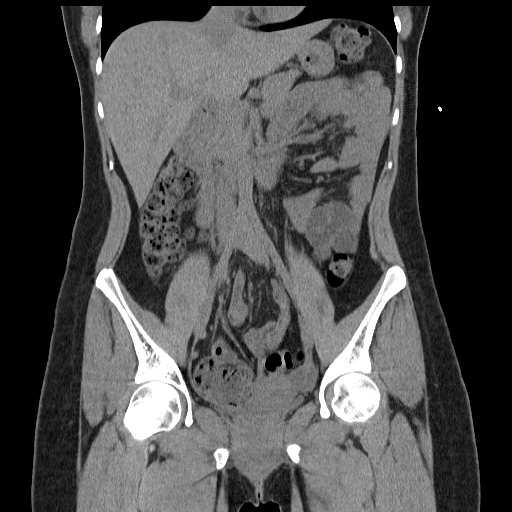
[im 32/72  bone]
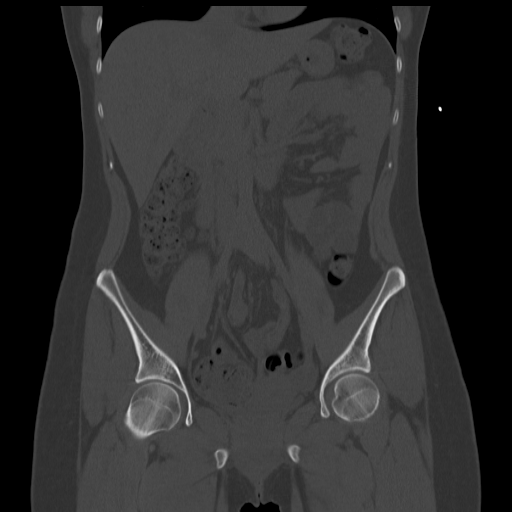
[im 40/72  soft-tissue]
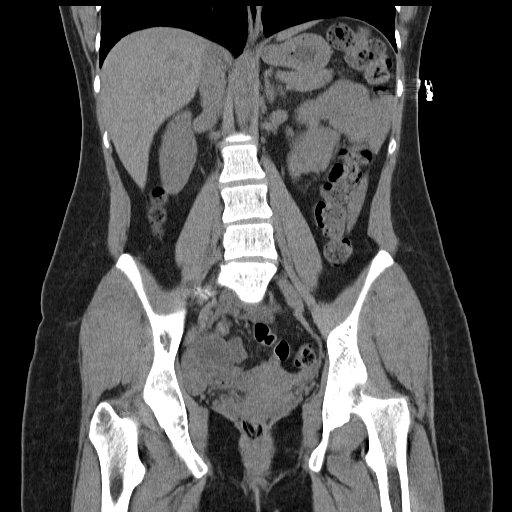

[Series 4: mpr sagittal (id) · sagittal · 0.53mm/px · 1 of 101 slices shown, 2 images]
[im 34/101  soft-tissue]
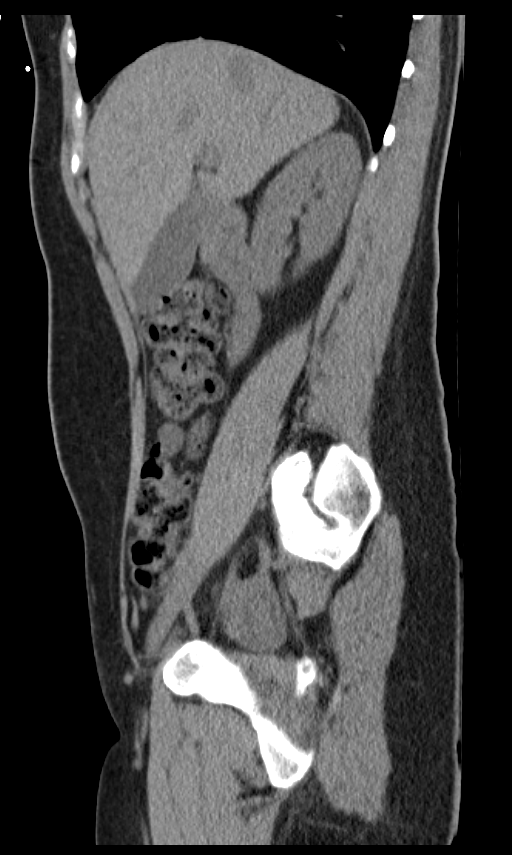
[im 34/101  bone]
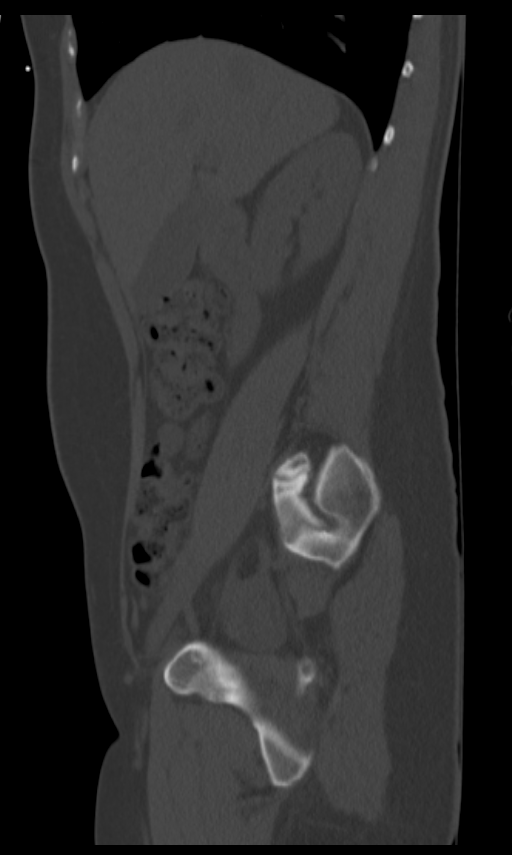

[Series 6: lung 5.0 b60f · axial · 0.68mm/px · z∈[-138,-38]mm · 6 of 29 slices shown, 11 images]
[im 5/29  soft-tissue]
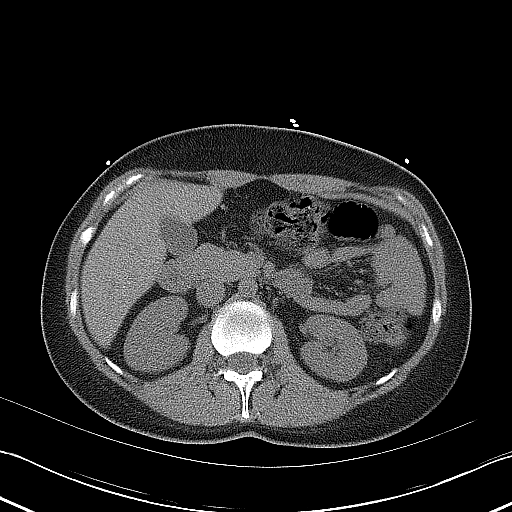
[im 5/29  bone]
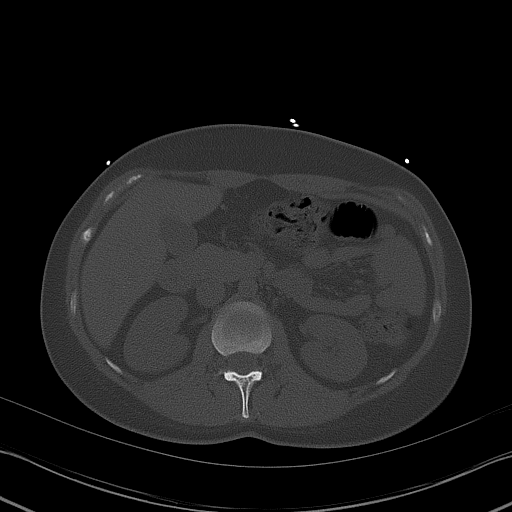
[im 9/29  soft-tissue]
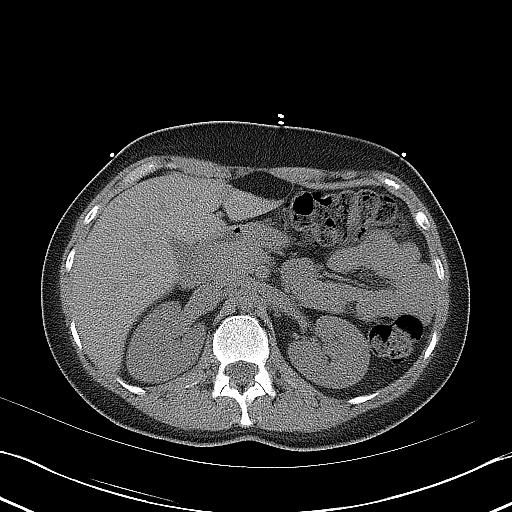
[im 13/29  soft-tissue]
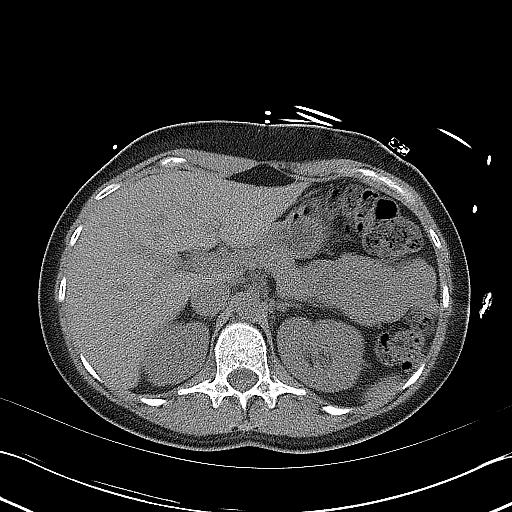
[im 13/29  lung]
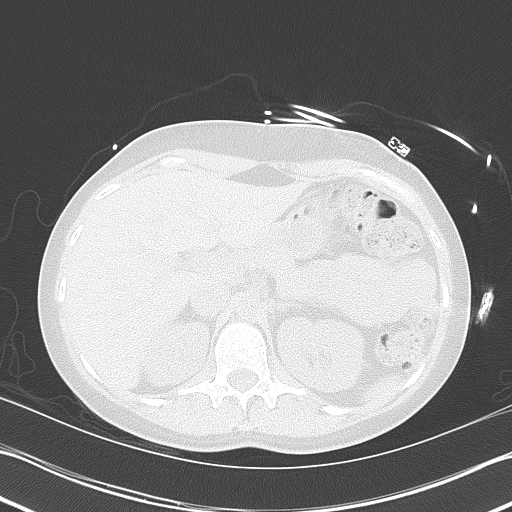
[im 17/29  soft-tissue]
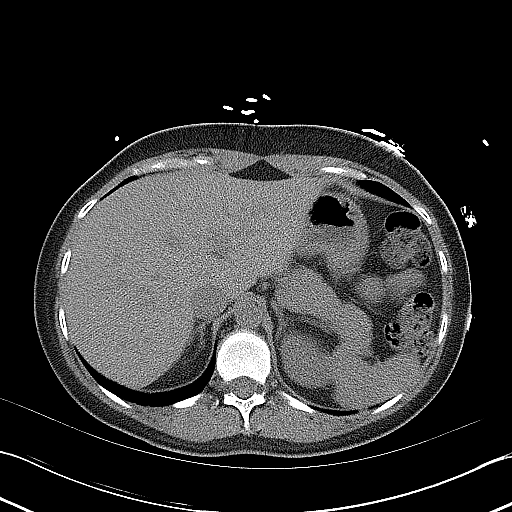
[im 17/29  lung]
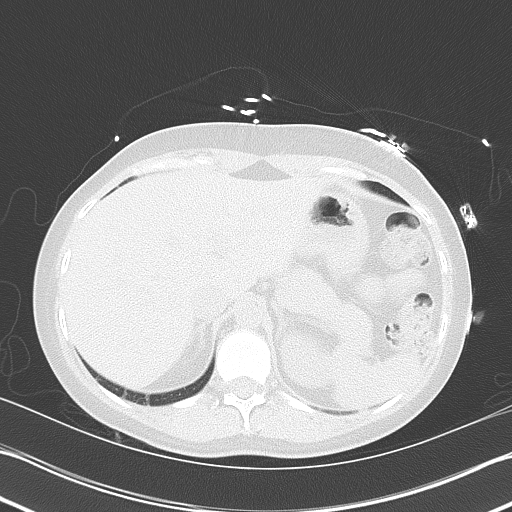
[im 21/29  soft-tissue]
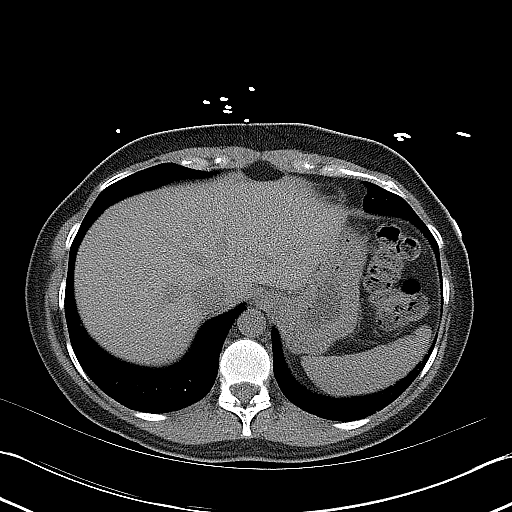
[im 21/29  lung]
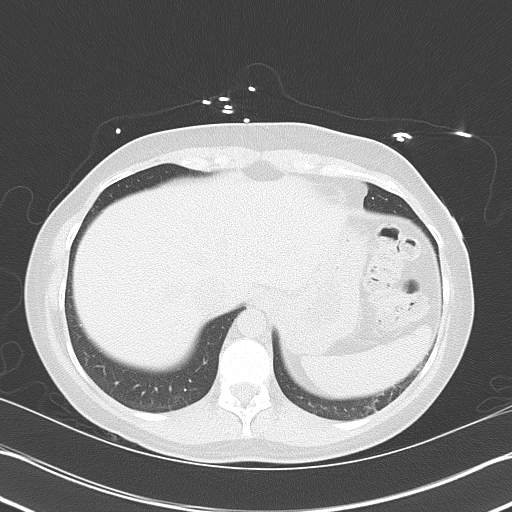
[im 25/29  soft-tissue]
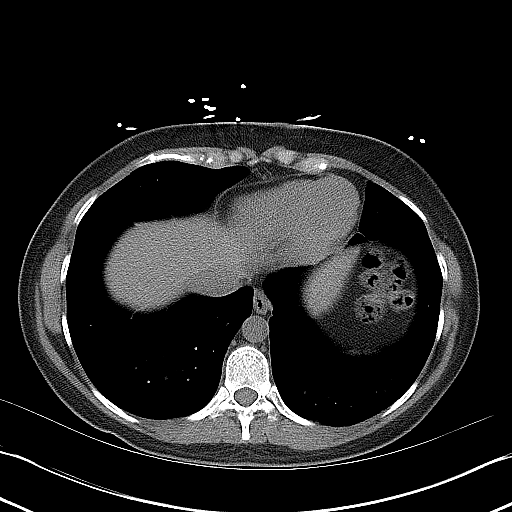
[im 25/29  lung]
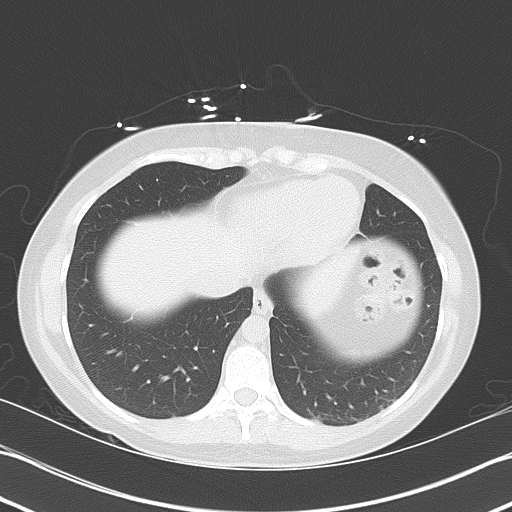

[10 of 46 positions shown; findings below may reference images not displayed]

FINDINGS: Lower chest: Trace dependent atelectasis in the left lower lobe. No
pleural effusion.

Liver: No focal lesion allowing for lack contrast.

Hepatobiliary: Gallbladder physiologically distended, no calcified
stone. No biliary dilatation.

Pancreas: No ductal dilatation or inflammation.

Spleen: Normal.

Adrenal glands: No nodule.

Kidneys: Symmetric in size without stones or hydronephrosis. There
is no perinephric stranding. Both ureters are decompressed without
stones along the course.

Stomach/Bowel: Stomach physiologically distended. There are no
dilated or thickened small bowel loops. Moderate diffuse volume of
stool throughout the colon without colonic wall thickening. The
appendix is normal.

Vascular/Lymphatic: No retroperitoneal adenopathy. Abdominal aorta
is normal in caliber.

Reproductive: Uterus is anteverted. Ovaries tentatively identified
and symmetric in size. Pelvic viscera evaluation limited by lack of
contrast.

Bladder: Completely decompressed.

Other: No free air, free fluid, or intra-abdominal fluid collection.

Musculoskeletal: There are no acute or suspicious osseous
abnormalities. Hemi transitional lumbosacral anatomy.
IMPRESSION: No acute abnormality in the abdomen/pelvis. No urolithiasis or
obstructive uropathy. Normal appendix.

## 2017-04-18 ENCOUNTER — Encounter (HOSPITAL_COMMUNITY): Payer: Self-pay

## 2017-04-18 ENCOUNTER — Other Ambulatory Visit: Payer: Self-pay

## 2017-04-18 ENCOUNTER — Emergency Department (HOSPITAL_COMMUNITY)
Admission: EM | Admit: 2017-04-18 | Discharge: 2017-04-18 | Disposition: A | Discharge status: Home / Self Care | Payer: Medicare Other | Attending: Emergency Medicine | Admitting: Emergency Medicine

## 2017-04-18 ENCOUNTER — Emergency Department (HOSPITAL_COMMUNITY): Payer: Medicare Other

## 2017-04-18 DIAGNOSIS — F17210 Nicotine dependence, cigarettes, uncomplicated: Secondary | ICD-10-CM | POA: Insufficient documentation

## 2017-04-18 DIAGNOSIS — G43009 Migraine without aura, not intractable, without status migrainosus: Secondary | ICD-10-CM | POA: Diagnosis not present

## 2017-04-18 DIAGNOSIS — Z79899 Other long term (current) drug therapy: Secondary | ICD-10-CM | POA: Insufficient documentation

## 2017-04-18 DIAGNOSIS — Z91040 Latex allergy status: Secondary | ICD-10-CM | POA: Insufficient documentation

## 2017-04-18 DIAGNOSIS — J40 Bronchitis, not specified as acute or chronic: Secondary | ICD-10-CM | POA: Insufficient documentation

## 2017-04-18 DIAGNOSIS — R51 Headache: Secondary | ICD-10-CM | POA: Diagnosis present

## 2017-04-18 MED ORDER — SODIUM CHLORIDE 0.9 % IV BOLUS (SEPSIS)
1000.0000 mL | Freq: Once | INTRAVENOUS | Status: AC
  Administered 2017-04-18: 1000 mL via INTRAVENOUS

## 2017-04-18 MED ORDER — DEXAMETHASONE SODIUM PHOSPHATE 10 MG/ML IJ SOLN
10.0000 mg | Freq: Once | INTRAMUSCULAR | Status: AC
  Administered 2017-04-18: 10 mg via INTRAVENOUS
  Filled 2017-04-18: qty 1

## 2017-04-18 MED ORDER — DIPHENHYDRAMINE HCL 50 MG/ML IJ SOLN
25.0000 mg | Freq: Once | INTRAMUSCULAR | Status: AC
  Administered 2017-04-18: 25 mg via INTRAVENOUS
  Filled 2017-04-18: qty 1

## 2017-04-18 MED ORDER — METOCLOPRAMIDE HCL 5 MG/ML IJ SOLN
10.0000 mg | Freq: Once | INTRAMUSCULAR | Status: AC
  Administered 2017-04-18: 10 mg via INTRAVENOUS
  Filled 2017-04-18: qty 2

## 2017-04-18 MED ORDER — SODIUM CHLORIDE 0.9 % IV BOLUS (SEPSIS)
1000.0000 mL | Freq: Once | INTRAVENOUS | Status: AC
Start: 2017-04-18 — End: 2017-04-18
  Administered 2017-04-18: 1000 mL via INTRAVENOUS

## 2017-04-18 MED ORDER — PREDNISONE 20 MG PO TABS: ORAL_TABLET | ORAL | 0 refills | Status: DC

## 2017-04-18 NOTE — ED Notes (Signed)
Patient transported to X-ray 

## 2017-04-18 NOTE — ED Provider Notes (Signed)
[facility] EMERGENCY DEPARTMENT Provider Note   CSN: [id number] Arrival date & time: 04/18/17  4098  Time seen 04:08 AM   History   Chief Complaint Chief Complaint  Patient presents with  . Migraine    HPI Patricia Irwin is a 27 y.o. female.  HPI   patient states she has a history of migraine headaches.  She states about 1-1/2-2 hours ago while laying down to go to sleep in a dark she started having pain behind her left eye which is usually a sign she is going to have a headache.  She took 1 Tylenol 325 mg which usually helps her headaches.  She states then it started spreading towards her left jaw, down into her left neck, and into her left shoulder.  She states the headache is sharp and constant.  She states moving her head does not make the pain worse however bright lights and sounds make it worse.  She states nothing makes it feel better except taking her mind off of it.  She had some nausea without vomiting.  She states her legs feel weak.  She states she has bilateral blurred vision, she denies numbness of her legs but states her legs feel tingly.  She states the last time she had headaches like this was when she was 16.  She states it will last for several days.  She states it will last even if she goes to the hospital for treatment.  Patient also is complaining of cough for 3 months.  She states she had fever to 103.  Patient was seen in December for a negative chest x-ray.  She states she has been treated for bronchitis without improvement.  PCP [facility]   Past Medical History:  Diagnosis Date  . Anxiety   . Chronic headache   . Chronic knee pain   . Hx of electroencephalogram 12/2013 ([facility])   normal, dx non-epileptic seizures  . Hypoglycemia   . Noncompliance with medications   . Pseudoseizures   . PTSD (post-traumatic stress disorder)   . Seizures (HCC)   . Vaginal Pap smear, abnormal     Patient Active Problem List   Diagnosis Date  Noted  . History of seizures 11/10/2016  . Irregular periods 11/10/2016  . History of posttraumatic stress disorder (PTSD) 11/10/2016  . Patient desires pregnancy 06/02/2016  . History of ovarian cyst 06/02/2016  . Missed periods 06/02/2016    Past Surgical History:  Procedure Laterality Date  . WISDOM TOOTH EXTRACTION      OB History    Gravida Para Term Preterm AB Living   0             SAB TAB Ectopic Multiple Live Births                   Home Medications    Prior to Admission medications   Medication Sig Start Date End Date Taking? Authorizing Provider  clomiPHENE (CLOMID) 50 MG tablet Take 1 tablet (50 mg total) by mouth daily. Days 3-7 of cycle 11/19/16  Yes Tilda Burrow, MD  clomiPHENE (CLOMID) 50 MG tablet TAKE 1 TABLET BY MOUTH DAILY ON DAYS 3-7 OF CYCLE. 01/22/17  Yes Tilda Burrow, MD  clonazePAM (KLONOPIN) 1 MG tablet Take 1 mg by mouth 3 (three) times daily.   Yes [provider]  escitalopram (LEXAPRO) 10 MG tablet Take 10 mg by mouth daily.   Yes [provider]  levETIRAcetam (  KEPPRA XR) 500 MG 24 hr tablet Take 2 tablets (1,000 mg total) by mouth daily. Patient taking differently: Take 2,000 mg by mouth daily.  04/07/13  Yes Triplett, Tammy, PA-C  Cariprazine HCl (VRAYLAR) 3 MG CAPS Take 3 mg by mouth at bedtime.    [provider]  dextromethorphan-guaiFENesin (MUCINEX DM) 30-600 MG 12hr tablet Take 1 tablet by mouth 2 (two) times daily. 02/06/17   Vanetta Mulders, MD  predniSONE (DELTASONE) 20 MG tablet Take 3 po QD x 3d , then 2 po QD x 3d then 1 po QD x 3d 04/18/17   [name], MD  propranolol (INDERAL) 20 MG tablet Take 20 mg by mouth daily as needed (for high blood pressure).    [provider]    Family History Family History  Problem Relation Age of Onset  . Diabetes Father   . Heart disease Father   . Hypertension Father   . Diabetes Paternal Grandfather   . Hypertension Paternal Grandfather   .  Diabetes Paternal Grandmother   . Hypertension Paternal Grandmother   . Ovarian cancer Maternal Grandmother   . Diabetes Maternal Grandmother   . Diabetes Maternal Grandfather   . Bipolar disorder Mother   . Cancer Maternal Aunt        skin    Social History Social History   Tobacco Use  . Smoking status: Current Every Day Smoker    Packs/day: 0.25    Types: Cigarettes  . Smokeless tobacco: Never Used  . Tobacco comment: 3 a day  Substance Use Topics  . Alcohol use: No    Comment: occasional  . Drug use: No  on disability for PTSD, Bipolar, psychogenic seizures   Allergies   Dilantin [phenytoin]; Amoxicillin; Amoxicillin-pot clavulanate; Bee venom; Celexa [citalopram hydrobromide]; Cinnamon; Influenza vaccines; Metronidazole; Penicillins; Tape; Latex; and Trintellix [vortioxetine]   Review of Systems Review of Systems  All other systems reviewed and are negative.    Physical Exam Updated Vital Signs BP (!) 134/97 (BP Location: Left Arm)   Pulse 88   Temp 97.8 F (36.6 C) (Oral)   Resp 18   Ht 5\' 3"  (1.6 m)   Wt 82.1 kg (181 lb)   LMP 03/15/2017 (Exact Date)   SpO2 99%   BMI 32.06 kg/m   Vital signs normal    Physical Exam  Constitutional: She is oriented to person, place, and time. She appears well-developed and well-nourished.  Non-toxic appearance. She does not appear ill. No distress.  When I enter the room patient is calmly sitting up in her stretcher with her eyes open talking to her husband on the phone in no distress.  HENT:  Head: Normocephalic and atraumatic.  Right Ear: External ear normal.  Left Ear: External ear normal.  Nose: Nose normal. No mucosal edema or rhinorrhea.  Mouth/Throat: Oropharynx is clear and moist and mucous membranes are normal. No dental abscesses or uvula swelling.  Eyes: Conjunctivae and EOM are normal. Pupils are equal, round, and reactive to light.  Neck: Normal range of motion and full passive range of motion without  pain. Neck supple.  Cardiovascular: Normal rate, regular rhythm and normal heart sounds. Exam reveals no gallop and no friction rub.  No murmur heard. Pulmonary/Chest: Effort normal and breath sounds normal. No respiratory distress. She has no wheezes. She has no rhonchi. She has no rales. She exhibits no tenderness and no crepitus.  Abdominal: Soft. Normal appearance and bowel sounds are normal. She exhibits no distension. There is  no tenderness. There is no rebound and no guarding.  Musculoskeletal: Normal range of motion. She exhibits no edema or tenderness.  Moves all extremities well.   Neurological: She is alert and oriented to person, place, and time. She has normal strength. No cranial nerve deficit.  Skin: Skin is warm, dry and intact. No rash noted. No erythema. No pallor.  Psychiatric: She has a normal mood and affect. Her speech is normal and behavior is normal. Her mood appears not anxious.  Nursing note and vitals reviewed.    ED Treatments / Results  Labs (all labs ordered are listed, but only abnormal results are displayed) Labs Reviewed - No data to display  EKG  EKG Interpretation None       Radiology Dg Chest 2 View  Result Date: 04/18/2017 CLINICAL DATA:  Severe headache.  Persistent bronchitis. EXAM: CHEST  2 VIEW COMPARISON:  Chest radiograph February 06, 2017 FINDINGS: Cardiomediastinal silhouette is normal. No pleural effusions or focal consolidations. Trachea projects midline and there is no pneumothorax. Soft tissue planes and included osseous structures are non-suspicious. IMPRESSION: Negative. Electronically Signed   By: Awilda Metroourtnay  Bloomer M.D.   On: 04/18/2017 05:09    Procedures Procedures (including critical care time)  Medications Ordered in ED Medications  sodium chloride 0.9 % bolus 1,000 mL (1,000 mLs Intravenous New Bag/Given 04/18/17 0435)  sodium chloride 0.9 % bolus 1,000 mL (1,000 mLs Intravenous New Bag/Given 04/18/17 0435)  metoCLOPramide  (REGLAN) injection 10 mg (10 mg Intravenous Given 04/18/17 0435)  diphenhydrAMINE (BENADRYL) injection 25 mg (25 mg Intravenous Given 04/18/17 0436)  dexamethasone (DECADRON) injection 10 mg (10 mg Intravenous Given 04/18/17 0436)     Initial Impression / Assessment and Plan / ED Course  I have reviewed the triage vital signs and the nursing notes.  Pertinent labs & imaging results that were available during my care of the patient were reviewed by me and considered in my medical decision making (see chart for details).     Patient was given IV fluids and migraine cocktail.  Chest x-ray was done due to her complaints of cough and "fever to 103".  Recheck at 5:50 AM patient states her headache is gone, she is having urinary output.  She feels ready to be discharged.  Her chest x-ray did not show any pneumonia.  She was discharged home with prednisone to see if that would calm down her chronic bronchitis.  Final Clinical Impressions(s) / ED Diagnoses   Final diagnoses:  Migraine without aura and without status migrainosus, not intractable  Bronchitis    ED Discharge Orders        Ordered    predniSONE (DELTASONE) 20 MG tablet     04/18/17 0555      Plan discharge  Devoria AlbeIva Allyssa Abruzzese, MD, Concha PyoFACEP    Tambra Muller, MD 04/18/17 [phone]

## 2017-04-18 NOTE — Discharge Instructions (Signed)
Drink plenty of fluids. Look at the information about preventing migraine headaches. Take the prednisone for your bronchitis. You can take mucinex DM OTC (get the generic) for your cough. TRY TO STOP SMOKING!!!

## 2017-04-18 NOTE — ED Notes (Signed)
Pt returned from X Ray.

## 2017-04-18 NOTE — ED Triage Notes (Signed)
Pt arrives via [facility] from home. Pt c/o severe headache ( 10 out of 10 on 0-10 scale). Pt states she was treated for bronchitis recently and feels it never went away. Pt presents with a cough and states her legs feel heavy.

## 2017-05-03 ENCOUNTER — Encounter (HOSPITAL_COMMUNITY): Payer: Self-pay | Admitting: Emergency Medicine

## 2017-05-03 ENCOUNTER — Emergency Department (HOSPITAL_COMMUNITY)
Admission: EM | Admit: 2017-05-03 | Discharge: 2017-05-03 | Disposition: A | Discharge status: Home / Self Care | Payer: Medicare Other | Attending: Emergency Medicine | Admitting: Emergency Medicine

## 2017-05-03 DIAGNOSIS — R569 Unspecified convulsions: Secondary | ICD-10-CM | POA: Diagnosis not present

## 2017-05-03 DIAGNOSIS — Z79899 Other long term (current) drug therapy: Secondary | ICD-10-CM | POA: Diagnosis not present

## 2017-05-03 DIAGNOSIS — Z91040 Latex allergy status: Secondary | ICD-10-CM | POA: Insufficient documentation

## 2017-05-03 DIAGNOSIS — F17210 Nicotine dependence, cigarettes, uncomplicated: Secondary | ICD-10-CM | POA: Diagnosis not present

## 2017-05-03 DIAGNOSIS — R51 Headache: Secondary | ICD-10-CM | POA: Insufficient documentation

## 2017-05-03 LAB — BASIC METABOLIC PANEL
ANION GAP: 7 (ref 5–15)
BUN: 7 mg/dL (ref 6–20)
CO2: 24 mmol/L (ref 22–32)
Calcium: 9 mg/dL (ref 8.9–10.3)
Chloride: 105 mmol/L (ref 101–111)
Creatinine, Ser: 0.63 mg/dL (ref 0.44–1.00)
Glucose, Bld: 112 mg/dL — ABNORMAL HIGH (ref 65–99)
Potassium: 3.9 mmol/L (ref 3.5–5.1)
Sodium: 136 mmol/L (ref 135–145)

## 2017-05-03 LAB — CBC WITH DIFFERENTIAL/PLATELET
BASOS ABS: 0 10*3/uL (ref 0.0–0.1)
BASOS PCT: 0 %
EOS ABS: 0.1 10*3/uL (ref 0.0–0.7)
Eosinophils Relative: 1 %
HEMATOCRIT: 40.5 % (ref 36.0–46.0)
HEMOGLOBIN: 12.9 g/dL (ref 12.0–15.0)
Lymphocytes Relative: 31 %
Lymphs Abs: 3.1 10*3/uL (ref 0.7–4.0)
MCH: 27.9 pg (ref 26.0–34.0)
MCHC: 31.9 g/dL (ref 30.0–36.0)
MCV: 87.7 fL (ref 78.0–100.0)
MONO ABS: 0.7 10*3/uL (ref 0.1–1.0)
Monocytes Relative: 7 %
NEUTROS ABS: 6 10*3/uL (ref 1.7–7.7)
NEUTROS PCT: 61 %
Platelets: 342 10*3/uL (ref 150–400)
RBC: 4.62 MIL/uL (ref 3.87–5.11)
RDW: 13.7 % (ref 11.5–15.5)
WBC: 10 10*3/uL (ref 4.0–10.5)

## 2017-05-03 LAB — I-STAT BETA HCG BLOOD, ED (MC, WL, AP ONLY)

## 2017-05-03 MED ORDER — KETOROLAC TROMETHAMINE 30 MG/ML IJ SOLN
15.0000 mg | Freq: Once | INTRAMUSCULAR | Status: AC
  Administered 2017-05-03: 15 mg via INTRAVENOUS
  Filled 2017-05-03: qty 1

## 2017-05-03 MED ORDER — SODIUM CHLORIDE 0.9 % IV BOLUS (SEPSIS)
1000.0000 mL | Freq: Once | INTRAVENOUS | Status: AC
  Administered 2017-05-03: 1000 mL via INTRAVENOUS

## 2017-05-03 MED ORDER — LORAZEPAM 2 MG/ML IJ SOLN
1.0000 mg | Freq: Once | INTRAMUSCULAR | Status: AC
  Administered 2017-05-03: 1 mg via INTRAVENOUS
  Filled 2017-05-03: qty 1

## 2017-05-03 NOTE — ED Provider Notes (Signed)
[facility] EMERGENCY DEPARTMENT Provider Note   CSN: [id number] Arrival date & time: 05/03/17  0246     History   Chief Complaint Chief Complaint  Patient presents with  . Seizures    HPI Patricia Irwin is a 27 y.o. female.  The history is provided by the patient and the EMS personnel.  Seizures   This is a new problem. The current episode started less than 1 hour ago. The problem has been resolved. There was 1 seizure. The most recent episode lasted 30 to 120 seconds. Associated symptoms include headaches. Pertinent negatives include no chest pain. Characteristics include loss of consciousness and bit tongue. Characteristics do not include bladder incontinence. There has been no fever.  History of anxiety, chronic headache, seizure disorder presents with seizure.  Per patient, she woke her husband up due to a seizure.  Seizure lasted approximately 45 seconds and terminated spontaneously.  She reports last seizure was over 2 months ago.  She reports she is compliant with her Keppra.  She does report she bit her tongue tonight, but denies any urinary incontinence.  She reports a post seizure headache, as well as diffuse pain throughout her body.  She reports her biggest Concern is that she is having so much pain after the seizure.  Patient reports prior to the seizure, she had no recent illnesses, no flulike symptoms, no chest pain, and no headache prior to seizure  Past Medical History:  Diagnosis Date  . Anxiety   . Chronic headache   . Chronic knee pain   . Hx of electroencephalogram 12/2013 ([facility])   normal, dx non-epileptic seizures  . Hypoglycemia   . Noncompliance with medications   . Pseudoseizures   . PTSD (post-traumatic stress disorder)   . Seizures (HCC)   . Vaginal Pap smear, abnormal     Patient Active Problem List   Diagnosis Date Noted  . History of seizures 11/10/2016  . Irregular periods 11/10/2016  . History of posttraumatic stress disorder (PTSD)  11/10/2016  . Patient desires pregnancy 06/02/2016  . History of ovarian cyst 06/02/2016  . Missed periods 06/02/2016    Past Surgical History:  Procedure Laterality Date  . WISDOM TOOTH EXTRACTION      OB History    Gravida Para Term Preterm AB Living   0             SAB TAB Ectopic Multiple Live Births                   Home Medications    Prior to Admission medications   Medication Sig Start Date End Date Taking? Authorizing Provider  Cariprazine HCl (VRAYLAR) 3 MG CAPS Take 3 mg by mouth at bedtime.   Yes [provider]  clomiPHENE (CLOMID) 50 MG tablet Take 1 tablet (50 mg total) by mouth daily. Days 3-7 of cycle 11/19/16  Yes Tilda Burrow, MD  clomiPHENE (CLOMID) 50 MG tablet TAKE 1 TABLET BY MOUTH DAILY ON DAYS 3-7 OF CYCLE. 01/22/17  Yes Tilda Burrow, MD  clonazePAM (KLONOPIN) 1 MG tablet Take 1 mg by mouth 3 (three) times daily.   Yes [provider]  dextromethorphan-guaiFENesin (MUCINEX DM) 30-600 MG 12hr tablet Take 1 tablet by mouth 2 (two) times daily. 02/06/17  Yes Vanetta Mulders, MD  escitalopram (LEXAPRO) 10 MG tablet Take 10 mg by mouth daily.   Yes [provider]  levETIRAcetam (KEPPRA XR) 500 MG 24 hr tablet Take 2 tablets (1,000 mg  total) by mouth daily. Patient taking differently: Take 2,000 mg by mouth daily.  04/07/13  Yes Triplett, Tammy, PA-C  prazosin (MINIPRESS) 2 MG capsule Take 2 mg by mouth at bedtime.   Yes [provider]  predniSONE (DELTASONE) 20 MG tablet Take 3 po QD x 3d , then 2 po QD x 3d then 1 po QD x 3d 04/18/17  Yes [name], MD  propranolol (INDERAL) 20 MG tablet Take 20 mg by mouth daily as needed (for high blood pressure).   Yes [provider]    Family History Family History  Problem Relation Age of Onset  . Diabetes Father   . Heart disease Father   . Hypertension Father   . Diabetes Paternal Grandfather   . Hypertension Paternal Grandfather   . Diabetes Paternal  Grandmother   . Hypertension Paternal Grandmother   . Ovarian cancer Maternal Grandmother   . Diabetes Maternal Grandmother   . Diabetes Maternal Grandfather   . Bipolar disorder Mother   . Cancer Maternal Aunt        skin    Social History Social History   Tobacco Use  . Smoking status: Current Every Day Smoker    Packs/day: 0.25    Types: Cigarettes  . Smokeless tobacco: Never Used  . Tobacco comment: 3 a day  Substance Use Topics  . Alcohol use: No    Comment: occasional  . Drug use: No     Allergies   Dilantin [phenytoin]; Amoxicillin; Amoxicillin-pot clavulanate; Bee venom; Celexa [citalopram hydrobromide]; Cinnamon; Influenza vaccines; Metronidazole; Penicillins; Tape; Latex; and Trintellix [vortioxetine]   Review of Systems Review of Systems  Constitutional: Positive for fatigue. Negative for fever.  Cardiovascular: Negative for chest pain.  Genitourinary: Negative for bladder incontinence.  Musculoskeletal: Positive for myalgias.  Neurological: Positive for seizures, loss of consciousness and headaches.  All other systems reviewed and are negative.    Physical Exam Updated Vital Signs BP (!) 122/92 (BP Location: Right Arm)   Pulse 89   Temp 98.7 F (37.1 C) (Oral)   Resp 18   Ht 1.6 m (5\' 3" )   Wt 82.1 kg (181 lb)   LMP 04/22/2017   SpO2 96%   BMI 32.06 kg/m   Physical Exam CONSTITUTIONAL: Well developed/well nourished HEAD: Normocephalic/atraumatic EYES: EOMI/PERRL ENMT: Mucous membranes moist, no large tongue lacerations noted NECK: supple no meningeal signs SPINE/BACK:entire spine nontender CV: S1/S2 noted, no murmurs/rubs/gallops noted LUNGS: Lungs are clear to auscultation bilaterally, no apparent distress ABDOMEN: soft, nontender, no bruising GU:no cva tenderness NEURO: Pt is awake/alert/appropriate, moves all extremitiesx4.  No facial droop.   EXTREMITIES: pulses normal/equal, full ROM, diffuse soreness to palpation of upper and lower  extremities, but no deformities noted.  Full range of motion of all extremities SKIN: warm, color normal PSYCH: no abnormalities of mood noted, alert and oriented to situation   ED Treatments / Results  Labs (all labs ordered are listed, but only abnormal results are displayed) Labs Reviewed  BASIC METABOLIC PANEL - Abnormal; Notable for the following components:      Result Value   Glucose, Bld 112 (*)    All other components within normal limits  CBC WITH DIFFERENTIAL/PLATELET  I-STAT BETA HCG BLOOD, ED ([facility], [facility], [facility] ONLY)    EKG  EKG Interpretation  Date/Time:  Monday May 03 2017 03:10:37 EDT Ventricular Rate:  80 PR Interval:    QRS Duration: 99 QT Interval:  362 QTC Calculation: 418 R Axis:   70 Text  Interpretation:  Sinus rhythm Borderline Q waves in inferior leads No significant change since last tracing Confirmed by Zadie RhineWickline, Marlina Cataldi ([id number]) on 05/03/2017 3:14:32 AM       Radiology No results found.  Procedures Procedures (including critical care time)  Medications Ordered in ED Medications  ketorolac (TORADOL) 30 MG/ML injection 15 mg (15 mg Intravenous Given 05/03/17 0333)  sodium chloride 0.9 % bolus 1,000 mL (1,000 mLs Intravenous New Bag/Given 05/03/17 0333)  LORazepam (ATIVAN) injection 1 mg (1 mg Intravenous Given 05/03/17 0333)     Initial Impression / Assessment and Plan / ED Course  I have reviewed the triage vital signs and the nursing notes.  Pertinent labs results that were available during my care of the patient were reviewed by me and considered in my medical decision making (see chart for details).     3:24 AM Patient with known history of seizures, presents tonight with a seizure that terminated spontaneously.  She is awake alert at this time.  She reports diffuse pain after the seizure.  Will treat in the emergency department reassess. 5:05 AM Labs reassuring.  Vitals appropriate.  Patient reports she feels improved.  No further seizure  activity here in the emergency department Will discharge home.  Advised follow with her neurologist, [facility]. We discussed seizure precaution, including no driving and no bathing alone Final Clinical Impressions(s) / ED Diagnoses   Final diagnoses:  Seizure [facility](HCC)    ED Discharge Orders    None       Zadie RhineWickline, Emanuele Mcwhirter, MD 05/03/17 [phone]

## 2017-05-03 NOTE — ED Notes (Signed)
Pt states that the only reason she came in was because she was in pain like she always is after a seizure and needs to have pain relief to sleep. States she hurts in shoulders, back, and neck.

## 2017-05-03 NOTE — Discharge Instructions (Signed)
Please be aware you may have another seizure ° °Do not drive until seen by your physician for your condition ° °Do not climb ladders/roofs/trees as a seizure can occur at that height and cause serious harm ° °Do not bathe/swim alone as a seizure can occur and cause serious harm ° °Please followup with your physician or neurologist for further testing and possible treatment ° ° °

## 2017-05-03 NOTE — ED Triage Notes (Signed)
Pt arrives via [facility] after husband states pt woke him up having a seizure. Pt alert and oriented in triage. Per EMS, pt had seizure-like activity en route lasting 10secs. Pt was alert and oriented immediately afterwards. Blood glucose 72.

## 2017-06-05 ENCOUNTER — Encounter: Payer: Self-pay | Admitting: Obstetrics and Gynecology

## 2017-07-05 ENCOUNTER — Other Ambulatory Visit: Payer: Self-pay

## 2017-07-05 ENCOUNTER — Emergency Department (HOSPITAL_COMMUNITY)
Admission: EM | Admit: 2017-07-05 | Discharge: 2017-07-05 | Disposition: A | Discharge status: Home / Self Care | Payer: Medicare Other | Attending: Emergency Medicine | Admitting: Emergency Medicine

## 2017-07-05 ENCOUNTER — Encounter (HOSPITAL_COMMUNITY): Payer: Self-pay | Admitting: Emergency Medicine

## 2017-07-05 DIAGNOSIS — K0889 Other specified disorders of teeth and supporting structures: Secondary | ICD-10-CM | POA: Insufficient documentation

## 2017-07-05 DIAGNOSIS — Z79899 Other long term (current) drug therapy: Secondary | ICD-10-CM | POA: Diagnosis not present

## 2017-07-05 DIAGNOSIS — F17210 Nicotine dependence, cigarettes, uncomplicated: Secondary | ICD-10-CM | POA: Diagnosis not present

## 2017-07-05 MED ORDER — AMOXICILLIN 250 MG PO CAPS
1000.0000 mg | ORAL_CAPSULE | Freq: Once | ORAL | Status: AC
  Administered 2017-07-05: 1000 mg via ORAL
  Filled 2017-07-05: qty 4

## 2017-07-05 MED ORDER — OXYCODONE-ACETAMINOPHEN 5-325 MG PO TABS
1.0000 | ORAL_TABLET | Freq: Once | ORAL | Status: AC
  Administered 2017-07-05: 1 via ORAL
  Filled 2017-07-05: qty 1

## 2017-07-05 MED ORDER — ACETAMINOPHEN-CODEINE #3 300-30 MG PO TABS: 1.0000 | ORAL_TABLET | Freq: Four times a day (QID) | ORAL | 0 refills | Status: DC | PRN

## 2017-07-05 MED ORDER — AMOXICILLIN 500 MG PO CAPS: 1000.0000 mg | ORAL_CAPSULE | Freq: Two times a day (BID) | ORAL | 0 refills | Status: DC

## 2017-07-05 NOTE — ED Triage Notes (Signed)
Pt c/o dental pain from previous extraction.

## 2017-07-05 NOTE — Discharge Instructions (Addendum)
Continue the oral care recommended by your dentist.  See your dentist tomorrow as scheduled.

## 2017-07-05 NOTE — ED Provider Notes (Signed)
[facility] EMERGENCY DEPARTMENT Provider Note   CSN: [id number] Arrival date & time: 07/05/17  0214     History   Chief Complaint Chief Complaint  Patient presents with  . Dental Pain    HPI Patricia Irwin is a 27 y.o. female.  The history is provided by the patient.  She had multiple dental extractions done last month, and has had difficulty with healing from 2 of the sites.  On 1 of her left upper molars, a flap had to be sewn into the socket to have it heal.  She is having pain at the site of a right upper molar extraction and she is concerned that she will need to have the same procedure done.  Pain has been much worse over the last several days.  She rates pain at 9/10.  She had received a prescription for acetaminophen with codeine which did give her relief, but she has run out of the medicine.  She has not run any fever or chills or sweats.  She rates pain at 9/10.  Past Medical History:  Diagnosis Date  . Anxiety   . Chronic headache   . Chronic knee pain   . Hx of electroencephalogram 12/2013 ([facility])   normal, dx non-epileptic seizures  . Hypoglycemia   . Noncompliance with medications   . Pseudoseizures   . PTSD (post-traumatic stress disorder)   . Seizures (HCC)   . Vaginal Pap smear, abnormal     Patient Active Problem List   Diagnosis Date Noted  . History of seizures 11/10/2016  . Irregular periods 11/10/2016  . History of posttraumatic stress disorder (PTSD) 11/10/2016  . Patient desires pregnancy 06/02/2016  . History of ovarian cyst 06/02/2016  . Missed periods 06/02/2016    Past Surgical History:  Procedure Laterality Date  . WISDOM TOOTH EXTRACTION       OB History    Gravida  0   Para      Term      Preterm      AB      Living        SAB      TAB      Ectopic      Multiple      Live Births               Home Medications    Prior to Admission medications   Medication Sig Start Date End Date Taking? Authorizing  Provider  clonazePAM (KLONOPIN) 1 MG tablet Take 1 mg by mouth 3 (three) times daily.   Yes [provider]  dextromethorphan-guaiFENesin (MUCINEX DM) 30-600 MG 12hr tablet Take 1 tablet by mouth 2 (two) times daily. 02/06/17  Yes Vanetta Mulders, MD  escitalopram (LEXAPRO) 10 MG tablet Take 10 mg by mouth daily.   Yes [provider]  levETIRAcetam (KEPPRA XR) 500 MG 24 hr tablet Take 2 tablets (1,000 mg total) by mouth daily. Patient taking differently: Take 3,000 mg by mouth daily.  04/07/13  Yes Triplett, Tammy, PA-C  prazosin (MINIPRESS) 2 MG capsule Take 2 mg by mouth at bedtime.   Yes [provider]  Cariprazine HCl (VRAYLAR) 3 MG CAPS Take 3 mg by mouth at bedtime.    [provider]  clomiPHENE (CLOMID) 50 MG tablet Take 1 tablet (50 mg total) by mouth daily. Days 3-7 of cycle 11/19/16   Tilda Burrow, MD  clomiPHENE (CLOMID) 50 MG tablet TAKE 1 TABLET BY MOUTH DAILY ON DAYS 3-7  OF CYCLE. 01/22/17   Tilda Burrow, MD  predniSONE (DELTASONE) 20 MG tablet Take 3 po QD x 3d , then 2 po QD x 3d then 1 po QD x 3d 04/18/17   [name], MD  propranolol (INDERAL) 20 MG tablet Take 20 mg by mouth daily as needed (for high blood pressure).    [provider]    Family History Family History  Problem Relation Age of Onset  . Diabetes Father   . Heart disease Father   . Hypertension Father   . Diabetes Paternal Grandfather   . Hypertension Paternal Grandfather   . Diabetes Paternal Grandmother   . Hypertension Paternal Grandmother   . Ovarian cancer Maternal Grandmother   . Diabetes Maternal Grandmother   . Diabetes Maternal Grandfather   . Bipolar disorder Mother   . Cancer Maternal Aunt        skin    Social History Social History   Tobacco Use  . Smoking status: Current Every Day Smoker    Packs/day: 0.25    Types: Cigarettes  . Smokeless tobacco: Never Used  . Tobacco comment: 3 a day  Substance Use Topics  . Alcohol use:  No    Comment: occasional  . Drug use: No     Allergies   Dilantin [phenytoin]; Bee venom; Celexa [citalopram hydrobromide]; Cinnamon; Influenza vaccines; Metronidazole; Tape; Latex; and Trintellix [vortioxetine]   Review of Systems Review of Systems  All other systems reviewed and are negative.    Physical Exam Updated Vital Signs BP (!) 122/91 (BP Location: Left Arm)   Pulse 65   Temp 98.1 F (36.7 C) (Oral)   Resp 18   Ht  (1.6 m)   Wt 81.4 kg (179 lb 6.4 oz)   LMP 06/21/2017   SpO2 98%   BMI 31.78 kg/m   Physical Exam  Nursing note and vitals reviewed.  27 year old female, resting comfortably and in no acute distress. Vital signs are significant for borderline elevated diastolic blood pressure. Oxygen saturation is 98%, which is normal. Head is normocephalic and atraumatic. PERRLA, EOMI. Mouth exam shows numerous extractions.  Extraction site for tooth #3 has some erythema and puffiness and is markedly tender to palpation. Neck is nontender and supple without adenopathy or JVD. Back is nontender and there is no CVA tenderness. Lungs are clear without rales, wheezes, or rhonchi. Chest is nontender. Heart has regular rate and rhythm without murmur. Abdomen is soft, flat, nontender without masses or hepatosplenomegaly and peristalsis is normoactive. Extremities have no cyanosis or edema, full range of motion is present. Skin is warm and dry without rash. Neurologic: Mental status is normal, cranial nerves are intact, there are no motor or sensory deficits.  ED Treatments / Results   Procedures Procedures   Medications Ordered in ED Medications  oxyCODONE-acetaminophen (PERCOCET/ROXICET) 5-325 MG per tablet 1 tablet (has no administration in time range)  amoxicillin (AMOXIL) capsule 1,000 mg (has no administration in time range)     Initial Impression / Assessment and Plan / ED Course  I have reviewed the triage vital signs and the nursing notes.  Pain  at dental extraction site with nonhealing socket.  Patient already has an appointment with her dentist tomorrow and she is to keep that appointment.  She is given a dose of oxycodone-acetaminophen in the ED, and is discharged with prescription for a 2-day supply of acetaminophen with codeine.  Her record on the [place] controlled substance reporting website was  reviewed showing recent prescription for acetaminophen with codeine, and the amount prescribed would have run out if taken as prescribed.  Final Clinical Impressions(s) / ED Diagnoses   Final diagnoses:  Pain, dental    ED Discharge Orders        Ordered    amoxicillin (AMOXIL) 500 MG capsule  2 times daily     07/05/17 0318    acetaminophen-codeine (TYLENOL #3) 300-30 MG tablet  Every 6 hours PRN     07/05/17 0318       Dione Booze, MD 07/05/17 [phone]

## 2018-08-29 ENCOUNTER — Other Ambulatory Visit: Payer: Self-pay

## 2018-08-29 ENCOUNTER — Emergency Department (HOSPITAL_COMMUNITY)
Admission: EM | Admit: 2018-08-29 | Discharge: 2018-08-29 | Disposition: A | Discharge status: Home / Self Care | Payer: Medicare Other | Attending: Emergency Medicine | Admitting: Emergency Medicine

## 2018-08-29 ENCOUNTER — Emergency Department (HOSPITAL_COMMUNITY): Payer: Medicare Other

## 2018-08-29 ENCOUNTER — Ambulatory Visit (INDEPENDENT_AMBULATORY_CARE_PROVIDER_SITE_OTHER)
Admission: EM | Admit: 2018-08-29 | Discharge: 2018-08-29 | Disposition: A | Discharge status: Home / Self Care | Payer: Medicare Other | Source: Home / Self Care

## 2018-08-29 ENCOUNTER — Encounter (HOSPITAL_COMMUNITY): Payer: Self-pay | Admitting: Emergency Medicine

## 2018-08-29 DIAGNOSIS — S63501A Unspecified sprain of right wrist, initial encounter: Secondary | ICD-10-CM

## 2018-08-29 DIAGNOSIS — F17210 Nicotine dependence, cigarettes, uncomplicated: Secondary | ICD-10-CM | POA: Insufficient documentation

## 2018-08-29 DIAGNOSIS — Z888 Allergy status to other drugs, medicaments and biological substances status: Secondary | ICD-10-CM | POA: Insufficient documentation

## 2018-08-29 DIAGNOSIS — Z91048 Other nonmedicinal substance allergy status: Secondary | ICD-10-CM | POA: Insufficient documentation

## 2018-08-29 DIAGNOSIS — R569 Unspecified convulsions: Secondary | ICD-10-CM | POA: Diagnosis not present

## 2018-08-29 DIAGNOSIS — M25531 Pain in right wrist: Secondary | ICD-10-CM | POA: Insufficient documentation

## 2018-08-29 DIAGNOSIS — Z91040 Latex allergy status: Secondary | ICD-10-CM | POA: Insufficient documentation

## 2018-08-29 DIAGNOSIS — Z887 Allergy status to serum and vaccine status: Secondary | ICD-10-CM | POA: Diagnosis not present

## 2018-08-29 DIAGNOSIS — Z79899 Other long term (current) drug therapy: Secondary | ICD-10-CM | POA: Diagnosis not present

## 2018-08-29 DIAGNOSIS — Z91030 Bee allergy status: Secondary | ICD-10-CM | POA: Diagnosis not present

## 2018-08-29 MED ORDER — DICLOFENAC SODIUM 75 MG PO TBEC: 75.0000 mg | DELAYED_RELEASE_TABLET | Freq: Two times a day (BID) | ORAL | 0 refills | Status: DC

## 2018-08-29 NOTE — ED Provider Notes (Signed)
[facility]    CSN: [id number] Arrival date & time: 08/29/18  1642      History   Chief Complaint Chief Complaint  Patient presents with  . Wrist Injury    HPI Patricia Irwin is a 28 y.o. female.   The history is provided by the patient. No language interpreter was used.  Wrist Injury Location:  Wrist Injury: no   Pain details:    Quality:  Aching   Radiates to:  Does not radiate   Timing:  Constant   Progression:  Worsening Handedness:  Right-handed Dislocation: no   Foreign body present:  No foreign bodies Relieved by:  Nothing Worsened by:  Nothing Ineffective treatments:  None tried Associated symptoms: no swelling    Pt complains of pain in her right wrist. Pt reports she is suppose to be referred to an Orthopaedist.  Past Medical History:  Diagnosis Date  . Anxiety   . Chronic headache   . Chronic knee pain   . Hx of electroencephalogram 12/2013 ([facility])   normal, dx non-epileptic seizures  . Hypoglycemia   . Noncompliance with medications   . Pseudoseizures   . PTSD (post-traumatic stress disorder)   . Seizures (HCC)   . Vaginal Pap smear, abnormal     Patient Active Problem List   Diagnosis Date Noted  . History of seizures 11/10/2016  . Irregular periods 11/10/2016  . History of posttraumatic stress disorder (PTSD) 11/10/2016  . Patient desires pregnancy 06/02/2016  . History of ovarian cyst 06/02/2016  . Missed periods 06/02/2016    Past Surgical History:  Procedure Laterality Date  . WISDOM TOOTH EXTRACTION      OB History    Gravida  0   Para      Term      Preterm      AB      Living        SAB      TAB      Ectopic      Multiple      Live Births               Home Medications    Prior to Admission medications   Medication Sig Start Date End Date Taking? Authorizing Provider  acetaminophen-codeine (TYLENOL #3) 300-30 MG tablet Take 1-2 tablets by mouth every 6 (six) hours as needed for moderate  pain. 07/05/17   Dione BoozeGlick, David, MD  amoxicillin (AMOXIL) 500 MG capsule Take 2 capsules (1,000 mg total) by mouth 2 (two) times daily. 07/05/17   Dione BoozeGlick, David, MD  Cariprazine HCl (VRAYLAR) 3 MG CAPS Take 3 mg by mouth at bedtime.    [provider]  clonazePAM (KLONOPIN) 1 MG tablet Take 1 mg by mouth 3 (three) times daily.    [provider]  escitalopram (LEXAPRO) 10 MG tablet Take 10 mg by mouth daily.    [provider]  gabapentin (NEURONTIN) 300 MG capsule Take 300 mg by mouth 3 (three) times daily. 06/07/18   [provider]  HYDROcodone-acetaminophen (NORCO) 5-325 MG tablet Take 1 tablet by mouth every 1 hour x 4 doses.    [provider]  levETIRAcetam (KEPPRA XR) 500 MG 24 hr tablet Take 2 tablets (1,000 mg total) by mouth daily. Patient taking differently: Take 3,000 mg by mouth daily.  04/07/13   Triplett, Tammy, PA-C  prazosin (MINIPRESS) 2 MG capsule Take 2 mg by mouth at bedtime.    [provider]  Family History Family History  Problem Relation Age of Onset  . Diabetes Father   . Heart disease Father   . Hypertension Father   . Diabetes Paternal Grandfather   . Hypertension Paternal Grandfather   . Diabetes Paternal Grandmother   . Hypertension Paternal Grandmother   . Ovarian cancer Maternal Grandmother   . Diabetes Maternal Grandmother   . Diabetes Maternal Grandfather   . Bipolar disorder Mother   . Cancer Maternal Aunt        skin    Social History Social History   Tobacco Use  . Smoking status: Current Every Day Smoker    Packs/day: 0.25    Types: Cigarettes  . Smokeless tobacco: Never Used  . Tobacco comment: 3 a day  Substance Use Topics  . Alcohol use: No    Comment: occasional  . Drug use: No     Allergies   Dilantin [phenytoin], Bee venom, Celexa [citalopram hydrobromide], Cinnamon, Influenza vaccines, Metronidazole, Tape, Latex, and Trintellix [vortioxetine]   Review of Systems Review of  Systems  All other systems reviewed and are negative.    Physical Exam Triage Vital Signs ED Triage Vitals  Enc Vitals Group     BP 08/29/18 1657 120/87     Pulse Rate 08/29/18 1657 74     Resp 08/29/18 1657 20     Temp 08/29/18 1657 98.1 F (36.7 C)     Temp src --      SpO2 08/29/18 1657 96 %     Weight --      Height --      Head Circumference --      Peak Flow --      Pain Score 08/29/18 1652 10     Pain Loc --      Pain Edu? --      Excl. in GC? --    No data found.  Updated Vital Signs BP 120/87   Pulse 74   Temp 98.1 F (36.7 C)   Resp 20   LMP 08/07/2018   SpO2 96%   Visual Acuity Right Eye Distance:   Left Eye Distance:   Bilateral Distance:    Right Eye Near:   Left Eye Near:    Bilateral Near:     Physical Exam Vitals signs reviewed.  HENT:     Head: Normocephalic.  Neck:     Musculoskeletal: Normal range of motion.  Cardiovascular:     Rate and Rhythm: Normal rate.  Musculoskeletal:        General: No swelling or tenderness.  Skin:    General: Skin is warm.  Neurological:     General: No focal deficit present.     Mental Status: She is alert.  Psychiatric:        Mood and Affect: Mood normal.      UC Treatments / Results  Labs (all labs ordered are listed, but only abnormal results are displayed) Labs Reviewed - No data to display  EKG   Radiology No results found.  Procedures Procedures (including critical care time)  Medications Ordered in UC Medications - No data to display  Initial Impression / Assessment and Plan / UC Course  I have reviewed the triage vital signs and the nursing notes.  Pertinent labs & imaging results that were available during my care of the patient were reviewed by me and considered in my medical decision making (see chart for details).     Pt placed in a splint.  Pt advised to have her primary refer her to an Orthopaedist.  Pt given rx for voltaren.  (Question of current hydrocodone rx  on chart, Pt upset because she states she is not taking, has not filled)  I do not think pt needs narcotics for this injury. Rn will attempt to reconcile.  Final Clinical Impressions(s) / UC Diagnoses   Final diagnoses:  Wrist sprain, right, initial encounter     Discharge Instructions     Call your Physician for referral to Orthopaedic hand surgeon.  Discuss pain medication with your Physician      ED Prescriptions    Medication Sig Dispense Auth. Provider   diclofenac (VOLTAREN) 75 MG EC tablet Take 1 tablet (75 mg total) by mouth 2 (two) times daily. 20 tablet Fransico Meadow, [place]     Controlled Substance Prescriptions [place] Controlled Substance Registry consulted? Yes, I have consulted the [place] Controlled Substances Registry for this patient, and feel the risk/benefit ratio today is favorable for proceeding with this prescription for a controlled substance.   Fransico Meadow, [place] 08/29/18 1749

## 2018-08-29 NOTE — ED Triage Notes (Signed)
Pt injured right wrist 3 weeks ago by falling, pt states pain is worse. States x rays were negative at [facility]

## 2018-08-29 NOTE — Discharge Instructions (Addendum)
Call your Physician for referral to Orthopaedic hand surgeon.  Discuss pain medication with your Physician

## 2018-08-29 NOTE — Discharge Instructions (Signed)
You have been diagnosed today with right wrist pain.  At this time there does not appear to be the presence of an emergent medical condition, however there is always the potential for conditions to change. Please read and follow the below instructions.  Please return to the Emergency Department immediately for any new or worsening symptoms. Please be sure to follow up with your Primary Care Provider within one week regarding your visit today; please call their office to schedule an appointment even if you are feeling better for a follow-up visit. Please continue using the thumb spica splint given to you today to protect the small bones in your wrist.  Please call Dr. Ruthe Mannan office, he is a orthopedic specialist.  Call Dr. Ruthe Mannan office tomorrow morning to schedule a follow-up appointment for further evaluation, follow-up imaging may be needed in the future to evaluate for a small bone called the scaphoid bone and orthopedic follow-up is strongly recommended.  Please read the additional information packets attached to your discharge summary.

## 2018-08-29 NOTE — ED Triage Notes (Signed)
Pt c/o continued right wrist pain. Pt seen at Urgent Care earlier today-pt given a prescription for diclofenac and told to f/u with orthopedic surgeon. Pt here for pain control.

## 2018-08-29 NOTE — ED Provider Notes (Signed)
[facility] EMERGENCY DEPARTMENT Provider Note   CSN: [id number] Arrival date & time: 08/29/18  1800    History   Chief Complaint Chief Complaint  Patient presents with   Wrist Pain    HPI Patricia Irwin is a 28 y.o. female ending today with right wrist pain.  Patient reports that she had a hyperflexion injury 3 weeks ago while attempting to push herself from a sitting position to a standing position.  Patient reports that she felt she had a cyst to her dorsal right wrist that has since resolved.  She describes a moderate intensity throbbing sensation that has been constant since time of injury to her radial right wrist occasionally radiating up her arm worsened with movement and palpation and without alleviating factors.  She has been taking Mobic without relief.  Patient reports she was seen at an urgent care on 08/24/2018 and had x-rays performed without evidence of injury, she was given a splint.  Patient again presented to an urgent care today for further pain control, she was given prescription for Voltaren which she has not filled and now presents to this ER for further evaluation and pain control.  She denies reinjury, numbness/weakness, tingling, fever/chills, joint swelling, color change or any additional concerns today.    HPI  Past Medical History:  Diagnosis Date   Anxiety    Chronic headache    Chronic knee pain    Hx of electroencephalogram 12/2013 ([facility])   normal, dx non-epileptic seizures   Hypoglycemia    Noncompliance with medications    Pseudoseizures    PTSD (post-traumatic stress disorder)    Seizures ([place])    Vaginal Pap smear, abnormal     Patient Active Problem List   Diagnosis Date Noted   History of seizures 11/10/2016   Irregular periods 11/10/2016   History of posttraumatic stress disorder (PTSD) 11/10/2016   Patient desires pregnancy 06/02/2016   History of ovarian cyst 06/02/2016   Missed periods 06/02/2016    Past Surgical  History:  Procedure Laterality Date   WISDOM TOOTH EXTRACTION       OB History    Gravida  0   Para      Term      Preterm      AB      Living        SAB      TAB      Ectopic      Multiple      Live Births               Home Medications    Prior to Admission medications   Medication Sig Start Date End Date Taking? Authorizing Provider  acetaminophen-codeine (TYLENOL #3) 300-30 MG tablet Take 1-2 tablets by mouth every 6 (six) hours as needed for moderate pain. 4/33/29   Delora Fuel, MD  diclofenac (VOLTAREN) 75 MG EC tablet Take 1 tablet (75 mg total) by mouth 2 (two) times daily. 08/29/18   Fransico Meadow, PA-C  escitalopram (LEXAPRO) 10 MG tablet Take 10 mg by mouth daily.    [provider]  gabapentin (NEURONTIN) 300 MG capsule Take 300 mg by mouth 3 (three) times daily. 06/07/18   [provider]  HYDROcodone-acetaminophen (NORCO) 5-325 MG tablet Take 1 tablet by mouth every 1 hour x 4 doses.    [provider]  levETIRAcetam (KEPPRA XR) 500 MG 24 hr tablet Take 2 tablets (1,000 mg total) by mouth daily. Patient taking differently: Take  3,000 mg by mouth daily.  04/07/13   Pauline Ausriplett, Tammy, PA-C    Family History Family History  Problem Relation Age of Onset   Diabetes Father    Heart disease Father    Hypertension Father    Diabetes Paternal Grandfather    Hypertension Paternal Grandfather    Diabetes Paternal Grandmother    Hypertension Paternal Grandmother    Ovarian cancer Maternal Grandmother    Diabetes Maternal Grandmother    Diabetes Maternal Grandfather    Bipolar disorder Mother    Cancer Maternal Aunt        skin    Social History Social History   Tobacco Use   Smoking status: Current Every Day Smoker    Packs/day: 0.25    Types: Cigarettes   Smokeless tobacco: Never Used   Tobacco comment: 3 a day  Substance Use Topics   Alcohol use: No    Comment: occasional   Drug use: No      Allergies   Dilantin [phenytoin], Bee venom, Celexa [citalopram hydrobromide], Cinnamon, Influenza vaccines, Metronidazole, Tape, Latex, and Trintellix [vortioxetine]   Review of Systems Review of Systems  Constitutional: Negative.  Negative for chills and fever.  Musculoskeletal: Positive for arthralgias (Right wrist). Negative for back pain, joint swelling and neck pain.  Skin: Negative.  Negative for color change and wound.  Neurological: Negative.  Negative for weakness and numbness.   Physical Exam Updated Vital Signs BP 133/90    Pulse 76    Temp 98.5 F (36.9 C)    Resp 18    LMP 08/07/2018    SpO2 100%   Physical Exam Constitutional:      General: She is not in acute distress.    Appearance: Normal appearance. She is well-developed. She is not ill-appearing or diaphoretic.  HENT:     Head: Normocephalic and atraumatic.     Right Ear: External ear normal.     Left Ear: External ear normal.     Nose: Nose normal.  Eyes:     General: Vision grossly intact. Gaze aligned appropriately.     Pupils: Pupils are equal, round, and reactive to light.  Neck:     Musculoskeletal: Normal range of motion.     Trachea: Trachea and phonation normal. No tracheal deviation.  Cardiovascular:     Rate and Rhythm: Normal rate and regular rhythm.     Pulses: Normal pulses.  Pulmonary:     Effort: Pulmonary effort is normal. No respiratory distress.  Abdominal:     General: There is no distension.     Palpations: Abdomen is soft.     Tenderness: There is no abdominal tenderness. There is no guarding or rebound.  Musculoskeletal: Normal range of motion.     Comments: Right hand: No gross deformities, skin intact. Fingers appear normal.  Tenderness to palpation diffusely over radial right wrist including the scaphoid area. No tenderness to palpation over flexor sheath.  Finger adduction/abduction intact with 5/5 strength.  Thumb opposition intact.  Somewhat decreased range of motion  secondary to pain, resisted range of motion intact to flexion/extension at wrist.  Appropriate range of motion and strength at MCP, PIP and DIP of all fingers.  FDS/FDP intact. Grip 5/5 strength.  Radial artery 2+ with <2sec cap refill in all fingers.  Sensation intact to light-tough in median/ulnar/radial distributions.  Compartments soft to palpation. - No overlying skin changes or signs of infection.   Skin:    General: Skin is warm and dry.  Neurological:     Mental Status: She is alert.     GCS: GCS eye subscore is 4. GCS verbal subscore is 5. GCS motor subscore is 6.     Comments: Speech is clear and goal oriented, follows commands Major Cranial nerves without deficit, no facial droop Moves extremities without ataxia, coordination intact  Psychiatric:        Behavior: Behavior normal.    ED Treatments / Results  Labs (all labs ordered are listed, but only abnormal results are displayed) Labs Reviewed  POC URINE PREG, ED    EKG None  Radiology Dg Wrist Complete Right  Result Date: 08/29/2018 CLINICAL DATA:  Pain following fall 3 weeks prior EXAM: RIGHT WRIST - COMPLETE 3+ VIEW COMPARISON:  None. FINDINGS: Frontal, oblique, lateral, and ulnar deviation scaphoid images obtained. No fracture or dislocation. Joint spaces appear normal. No erosive change or intra-articular calcification. IMPRESSION: No fracture or dislocation.  No evident arthropathy. Electronically Signed   By: Bretta BangWilliam  Woodruff III M.D.   On: 08/29/2018 20:49    Procedures Procedures (including critical care time)  Medications Ordered in ED Medications - No data to display   Initial Impression / Assessment and Plan / ED Course  I have reviewed the triage vital signs and the nursing notes.  Pertinent labs & imaging results that were available during my care of the patient were reviewed by me and considered in my medical decision making (see chart for details).    Patient with 3-week history of right  wrist pain she does have some pain over the scaphoid area.  There is no overlying skin changes, no signs of septic arthritis, cellulitis, DVT or compartment syndrome.  Patient has been placed in a thumb spica splint for protection of her scaphoid, will give her referral to Dr. Mort SawyersHarrison's office orthopedic hand surgeon for further evaluation.  Repeat imaging of the right wrist obtained today to evaluate for previously unseen fracture which was negative today.  DG Right Wrist: IMPRESSION: No fracture or dislocation.  No evident arthropathy.  - I advised patient to use rice therapy to help with her symptoms, continue use of the thumb spica splint for protection of the scaphoid bone.  Patient asking for stronger pain medication than NSAIDs today.  I do not feel that narcotic prescription is appropriate in this setting, I advised that patient follow-up with orthopedic specialist for further evaluation, referral given.  At this time there does not appear to be any evidence of an acute emergency medical condition and the patient appears stable for discharge with appropriate outpatient follow up. Diagnosis was discussed with patient who verbalizes understanding of care plan and is agreeable to discharge. I have discussed return precautions with patient who verbalizes understanding of return precautions. Patient encouraged to follow-up with their PCP and hand. All questions answered.  She has been discharged in good condition.   Note: Portions of this report may have been transcribed using voice recognition software. Every effort was made to ensure accuracy; however, inadvertent computerized transcription errors may still be present. Final Clinical Impressions(s) / ED Diagnoses   Final diagnoses:  Right wrist pain    ED Discharge Orders    None       Elizabeth PalauMorelli, Brynley Cuddeback A, PA-C 08/29/18 2132    Loren RacerYelverton, David, MD 08/29/18 2248

## 2018-09-01 ENCOUNTER — Telehealth: Payer: Self-pay

## 2018-09-01 NOTE — Telephone Encounter (Signed)
Patient called to see if she could see Dr. Aline Brochure tomorrow. When I told her there was nothing available for tomorrow, she stated that's ok I have an appointment with a [facility] on Tuesday, I will just keep that and hung up.

## 2018-09-13 ENCOUNTER — Emergency Department (HOSPITAL_COMMUNITY)
Admission: EM | Admit: 2018-09-13 | Discharge: 2018-09-13 | Disposition: A | Discharge status: Home / Self Care | Payer: Medicare Other | Attending: Emergency Medicine | Admitting: Emergency Medicine

## 2018-09-13 ENCOUNTER — Encounter (HOSPITAL_COMMUNITY): Payer: Self-pay

## 2018-09-13 ENCOUNTER — Other Ambulatory Visit: Payer: Self-pay

## 2018-09-13 DIAGNOSIS — F17210 Nicotine dependence, cigarettes, uncomplicated: Secondary | ICD-10-CM | POA: Diagnosis not present

## 2018-09-13 DIAGNOSIS — R51 Headache: Secondary | ICD-10-CM | POA: Diagnosis present

## 2018-09-13 DIAGNOSIS — J019 Acute sinusitis, unspecified: Secondary | ICD-10-CM | POA: Insufficient documentation

## 2018-09-13 DIAGNOSIS — Z3202 Encounter for pregnancy test, result negative: Secondary | ICD-10-CM | POA: Insufficient documentation

## 2018-09-13 DIAGNOSIS — Z79899 Other long term (current) drug therapy: Secondary | ICD-10-CM | POA: Diagnosis not present

## 2018-09-13 LAB — POC URINE PREG, ED: Preg Test, Ur: NEGATIVE

## 2018-09-13 MED ORDER — PSEUDOEPHEDRINE HCL 60 MG PO TABS: 60.0000 mg | ORAL_TABLET | Freq: Four times a day (QID) | ORAL | 0 refills | Status: DC | PRN

## 2018-09-13 MED ORDER — AMOXICILLIN 500 MG PO CAPS: 500.0000 mg | ORAL_CAPSULE | Freq: Three times a day (TID) | ORAL | 0 refills | Status: DC

## 2018-09-13 MED ORDER — KETOROLAC TROMETHAMINE 30 MG/ML IJ SOLN
30.0000 mg | Freq: Once | INTRAMUSCULAR | Status: AC
  Administered 2018-09-13: 30 mg via INTRAMUSCULAR
  Filled 2018-09-13: qty 1

## 2018-09-13 NOTE — ED Notes (Signed)
Pt left with friend driving 

## 2018-09-13 NOTE — ED Provider Notes (Signed)
[facility] EMERGENCY DEPARTMENT Provider Note   CSN: [id number] Arrival date & time: 09/13/18  4097     History   Chief Complaint Chief Complaint  Patient presents with  . Facial Pain    HPI Patricia Irwin is a 28 y.o. female.     HPI Patient presents with left-sided facial pain.  Has had for the last few days.  Worse this morning.  States more severe.  Is in the left upper cheek/jaw area.  No fevers.  No trauma.  Initially had relief with ibuprofen at home but states now that it is not working.  Mild nasal drainage.  No vision changes.  No pain with opening closing jaw. Past Medical History:  Diagnosis Date  . Anxiety   . Chronic headache   . Chronic knee pain   . Hx of electroencephalogram 12/2013 ([facility])   normal, dx non-epileptic seizures  . Hypoglycemia   . Noncompliance with medications   . Pseudoseizures   . PTSD (post-traumatic stress disorder)   . Seizures ([place])   . Vaginal Pap smear, abnormal     Patient Active Problem List   Diagnosis Date Noted  . History of seizures 11/10/2016  . Irregular periods 11/10/2016  . History of posttraumatic stress disorder (PTSD) 11/10/2016  . Patient desires pregnancy 06/02/2016  . History of ovarian cyst 06/02/2016  . Missed periods 06/02/2016    Past Surgical History:  Procedure Laterality Date  . WISDOM TOOTH EXTRACTION       OB History    Gravida  0   Para      Term      Preterm      AB      Living        SAB      TAB      Ectopic      Multiple      Live Births               Home Medications    Prior to Admission medications   Medication Sig Start Date End Date Taking? Authorizing Provider  amoxicillin (AMOXIL) 500 MG capsule Take 1 capsule (500 mg total) by mouth 3 (three) times daily. 09/13/18   Davonna Belling, MD  diclofenac (VOLTAREN) 75 MG EC tablet Take 1 tablet (75 mg total) by mouth 2 (two) times daily. 08/29/18   Fransico Meadow, PA-C  escitalopram (LEXAPRO) 20 MG tablet  Take 20 mg by mouth daily.     [provider]  gabapentin (NEURONTIN) 300 MG capsule Take 300 mg by mouth 2 (two) times a day.  06/07/18   [provider]  HYDROcodone-acetaminophen (NORCO) 5-325 MG tablet Take 1 tablet by mouth every 1 hour x 4 doses.    [provider]  levETIRAcetam (KEPPRA XR) 500 MG 24 hr tablet Take 2 tablets (1,000 mg total) by mouth daily. Patient taking differently: Take 3,000 mg by mouth daily.  04/07/13   Triplett, Tammy, PA-C  pseudoephedrine (SUDAFED) 60 MG tablet Take 1 tablet (60 mg total) by mouth every 6 (six) hours as needed for congestion. 09/13/18   Davonna Belling, MD    Family History Family History  Problem Relation Age of Onset  . Diabetes Father   . Heart disease Father   . Hypertension Father   . Diabetes Paternal Grandfather   . Hypertension Paternal Grandfather   . Diabetes Paternal Grandmother   . Hypertension Paternal Grandmother   . Ovarian cancer Maternal Grandmother   .  Diabetes Maternal Grandmother   . Diabetes Maternal Grandfather   . Bipolar disorder Mother   . Cancer Maternal Aunt        skin    Social History Social History   Tobacco Use  . Smoking status: Current Every Day Smoker    Packs/day: 1.00    Types: Cigarettes  . Smokeless tobacco: Never Used  . Tobacco comment: 3 a day  Substance Use Topics  . Alcohol use: No    Comment: occasional  . Drug use: No     Allergies   Dilantin [phenytoin], Bee venom, Celexa [citalopram hydrobromide], Cinnamon, Influenza vaccines, Metronidazole, Tape, Latex, and Trintellix [vortioxetine]   Review of Systems Review of Systems  Constitutional: Negative for appetite change.  HENT: Negative for ear discharge, facial swelling, sore throat, trouble swallowing and voice change.   Respiratory: Negative for shortness of breath.      Physical Exam Updated Vital Signs BP (!) 135/97 (BP Location: Left Arm)   Pulse 71   Temp 97.9 F (36.6 C)   Resp 17    Ht 5\' 3"  (1.6 m)   Wt 84.6 kg   LMP 08/06/2018   SpO2 98%   BMI 33.02 kg/m   Physical Exam Vitals signs and nursing note reviewed.  Constitutional:      Comments: Patient is tearful.  HENT:     Head:     Comments: Tenderness over left maxillary sinus.  Some edema in left nose.  No purulent drainage.  Somewhat poor dentition but no tenderness over jaw.  No tenderness over her soft tissue cheek.  No tenderness over TMJ and able to open and close jaw freely. Neck:     Musculoskeletal: No muscular tenderness.      ED Treatments / Results  Labs (all labs ordered are listed, but only abnormal results are displayed) Labs Reviewed  POC URINE PREG, ED    EKG None  Radiology No results found.  Procedures Procedures (including critical care time)  Medications Ordered in ED Medications  ketorolac (TORADOL) 30 MG/ML injection 30 mg (30 mg Intramuscular Given 09/13/18 0818)     Initial Impression / Assessment and Plan / ED Course  I have reviewed the triage vital signs and the nursing notes.  Pertinent labs & imaging results that were available during my care of the patient were reviewed by me and considered in my medical decision making (see chart for details).        Patient with likely sinusitis.  Due to the severe pain will treat with antibiotics at this time.  Also give decongestion.  Shot of Toradol given for pain relief now.  Final Clinical Impressions(s) / ED Diagnoses   Final diagnoses:  Acute sinusitis, recurrence not specified, unspecified location    ED Discharge Orders         Ordered    amoxicillin (AMOXIL) 500 MG capsule  3 times daily     09/13/18 0826    pseudoephedrine (SUDAFED) 60 MG tablet  Every 6 hours PRN     09/13/18 0826           Benjiman CorePickering, Rockney Grenz, MD 09/13/18 [phone]

## 2018-09-13 NOTE — ED Triage Notes (Signed)
Pt reports facial pain since yesterday. Unsure if pain is from tooth or sinuses. Pt is hurting from left side of upper lip to check and back to ear. Noted to have poor dentition. Pt states she has stress seizures and has had 3 this morning

## 2018-12-13 ENCOUNTER — Ambulatory Visit: Payer: Medicare Other | Admitting: Adult Health

## 2018-12-19 ENCOUNTER — Other Ambulatory Visit: Payer: Self-pay

## 2018-12-19 ENCOUNTER — Encounter: Payer: Self-pay | Admitting: Adult Health

## 2018-12-19 ENCOUNTER — Ambulatory Visit (INDEPENDENT_AMBULATORY_CARE_PROVIDER_SITE_OTHER): Payer: Medicare Other | Admitting: Adult Health

## 2018-12-19 VITALS — BP 134/93 | HR 94 | Ht 63.0 in | Wt 179.0 lb

## 2018-12-19 DIAGNOSIS — Z3201 Encounter for pregnancy test, result positive: Secondary | ICD-10-CM | POA: Insufficient documentation

## 2018-12-19 DIAGNOSIS — O3680X0 Pregnancy with inconclusive fetal viability, not applicable or unspecified: Secondary | ICD-10-CM

## 2018-12-19 DIAGNOSIS — Z3A01 Less than 8 weeks gestation of pregnancy: Secondary | ICD-10-CM | POA: Insufficient documentation

## 2018-12-19 DIAGNOSIS — Z87898 Personal history of other specified conditions: Secondary | ICD-10-CM

## 2018-12-19 LAB — POCT URINE PREGNANCY: Preg Test, Ur: POSITIVE — AB

## 2018-12-19 NOTE — Patient Instructions (Signed)
First Trimester of Pregnancy The first trimester of pregnancy is from week 1 until the end of week 13 (months 1 through 3). A week after a sperm fertilizes an egg, the egg will implant on the wall of the uterus. This embryo will begin to develop into a baby. Genes from you and your partner will form the baby. The female genes will determine whether the baby will be a boy or a girl. At 6-8 weeks, the eyes and face will be formed, and the heartbeat can be seen on ultrasound. At the end of 12 weeks, all the baby's organs will be formed. Now that you are pregnant, you will want to do everything you can to have a healthy baby. Two of the most important things are to get good prenatal care and to follow your health care provider's instructions. Prenatal care is all the medical care you receive before the baby's birth. This care will help prevent, find, and treat any problems during the pregnancy and childbirth. Body changes during your first trimester Your body goes through many changes during pregnancy. The changes vary from woman to woman.  You may gain or lose a couple of pounds at first.  You may feel sick to your stomach (nauseous) and you may throw up (vomit). If the vomiting is uncontrollable, call your health care provider.  You may tire easily.  You may develop headaches that can be relieved by medicines. All medicines should be approved by your health care provider.  You may urinate more often. Painful urination may mean you have a bladder infection.  You may develop heartburn as a result of your pregnancy.  You may develop constipation because certain hormones are causing the muscles that push stool through your intestines to slow down.  You may develop hemorrhoids or swollen veins (varicose veins).  Your breasts may begin to grow larger and become tender. Your nipples may stick out more, and the tissue that surrounds them (areola) may become darker.  Your gums may bleed and may be  sensitive to brushing and flossing.  Dark spots or blotches (chloasma, mask of pregnancy) may develop on your face. This will likely fade after the baby is born.  Your menstrual periods will stop.  You may have a loss of appetite.  You may develop cravings for certain kinds of food.  You may have changes in your emotions from day to day, such as being excited to be pregnant or being concerned that something may go wrong with the pregnancy and baby.  You may have more vivid and strange dreams.  You may have changes in your hair. These can include thickening of your hair, rapid growth, and changes in texture. Some women also have hair loss during or after pregnancy, or hair that feels dry or thin. Your hair will most likely return to normal after your baby is born. What to expect at prenatal visits During a routine prenatal visit:  You will be weighed to make sure you and the baby are growing normally.  Your blood pressure will be taken.  Your abdomen will be measured to track your baby's growth.  The fetal heartbeat will be listened to between weeks 10 and 14 of your pregnancy.  Test results from any previous visits will be discussed. Your health care provider may ask you:  How you are feeling.  If you are feeling the baby move.  If you have had any abnormal symptoms, such as leaking fluid, bleeding, severe headaches, or abdominal   cramping.  If you are using any tobacco products, including cigarettes, chewing tobacco, and electronic cigarettes.  If you have any questions. Other tests that may be performed during your first trimester include:  Blood tests to find your blood type and to check for the presence of any previous infections. The tests will also be used to check for low iron levels (anemia) and protein on red blood cells (Rh antibodies). Depending on your risk factors, or if you previously had diabetes during pregnancy, you may have tests to check for high blood sugar  that affects pregnant women (gestational diabetes).  Urine tests to check for infections, diabetes, or protein in the urine.  An ultrasound to confirm the proper growth and development of the baby.  Fetal screens for spinal cord problems (spina bifida) and Down syndrome.  HIV (human immunodeficiency virus) testing. Routine prenatal testing includes screening for HIV, unless you choose not to have this test.  You may need other tests to make sure you and the baby are doing well. Follow these instructions at home: Medicines  Follow your health care provider's instructions regarding medicine use. Specific medicines may be either safe or unsafe to take during pregnancy.  Take a prenatal vitamin that contains at least 600 micrograms (mcg) of folic acid.  If you develop constipation, try taking a stool softener if your health care provider approves. Eating and drinking   Eat a balanced diet that includes fresh fruits and vegetables, whole grains, good sources of protein such as meat, eggs, or tofu, and low-fat dairy. Your health care provider will help you determine the amount of weight gain that is right for you.  Avoid raw meat and uncooked cheese. These carry germs that can cause birth defects in the baby.  Eating four or five small meals rather than three large meals a day may help relieve nausea and vomiting. If you start to feel nauseous, eating a few soda crackers can be helpful. Drinking liquids between meals, instead of during meals, also seems to help ease nausea and vomiting.  Limit foods that are high in fat and processed sugars, such as fried and sweet foods.  To prevent constipation: ? Eat foods that are high in fiber, such as fresh fruits and vegetables, whole grains, and beans. ? Drink enough fluid to keep your urine clear or pale yellow. Activity  Exercise only as directed by your health care provider. Most women can continue their usual exercise routine during  pregnancy. Try to exercise for 30 minutes at least 5 days a week. Exercising will help you: ? Control your weight. ? Stay in shape. ? Be prepared for labor and delivery.  Experiencing pain or cramping in the lower abdomen or lower back is a good sign that you should stop exercising. Check with your health care provider before continuing with normal exercises.  Try to avoid standing for long periods of time. Move your legs often if you must stand in one place for a long time.  Avoid heavy lifting.  Wear low-heeled shoes and practice good posture.  You may continue to have sex unless your health care provider tells you not to. Relieving pain and discomfort  Wear a good support bra to relieve breast tenderness.  Take warm sitz baths to soothe any pain or discomfort caused by hemorrhoids. Use hemorrhoid cream if your health care provider approves.  Rest with your legs elevated if you have leg cramps or low back pain.  If you develop varicose veins in   your legs, wear support hose. Elevate your feet for 15 minutes, 3-4 times a day. Limit salt in your diet. Prenatal care  Schedule your prenatal visits by the twelfth week of pregnancy. They are usually scheduled monthly at first, then more often in the last 2 months before delivery.  Write down your questions. Take them to your prenatal visits.  Keep all your prenatal visits as told by your health care provider. This is important. Safety  Wear your seat belt at all times when driving.  Make a list of emergency phone numbers, including numbers for family, friends, the hospital, and police and fire departments. General instructions  Ask your health care provider for a referral to a local prenatal education class. Begin classes no later than the beginning of month 6 of your pregnancy.  Ask for help if you have counseling or nutritional needs during pregnancy. Your health care provider can offer advice or refer you to specialists for help  with various needs.  Do not use hot tubs, steam rooms, or saunas.  Do not douche or use tampons or scented sanitary pads.  Do not cross your legs for long periods of time.  Avoid cat litter boxes and soil used by cats. These carry germs that can cause birth defects in the baby and possibly loss of the fetus by miscarriage or stillbirth.  Avoid all smoking, herbs, alcohol, and medicines not prescribed by your health care provider. Chemicals in these products affect the formation and growth of the baby.  Do not use any products that contain nicotine or tobacco, such as cigarettes and e-cigarettes. If you need help quitting, ask your health care provider. You may receive counseling support and other resources to help you quit.  Schedule a dentist appointment. At home, brush your teeth with a soft toothbrush and be gentle when you floss. Contact a health care provider if:  You have dizziness.  You have mild pelvic cramps, pelvic pressure, or nagging pain in the abdominal area.  You have persistent nausea, vomiting, or diarrhea.  You have a bad smelling vaginal discharge.  You have pain when you urinate.  You notice increased swelling in your face, hands, legs, or ankles.  You are exposed to fifth disease or chickenpox.  You are exposed to German measles (rubella) and have never had it. Get help right away if:  You have a fever.  You are leaking fluid from your vagina.  You have spotting or bleeding from your vagina.  You have severe abdominal cramping or pain.  You have rapid weight gain or loss.  You vomit blood or material that looks like coffee grounds.  You develop a severe headache.  You have shortness of breath.  You have any kind of trauma, such as from a fall or a car accident. Summary  The first trimester of pregnancy is from week 1 until the end of week 13 (months 1 through 3).  Your body goes through many changes during pregnancy. The changes vary from  woman to woman.  You will have routine prenatal visits. During those visits, your health care provider will examine you, discuss any test results you may have, and talk with you about how you are feeling. This information is not intended to replace advice given to you by your health care provider. Make sure you discuss any questions you have with your health care provider. Document Released: 02/03/2001 Document Revised: 01/22/2017 Document Reviewed: 01/22/2016 Elsevier Patient Education  2020 [employer]  

## 2018-12-19 NOTE — Progress Notes (Signed)
Patient ID: Patricia Irwin, female   DOB: Nov 02, 1990, 28 y.o.   MRN: [id number] History of Present Illness: Patricia Irwin is a 28 year old white female, separated, G2P0010, in for a UPT, has missed a period and had 3+HPTs, has had some nausea and cram;ing PCP is [facility].   Current Medications, Allergies, Past Medical History, Past Surgical History, Family History and Social History were reviewed in [employer] record.     Review of Systems: +missed period +HPTs +nausea and some cramping   Physical Exam:BP (!) 134/93 (BP Location: Left Arm, Patient Position: Sitting, Cuff Size: Normal)   Pulse 94   Ht 5\' 3"  (1.6 m)   Wt 179 lb (81.2 kg)   LMP 11/10/2018   BMI 31.71 kg/m   UPT +, about 5+4 weeks by LMP with EDD 08/18/2019. General:  Well developed, well nourished, no acute distress Skin:  Warm and dry Neck:  Midline trachea, normal thyroid, good ROM, no lymphadenopathy Lungs; Clear to auscultation bilaterally Cardiovascular: Regular rate and rhythm Abdomen:  Soft, non tender, no hepatosplenomegaly Psych:  No mood changes, alert and cooperative,seems happy Fall risk is low PHQ 2 score 0.   Impression: 1. Pregnancy examination or test, positive result  2. Less than [redacted] weeks gestation of pregnancy Eat often Continue PNV Review handouts on first trimester and by [facility]   3. Encounter to determine fetal viability of pregnancy, single or unspecified fetus return in 2 weeks for dating [place]  4. History of seizures     Plan:

## 2018-12-26 ENCOUNTER — Telehealth: Payer: Self-pay | Admitting: *Deleted

## 2018-12-26 ENCOUNTER — Emergency Department (HOSPITAL_COMMUNITY)
Admission: EM | Admit: 2018-12-26 | Discharge: 2018-12-26 | Disposition: A | Discharge status: Home / Self Care | Payer: Medicare Other | Attending: Emergency Medicine | Admitting: Emergency Medicine

## 2018-12-26 ENCOUNTER — Encounter (HOSPITAL_COMMUNITY): Payer: Self-pay | Admitting: Emergency Medicine

## 2018-12-26 ENCOUNTER — Other Ambulatory Visit: Payer: Self-pay

## 2018-12-26 DIAGNOSIS — Z91040 Latex allergy status: Secondary | ICD-10-CM | POA: Diagnosis not present

## 2018-12-26 DIAGNOSIS — Z3A01 Less than 8 weeks gestation of pregnancy: Secondary | ICD-10-CM | POA: Diagnosis not present

## 2018-12-26 DIAGNOSIS — Z79899 Other long term (current) drug therapy: Secondary | ICD-10-CM | POA: Diagnosis not present

## 2018-12-26 DIAGNOSIS — O99891 Other specified diseases and conditions complicating pregnancy: Secondary | ICD-10-CM | POA: Insufficient documentation

## 2018-12-26 DIAGNOSIS — O99331 Smoking (tobacco) complicating pregnancy, first trimester: Secondary | ICD-10-CM | POA: Diagnosis not present

## 2018-12-26 DIAGNOSIS — Z20828 Contact with and (suspected) exposure to other viral communicable diseases: Secondary | ICD-10-CM | POA: Diagnosis not present

## 2018-12-26 DIAGNOSIS — J029 Acute pharyngitis, unspecified: Secondary | ICD-10-CM

## 2018-12-26 DIAGNOSIS — F17210 Nicotine dependence, cigarettes, uncomplicated: Secondary | ICD-10-CM | POA: Diagnosis not present

## 2018-12-26 LAB — GROUP A STREP BY PCR: Group A Strep by PCR: NOT DETECTED

## 2018-12-26 MED ORDER — AMOXICILLIN 500 MG PO CAPS: 500.0000 mg | ORAL_CAPSULE | Freq: Three times a day (TID) | ORAL | 0 refills | Status: DC

## 2018-12-26 NOTE — Telephone Encounter (Signed)
Patient states she is currently in the ER waiting to be seen.  States she has a cough, sore throat and a fever;thinks she has tonsillitis as she has "had it several times in the past". Informed patient that these are also symptoms of COVID and she needs to be tested.  Advised if test does come back positive, she will need to reschedule her dating u/s next week.  Pt verbalized understanding and stated she would let us know.

## 2018-12-26 NOTE — Telephone Encounter (Signed)
Pt left message that she is [redacted] weeks pregnant. She has a sore throat and cough. Wants to know what she can do.

## 2018-12-26 NOTE — Telephone Encounter (Signed)
Pt mother also left message that patient has a sore throat and cough.

## 2018-12-26 NOTE — ED Triage Notes (Signed)
Pt c/o of sore throat, nasal congestion and cough x 1 week.  Pt is also [redacted] weeks pregnant

## 2018-12-26 NOTE — Discharge Instructions (Addendum)
Tylenol every 4 hours if needed for pain or fever.  Drink plenty of fluids.  Take the antibiotic as directed until its finished.  Follow-up with your OB/GYN next week as previously scheduled

## 2018-12-26 NOTE — ED Provider Notes (Signed)
[facility] EMERGENCY DEPARTMENT Provider Note   CSN: [id number] Arrival date & time: 12/26/18  1152     History   Chief Complaint Chief Complaint  Patient presents with  . Sore Throat    HPI Patricia Irwin is a 28 y.o. female.     HPI   Patricia Irwin is a 28 y.o. female G2Ab1 at 6w gestation, who presents to the Emergency Department complaining of nasal congestion, sore throat and intermittent cough.  Symptoms have been present for 1 week.  She describes the cough as non-productive.  Her symptoms have been associated with clear rhinorrhea and throat pain with swallowing. States she has recurrent tonsillitis and feels her current symptoms are similar to previous.  States that her boyfriend had similar symptoms recently and had a negative COVID test last week.  She denies fever, chills, body aches, chest pain and shortness of breath.  She had a confirmed pregnancy test at [facility] and scheduled for her first [place] on November 9.  Denies abdominal pain and vaginal bleeding.    Past Medical History:  Diagnosis Date  . Anxiety   . Chronic headache   . Chronic knee pain   . Hx of electroencephalogram 12/2013 ([facility])   normal, dx non-epileptic seizures  . Hypoglycemia   . Noncompliance with medications   . Pseudoseizures   . PTSD (post-traumatic stress disorder)   . Seizures ([facility])   . Vaginal Pap smear, abnormal     Patient Active Problem List   Diagnosis Date Noted  . Encounter to determine fetal viability of pregnancy 12/19/2018  . Less than [redacted] weeks gestation of pregnancy 12/19/2018  . Pregnancy examination or test, positive result 12/19/2018  . History of seizures 11/10/2016  . Irregular periods 11/10/2016  . History of posttraumatic stress disorder (PTSD) 11/10/2016  . Patient desires pregnancy 06/02/2016  . History of ovarian cyst 06/02/2016  . Missed periods 06/02/2016    Past Surgical History:  Procedure Laterality Date  . WISDOM TOOTH EXTRACTION       OB  History    Gravida  2   Para      Term      Preterm      AB  1   Living        SAB  1   TAB      Ectopic      Multiple      Live Births               Home Medications    Prior to Admission medications   Medication Sig Start Date End Date Taking? Authorizing Provider  escitalopram (LEXAPRO) 20 MG tablet Take 20 mg by mouth daily.     [provider]  FOLIC ACID PO Take by mouth daily.    [provider]  gabapentin (NEURONTIN) 300 MG capsule Take 300 mg by mouth 3 (three) times daily.  06/07/18   [provider]  levETIRAcetam (KEPPRA XR) 500 MG 24 hr tablet Take 2 tablets (1,000 mg total) by mouth daily. Patient taking differently: Take 3,000 mg by mouth daily.  04/07/13   Lempi Edwin, PA-C  Prenatal Vit-Fe Fumarate-FA (PRENATAL VITAMIN PO) Take by mouth 2 (two) times daily.    [provider]    Family History Family History  Problem Relation Age of Onset  . Diabetes Father   . Heart disease Father   . Hypertension Father   . Diabetes Paternal Grandfather   . Hypertension Paternal  Grandfather   . Diabetes Paternal Grandmother   . Hypertension Paternal Grandmother   . Ovarian cancer Maternal Grandmother   . Diabetes Maternal Grandmother   . Diabetes Maternal Grandfather   . Bipolar disorder Mother   . Cancer Maternal Aunt        skin    Social History Social History   Tobacco Use  . Smoking status: Current Every Day Smoker    Packs/day: 0.25    Types: Cigarettes  . Smokeless tobacco: Never Used  . Tobacco comment: 3 a day  Substance Use Topics  . Alcohol use: No    Comment: occasional  . Drug use: No     Allergies   Dilantin [phenytoin], Bee venom, Celexa [citalopram hydrobromide], Cinnamon, Influenza vaccines, Metronidazole, Tape, Latex, and Trintellix [vortioxetine]   Review of Systems Review of Systems  Constitutional: Negative for activity change, appetite change, chills and fever.  HENT:  Positive for congestion, rhinorrhea and sore throat. Negative for ear pain, facial swelling, trouble swallowing and voice change.   Eyes: Negative for pain and visual disturbance.  Respiratory: Positive for cough. Negative for shortness of breath and wheezing.   Cardiovascular: Negative for chest pain.  Gastrointestinal: Negative for abdominal pain, diarrhea, nausea and vomiting.  Genitourinary: Negative for flank pain, pelvic pain and vaginal bleeding.  Musculoskeletal: Negative for arthralgias, neck pain and neck stiffness.  Skin: Negative for color change and rash.  Neurological: Negative for dizziness, numbness and headaches.  Hematological: Negative for adenopathy.     Physical Exam Updated Vital Signs Pulse 82   Temp 97.7 F (36.5 C) (Oral)   Resp 18   Ht 5\' 3"  (1.6 m)   Wt 80.3 kg   LMP 11/10/2018   SpO2 100%   BMI 31.35 kg/m   Physical Exam Vitals signs and nursing note reviewed.  Constitutional:      General: She is not in acute distress.    Appearance: She is well-developed. She is not ill-appearing.  HENT:     Head: Normocephalic.     Jaw: No trismus.     Right Ear: Tympanic membrane and ear canal normal.     Left Ear: Tympanic membrane and ear canal normal.     Mouth/Throat:     Mouth: Mucous membranes are moist.     Pharynx: Uvula midline. Oropharyngeal exudate and posterior oropharyngeal erythema present. No pharyngeal swelling or uvula swelling.     Tonsils: No tonsillar abscesses.     Comments: Mild erythema of the oropharynx, few exudates present.  No edema.  Uvula is midline and non-edematous Neck:     Musculoskeletal: Normal range of motion and neck supple.  Cardiovascular:     Rate and Rhythm: Normal rate and regular rhythm.     Pulses: Normal pulses.  Pulmonary:     Effort: Pulmonary effort is normal. No respiratory distress.     Breath sounds: Normal breath sounds. No wheezing.  Abdominal:     Palpations: Abdomen is soft. There is no  splenomegaly.     Tenderness: There is no abdominal tenderness. There is no right CVA tenderness or left CVA tenderness.  Musculoskeletal: Normal range of motion.     Right lower leg: No edema.     Left lower leg: No edema.  Lymphadenopathy:     Cervical: No cervical adenopathy.  Skin:    General: Skin is warm.     Findings: No rash.  Neurological:     General: No focal deficit present.  Mental Status: She is alert.     Sensory: No sensory deficit.     Motor: No weakness or abnormal muscle tone.      ED Treatments / Results  Labs (all labs ordered are listed, but only abnormal results are displayed) Labs Reviewed  GROUP A STREP BY PCR  SARS CORONAVIRUS 2 (TAT 6-24 HRS)    EKG None  Radiology No results found.  Procedures Procedures (including critical care time)  Medications Ordered in ED Medications - No data to display   Initial Impression / Assessment and Plan / ED Course  I have reviewed the triage vital signs and the nursing notes.  Pertinent labs & imaging results that were available during my care of the patient were reviewed by me and considered in my medical decision making (see chart for details).       [facility], has appt nov 9  She is well appearing.  Airway patent.  No concerning sx's for PTA.  Strep test negative.  COVID test pending.  Will treat for tonsillitis.  No pregnancy related complaints. She agrees to close f/u with her OB/GYN.  Return precautions discussed.    Final Clinical Impressions(s) / ED Diagnoses   Final diagnoses:  Pharyngitis, unspecified etiology    ED Discharge Orders    None       Pauline Ausriplett, Nathalia Wismer, PA-C 12/27/18 1307    Donnetta Hutchingook, Brian, MD 12/30/18 [phone]

## 2018-12-27 ENCOUNTER — Telehealth: Payer: Self-pay | Admitting: *Deleted

## 2018-12-27 LAB — SARS CORONAVIRUS 2 (TAT 6-24 HRS): SARS Coronavirus 2: NEGATIVE

## 2018-12-27 NOTE — Telephone Encounter (Signed)
Pt reports when she wipes she is seeing pink. Not enough to get in her underwear. No cramping or other symptoms. Pt just had sex. Advised it was likely due to that. Recommended no sex for 7 days after bleeding stops. Patient agreeable. No other questions at this time.

## 2019-01-02 ENCOUNTER — Other Ambulatory Visit: Payer: Self-pay

## 2019-01-02 ENCOUNTER — Ambulatory Visit (INDEPENDENT_AMBULATORY_CARE_PROVIDER_SITE_OTHER): Payer: Medicare Other

## 2019-01-02 DIAGNOSIS — Z3A01 Less than 8 weeks gestation of pregnancy: Secondary | ICD-10-CM | POA: Diagnosis not present

## 2019-01-02 DIAGNOSIS — O3680X0 Pregnancy with inconclusive fetal viability, not applicable or unspecified: Secondary | ICD-10-CM | POA: Diagnosis not present

## 2019-01-02 NOTE — Progress Notes (Signed)
[place] 7+4 wks,single IUP w/ys,positive fht 126 bpm,normal left ovaries,simple right ovarian cyst with irregular boarders 5 x 3.6 x 4 cm,crl 10.26 mm

## 2019-02-03 ENCOUNTER — Other Ambulatory Visit: Payer: Self-pay | Admitting: Obstetrics & Gynecology

## 2019-02-03 DIAGNOSIS — Z3682 Encounter for antenatal screening for nuchal translucency: Secondary | ICD-10-CM

## 2019-02-06 ENCOUNTER — Other Ambulatory Visit: Payer: Self-pay

## 2019-02-06 ENCOUNTER — Encounter: Payer: Self-pay | Admitting: Advanced Practice Midwife

## 2019-02-06 ENCOUNTER — Other Ambulatory Visit: Payer: Medicare Other

## 2019-02-06 ENCOUNTER — Ambulatory Visit (INDEPENDENT_AMBULATORY_CARE_PROVIDER_SITE_OTHER): Payer: Medicare Other | Admitting: Advanced Practice Midwife

## 2019-02-06 VITALS — BP 96/72 | HR 82 | Wt 173.0 lb

## 2019-02-06 DIAGNOSIS — Z0283 Encounter for blood-alcohol and blood-drug test: Secondary | ICD-10-CM

## 2019-02-06 DIAGNOSIS — Z130 Encounter for screening for diseases of the blood and blood-forming organs and certain disorders involving the immune mechanism: Secondary | ICD-10-CM

## 2019-02-06 DIAGNOSIS — Z331 Pregnant state, incidental: Secondary | ICD-10-CM

## 2019-02-06 DIAGNOSIS — Z1389 Encounter for screening for other disorder: Secondary | ICD-10-CM

## 2019-02-06 DIAGNOSIS — Z3481 Encounter for supervision of other normal pregnancy, first trimester: Secondary | ICD-10-CM

## 2019-02-06 DIAGNOSIS — Z87898 Personal history of other specified conditions: Secondary | ICD-10-CM

## 2019-02-06 DIAGNOSIS — R319 Hematuria, unspecified: Secondary | ICD-10-CM

## 2019-02-06 DIAGNOSIS — Z1379 Encounter for other screening for genetic and chromosomal anomalies: Secondary | ICD-10-CM

## 2019-02-06 DIAGNOSIS — Z3A12 12 weeks gestation of pregnancy: Secondary | ICD-10-CM | POA: Diagnosis not present

## 2019-02-06 DIAGNOSIS — F17200 Nicotine dependence, unspecified, uncomplicated: Secondary | ICD-10-CM | POA: Insufficient documentation

## 2019-02-06 DIAGNOSIS — Z113 Encounter for screening for infections with a predominantly sexual mode of transmission: Secondary | ICD-10-CM

## 2019-02-06 DIAGNOSIS — Z3490 Encounter for supervision of normal pregnancy, unspecified, unspecified trimester: Secondary | ICD-10-CM | POA: Insufficient documentation

## 2019-02-06 DIAGNOSIS — Z36 Encounter for antenatal screening for chromosomal anomalies: Secondary | ICD-10-CM

## 2019-02-06 LAB — POCT URINALYSIS DIPSTICK OB
Glucose, UA: NEGATIVE
Ketones, UA: NEGATIVE
Leukocytes, UA: NEGATIVE
Nitrite, UA: NEGATIVE

## 2019-02-06 MED ORDER — BLOOD PRESSURE MONITOR MISC: 0 refills | Status: DC

## 2019-02-06 NOTE — Progress Notes (Signed)
INITIAL OBSTETRICAL VISIT Patient name: Patricia Irwin MRN [id number]  Date of birth: 03-16-1990 Chief Complaint:   Initial Prenatal Visit  History of Present Illness:   Patricia Irwin is a 28 y.o. G36P0010 Caucasian female at [redacted]w[redacted]d by LMP and 7 week [place] with an Estimated Date of Delivery: 08/17/19 being seen today for her initial obstetrical visit.   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 Dr. Mitzi Hansen placed on neurontin for anxiety, stopped until she could ask if it's ok.  Feels like  Today she reports no complaints.  Patient's last menstrual period was 11/10/2018. Last pap 2019. Results were: normal Review of Systems:   Pertinent items are noted in HPI Denies cramping/contractions, leakage of fluid, vaginal bleeding, abnormal vaginal discharge w/ itching/odor/irritation, headaches, visual changes, shortness of breath, chest pain, abdominal pain, severe nausea/vomiting, or problems with urination or bowel movements unless otherwise stated above.  Pertinent History Reviewed:  Reviewed past medical,surgical, social, obstetrical and family history.  Reviewed problem list, medications and allergies. OB History  Gravida Para Term Preterm AB Living  2       1    SAB TAB Ectopic Multiple Live Births  1            # Outcome Date GA Lbr Len/2nd Weight Sex Delivery Anes PTL Lv  2 Current           1 SAB 02/2018           Physical Assessment:   Vitals:   02/06/19 1538  BP: 96/72  Pulse: 82  Weight: 173 lb (78.5 kg)  Body mass index is 30.65 kg/m.       Physical Examination:  General appearance - well appearing, and in no distress  Mental status - alert, oriented to person, place, and time  Psych:  She has a normal mood and affect  Skin - warm and dry, normal color, no suspicious lesions noted  Chest - effort normal, all lung  fields clear to auscultation bilaterally  Heart - normal rate and regular rhythm  Abdomen - soft, nontender  Extremities:  No swelling or varicosities noted  Fetal Heart Rate (bpm): 160 via [place]  Results for orders placed or performed in visit on 02/06/19 (from the past 24 hour(s))  POC Urinalysis Dipstick OB   Collection Time: 02/06/19  4:05 PM  Result Value Ref Range   Color, UA     Clarity, UA     Glucose, UA Negative Negative   Bilirubin, UA     Ketones, UA neg    Spec Grav, UA     Blood, UA small    pH, UA     POC,PROTEIN,UA Trace Negative, Trace, Small (1+), Moderate (2+), Large (3+), 4+   Urobilinogen, UA     Nitrite, UA neg    Leukocytes, UA Negative Negative   Appearance     Odor      Assessment & Plan:  1) Low-Risk Pregnancy G2P0010 at [redacted]w[redacted]d with an Estimated Date of Delivery: 08/17/19   2) Initial OB visit  3) Seizure disorder:  Partial and psychogenic--continue Keppra/folic acid  Meds:  Meds ordered this encounter  Medications  . Blood Pressure Monitor MISC    Sig: For regular home bp monitoring during pregnancy    Dispense:  1 each    Refill:  0  Z34.90    Initial labs obtained Continue prenatal vitamins Reviewed n/v relief measures and warning s/s to report Reviewed recommended weight gain based on pre-gravid BMI Encouraged well-balanced diet Watched video for carrier screening/genetic testing:  Genetic Screening discussed First Screen: requested Cystic fibrosis screening declined SMA screening declined Fragile X screening declined Ultrasound discussed; fetal survey: requested CCNC completed  Follow-up: Return in about 3 weeks (around 02/27/2019) for OB Mychart visit.   Orders Placed This Encounter  Procedures  . Urine Culture  . GC/Chlamydia Probe Amp  . Pain Management Screening Profile (10S)  . Sickle Cell Scr  . Obstetric Panel, Including HIV  . MaterniT 21 plus Core, Blood  . POC Urinalysis Dipstick OB    Christin Fudge  DNP, CNM 02/06/2019 4:19 PM

## 2019-02-07 LAB — SICKLE CELL SCREEN: Sickle Cell Screen: NEGATIVE

## 2019-02-08 LAB — SPECIMEN STATUS REPORT

## 2019-02-08 LAB — PMP SCREEN PROFILE (10S), URINE
Amphetamine Scrn, Ur: NEGATIVE ng/mL
BARBITURATE SCREEN URINE: NEGATIVE ng/mL
BENZODIAZEPINE SCREEN, URINE: NEGATIVE ng/mL
CANNABINOIDS UR QL SCN: NEGATIVE ng/mL
Cocaine (Metab) Scrn, Ur: NEGATIVE ng/mL
Creatinine(Crt), U: 229.6 mg/dL (ref 20.0–300.0)
Methadone Screen, Urine: NEGATIVE ng/mL
OXYCODONE+OXYMORPHONE UR QL SCN: NEGATIVE ng/mL
Opiate Scrn, Ur: NEGATIVE ng/mL
Ph of Urine: 6 (ref 4.5–8.9)
Phencyclidine Qn, Ur: NEGATIVE ng/mL
Propoxyphene Scrn, Ur: NEGATIVE ng/mL

## 2019-02-08 LAB — URINE CULTURE

## 2019-02-08 LAB — GC/CHLAMYDIA PROBE AMP
Chlamydia trachomatis, NAA: NEGATIVE
Neisseria Gonorrhoeae by PCR: NEGATIVE

## 2019-02-08 LAB — OBSTETRIC PANEL, INCLUDING HIV
Antibody Screen: NEGATIVE
Basophils Absolute: 0.1 10*3/uL (ref 0.0–0.2)
Basos: 1 %
EOS (ABSOLUTE): 0.1 10*3/uL (ref 0.0–0.4)
Eos: 1 %
HIV Screen 4th Generation wRfx: NONREACTIVE
Hematocrit: 41.3 % (ref 34.0–46.6)
Hemoglobin: 14.1 g/dL (ref 11.1–15.9)
Hepatitis B Surface Ag: NEGATIVE
Immature Grans (Abs): 0.1 10*3/uL (ref 0.0–0.1)
Immature Granulocytes: 1 %
Lymphocytes Absolute: 3.2 10*3/uL — ABNORMAL HIGH (ref 0.7–3.1)
Lymphs: 24 %
MCH: 28.3 pg (ref 26.6–33.0)
MCHC: 34.1 g/dL (ref 31.5–35.7)
MCV: 83 fL (ref 79–97)
Monocytes Absolute: 0.7 10*3/uL (ref 0.1–0.9)
Monocytes: 5 %
Neutrophils Absolute: 9.3 10*3/uL — ABNORMAL HIGH (ref 1.4–7.0)
Neutrophils: 68 %
Platelets: 446 10*3/uL (ref 150–450)
RBC: 4.98 x10E6/uL (ref 3.77–5.28)
RDW: 14.7 % (ref 11.7–15.4)
RPR Ser Ql: NONREACTIVE
Rh Factor: POSITIVE
Rubella Antibodies, IGG: 2.88 index (ref >0.99)
WBC: 13.3 10*3/uL — ABNORMAL HIGH (ref 3.4–10.8)

## 2019-02-11 LAB — MATERNIT 21 PLUS CORE, BLOOD
Fetal Fraction: 7
Result (T21): NEGATIVE
Trisomy 13 (Patau syndrome): NEGATIVE
Trisomy 18 (Edwards syndrome): NEGATIVE
Trisomy 21 (Down syndrome): NEGATIVE

## 2019-02-24 NOTE — L&D Delivery Note (Addendum)
OB/GYN Faculty Practice Delivery Note  Patricia Irwin is a 30 y.o. G2P0010 s/p vaginal delivery at [redacted]w[redacted]d. She was admitted for IOL due to gestational hypertension.   ROM: 2h 40m with clear fluid GBS Status: Negative Maximum Maternal Temperature: 98.9  Labor Progress: . Initially given several doses on cytotec.  Foley bulb was placed.  Then started on pitocin.  She received an epidural.  Progressed to complete without complication.    Delivery Date/Time: 07/28/2019 at 1404 Delivery: Called to room and patient was complete and pushing. Head delivered OA. No nuchal cord present. Shoulder and body delivered in usual fashion. Infant with spontaneous cry, placed on mother's abdomen, dried and stimulated. Cord clamped x 2 after 1-minute delay, and cut by father under my direct supervision. Cord blood drawn. Placenta delivered spontaneously with gentle cord traction. Fundus firm with massage and Pitocin. Labia, perineum, vagina, and cervix were inspected, found to have a left labial laceration and a 2nd degree perineal laceration, both of which were repaired with 3-0 absorbable suture with good hemostasis.   Placenta: Intact, 3 vessel cord Complications: None Lacerations: Left labial, 2nd degree perineal QBL: 218 cc Analgesia: Epidural  Postpartum Planning [ ]  message to sent to schedule follow-up  [ ]  vaccines UTD  Infant: Viable female  APGARs 8/9  per nursing documentation   Patricia Irwin , MD PGY-2 Resident, Family Medicine 07/28/2019, 3:37 PM

## 2019-02-27 ENCOUNTER — Encounter: Payer: Self-pay | Admitting: Advanced Practice Midwife

## 2019-02-27 ENCOUNTER — Other Ambulatory Visit: Payer: Self-pay

## 2019-02-27 ENCOUNTER — Telehealth (INDEPENDENT_AMBULATORY_CARE_PROVIDER_SITE_OTHER): Payer: Medicare Other | Admitting: Advanced Practice Midwife

## 2019-02-27 VITALS — BP 117/82 | HR 81

## 2019-02-27 DIAGNOSIS — Z3A15 15 weeks gestation of pregnancy: Secondary | ICD-10-CM

## 2019-02-27 DIAGNOSIS — O99342 Other mental disorders complicating pregnancy, second trimester: Secondary | ICD-10-CM

## 2019-02-27 DIAGNOSIS — F419 Anxiety disorder, unspecified: Secondary | ICD-10-CM

## 2019-02-27 DIAGNOSIS — Z3482 Encounter for supervision of other normal pregnancy, second trimester: Secondary | ICD-10-CM

## 2019-02-27 NOTE — Progress Notes (Signed)
   TELEHEALTH VIRTUAL OBSTETRICS VISIT ENCOUNTER NOTE Patient name: Patricia Irwin MRN [id number]  Irwin of birth: 1990-10-06  I connected with patient on 02/27/19 at  3:30 PM EST by MyChart and verified that I am speaking with the correct person using two identifiers. Due to COVID-19 recommendations, pt is not currently in our office.    I discussed the limitations, risks, security and privacy concerns of performing an evaluation and management service by telephone and the availability of in person appointments. I also discussed with the patient that there may be a patient responsible charge related to this service. The patient expressed understanding and agreed to proceed.  Chief Complaint:   Routine Prenatal Visit  History of Present Illness:   Patricia Irwin is a 29 y.o. G2P0010 female at [redacted]w[redacted]d with an Estimated Irwin of Delivery: 08/17/19 being evaluated today for ongoing management of a low-risk pregnancy.  Today she reports no complaints. Contractions: Not present. Vag. Bleeding: None.  Movement: Present. denies leaking of fluid. Review of Systems:   Pertinent items are noted in HPI Denies abnormal vaginal discharge w/ itching/odor/irritation, headaches, visual changes, shortness of breath, chest pain, abdominal pain, severe nausea/vomiting, or problems with urination or bowel movements unless otherwise stated above. Pertinent History Reviewed:  Reviewed past medical,surgical, social, obstetrical and family history.  Reviewed problem list, medications and allergies. Physical Assessment:   Vitals:   02/27/19 1541  BP: 117/82  Pulse: 81  There is no height or weight on file to calculate BMI.        Physical Examination:   General:  Alert, oriented and cooperative.   Mental Status: Normal mood and affect perceived. Normal judgment and thought content.  Rest of physical exam deferred due to type of encounter  No results found for this or any previous visit (from the past 24 hour(s)).   Assessment & Plan:  1) Pregnancy G2P0010 at [redacted]w[redacted]d with an Estimated Irwin of Delivery: 08/17/19   2) Seizure d/o partial & psychogenic, takes Keppra/folic acid  3) Anxiety, taking neurontin but plans to stop in 3rd trimester  4) Smoker, not assessed today   Meds: No orders of the defined types were placed in this encounter.   Labs/procedures today: none  Plan:  Continue routine obstetrical care.  Has home bp cuff.  Check bp weekly, let us know if >140/90.  Next visit:  will be in person for anatomy u/s    Reviewed: Preterm labor symptoms and general obstetric precautions including but not limited to vaginal bleeding, contractions, leaking of fluid and fetal movement were reviewed in detail with the patient. The patient was advised to call back or seek an in-person office evaluation/go to MAU at [facility] for any urgent or concerning symptoms. All questions were answered. Please refer to After Visit Summary for other counseling recommendations.    I provided 12 minutes of non-face-to-face time during this encounter.  Follow-up: No follow-ups on file.  Orders Placed This Encounter  Procedures  . US OB Comp + 14 Wk   Arabella [facility] 02/27/2019 3:57 PM

## 2019-03-02 ENCOUNTER — Inpatient Hospital Stay (HOSPITAL_BASED_OUTPATIENT_CLINIC_OR_DEPARTMENT_OTHER): Payer: Medicare Other

## 2019-03-02 ENCOUNTER — Other Ambulatory Visit: Payer: Self-pay

## 2019-03-02 ENCOUNTER — Inpatient Hospital Stay (HOSPITAL_COMMUNITY)
Admission: EM | Admit: 2019-03-02 | Discharge: 2019-03-02 | Disposition: A | Discharge status: Home / Self Care | Payer: Medicare Other | Attending: Family Medicine | Admitting: Family Medicine

## 2019-03-02 ENCOUNTER — Encounter (HOSPITAL_COMMUNITY): Payer: Self-pay | Admitting: Family Medicine

## 2019-03-02 ENCOUNTER — Encounter: Payer: Self-pay | Admitting: Advanced Practice Midwife

## 2019-03-02 DIAGNOSIS — E162 Hypoglycemia, unspecified: Secondary | ICD-10-CM | POA: Insufficient documentation

## 2019-03-02 DIAGNOSIS — O4692 Antepartum hemorrhage, unspecified, second trimester: Secondary | ICD-10-CM

## 2019-03-02 DIAGNOSIS — Z91030 Bee allergy status: Secondary | ICD-10-CM | POA: Diagnosis not present

## 2019-03-02 DIAGNOSIS — R569 Unspecified convulsions: Secondary | ICD-10-CM | POA: Diagnosis not present

## 2019-03-02 DIAGNOSIS — Z3A16 16 weeks gestation of pregnancy: Secondary | ICD-10-CM | POA: Diagnosis not present

## 2019-03-02 DIAGNOSIS — O99352 Diseases of the nervous system complicating pregnancy, second trimester: Secondary | ICD-10-CM | POA: Insufficient documentation

## 2019-03-02 DIAGNOSIS — O99342 Other mental disorders complicating pregnancy, second trimester: Secondary | ICD-10-CM | POA: Diagnosis not present

## 2019-03-02 DIAGNOSIS — O99332 Smoking (tobacco) complicating pregnancy, second trimester: Secondary | ICD-10-CM | POA: Diagnosis not present

## 2019-03-02 DIAGNOSIS — Z888 Allergy status to other drugs, medicaments and biological substances status: Secondary | ICD-10-CM | POA: Insufficient documentation

## 2019-03-02 DIAGNOSIS — F17210 Nicotine dependence, cigarettes, uncomplicated: Secondary | ICD-10-CM | POA: Diagnosis not present

## 2019-03-02 DIAGNOSIS — Z79899 Other long term (current) drug therapy: Secondary | ICD-10-CM | POA: Insufficient documentation

## 2019-03-02 DIAGNOSIS — Z91040 Latex allergy status: Secondary | ICD-10-CM | POA: Insufficient documentation

## 2019-03-02 DIAGNOSIS — R109 Unspecified abdominal pain: Secondary | ICD-10-CM | POA: Diagnosis not present

## 2019-03-02 DIAGNOSIS — Z91018 Allergy to other foods: Secondary | ICD-10-CM | POA: Diagnosis not present

## 2019-03-02 DIAGNOSIS — Z881 Allergy status to other antibiotic agents status: Secondary | ICD-10-CM | POA: Diagnosis not present

## 2019-03-02 DIAGNOSIS — O209 Hemorrhage in early pregnancy, unspecified: Secondary | ICD-10-CM | POA: Diagnosis present

## 2019-03-02 DIAGNOSIS — O99282 Endocrine, nutritional and metabolic diseases complicating pregnancy, second trimester: Secondary | ICD-10-CM | POA: Insufficient documentation

## 2019-03-02 DIAGNOSIS — O99891 Other specified diseases and conditions complicating pregnancy: Secondary | ICD-10-CM | POA: Insufficient documentation

## 2019-03-02 DIAGNOSIS — F419 Anxiety disorder, unspecified: Secondary | ICD-10-CM | POA: Diagnosis not present

## 2019-03-02 DIAGNOSIS — Z887 Allergy status to serum and vaccine status: Secondary | ICD-10-CM | POA: Diagnosis not present

## 2019-03-02 LAB — URINALYSIS, ROUTINE W REFLEX MICROSCOPIC
Bacteria, UA: NONE SEEN
Bilirubin Urine: NEGATIVE
Glucose, UA: NEGATIVE mg/dL
Ketones, ur: 5 mg/dL — AB
Leukocytes,Ua: NEGATIVE
Nitrite: NEGATIVE
Protein, ur: 30 mg/dL — AB
Specific Gravity, Urine: 1.026 (ref 1.005–1.030)
pH: 6 (ref 5.0–8.0)

## 2019-03-02 LAB — WET PREP, GENITAL
Clue Cells Wet Prep HPF POC: NONE SEEN
Sperm: NONE SEEN
Trich, Wet Prep: NONE SEEN

## 2019-03-02 NOTE — MAU Note (Signed)
Patricia Irwin is a 29 y.o. at [redacted]w[redacted]d here in MAU reporting: had some bloody mucus last night, had that for about an hour and the bleeding stopped. Started having cramping after and the cramping has continued into today. Having some white/clear discharge, noticed it today. Had IC last night, states she is a female ejaculator so she is unsure if that is related.   Onset of complaint: last night  Pain score: 6/10  Vitals:   03/02/19 1200  BP: (!) 129/94  Pulse: (!) 103  Resp: 16  Temp: 98.2 F (36.8 C)  SpO2: 99%     FHT: 145  Lab orders placed from triage: UA

## 2019-03-02 NOTE — Discharge Instructions (Signed)
Vaginal Bleeding During Pregnancy, Second Trimester  A small amount of bleeding (spotting) from the vagina is common during pregnancy. Sometimes the bleeding is normal and is not a sign of problems. In some other cases, it is a sign of something serious. Tell your doctor right away if there is any bleeding from your vagina. Follow these instructions at home: Activity  Follow your doctor's instructions about how active you can be.  If needed, make plans for someone to help with your normal activities.  Do not exercise or do activities that take a lot of effort until your doctor says that this is safe.  Do not lift anything that is heavier than 10 lb (4.5 kg) until your doctor says that this is safe.  Do not have sex or orgasms until your doctor says that this is safe. Medicines  Take over-the-counter and prescription medicines only as told by your doctor.  Do not take aspirin. It can cause bleeding. General instructions  Watch your condition for any changes.  Write down: ? The number of pads you use each day. ? How often you change pads. ? How soaked your pads are.  Do not use tampons.  Do not douche.  If you pass any tissue from your vagina, save it to show to your doctor.  Keep all follow-up visits as told by your doctor. This is important. Contact a doctor if:  You have bleeding in the vagina at any time during pregnancy.  You have cramps.  You have a fever that does not get better with medicine. Get help right away if:  You have very bad cramps in your back or belly (abdomen).  You have contractions.  You have chills.  You pass large clots or a lot of tissue from your vagina.  Your bleeding gets worse.  You feel light-headed.  You feel weak.  You pass out (faint).  You are leaking fluid from your vagina.  You have a gush of fluid from your vagina. Summary  Sometimes vaginal bleeding during pregnancy is normal and is not a problem. Sometimes it may  be a sign of something serious.  Tell your doctor about any bleeding from your vagina right away.  Follow your doctor's instructions about how active you can be. You may need someone to help you with your normal activities. This information is not intended to replace advice given to you by your health care provider. Make sure you discuss any questions you have with your health care provider. Document Revised: 05/31/2018 Document Reviewed: 05/13/2016 Elsevier Patient Education  2020 Elsevier Inc.  

## 2019-03-02 NOTE — MAU Provider Note (Addendum)
Chief Complaint: Abdominal Pain  SUBJECTIVE HPI: Patricia Irwin is a 29 y.o. G2P0010 at [redacted]w[redacted]d by LMP who presents to maternity admissions reporting abdominal pain and vaginal bleeding. Patient noticed some vaginal spotting and cramping last night following IC. She says the bleeding stopped after 1-1.5 hours but that the cramping continued until this morning. She describes the cramping pain as fluctuating between a 6 and 8 out of 10. She was worried about the cramping given her history of similar pain during miscarriage. She denies vaginal itching/burning, urinary symptoms, h/a, dizziness, n/v, or fever/chills.   Past Medical History:  Diagnosis Date  . Anxiety   . Chronic headache   . Chronic knee pain   . Hx of electroencephalogram 12/2013 ([facility])   normal, dx non-epileptic seizures  . Hypoglycemia   . Noncompliance with medications   . Pseudoseizures   . PTSD (post-traumatic stress disorder)   . Seizures ([place])   . Vaginal Pap smear, abnormal    Past Surgical History:  Procedure Laterality Date  . NO PAST SURGERIES    . WISDOM TOOTH EXTRACTION     Social History   Socioeconomic History  . Marital status: Legally Separated    Spouse name: Not on file  . Number of children: Not on file  . Years of education: Not on file  . Highest education level: High school graduate  Occupational History  . Not on file  Tobacco Use  . Smoking status: Current Every Day Smoker    Packs/day: 0.50    Types: Cigarettes  . Smokeless tobacco: Never Used  . Tobacco comment: 3 a day  Substance and Sexual Activity  . Alcohol use: Not Currently    Comment: occasional  . Drug use: No  . Sexual activity: Yes    Birth control/protection: None  Other Topics Concern  . Not on file  Social History Narrative  . Not on file   Social Determinants of Health   Financial Resource Strain:   . Difficulty of Paying Living Expenses: Not on file  Food Insecurity:   . Worried About Charity fundraiser in  the Last Year: Not on file  . Ran Out of Food in the Last Year: Not on file  Transportation Needs:   . Lack of Transportation (Medical): Not on file  . Lack of Transportation (Non-Medical): Not on file  Physical Activity:   . Days of Exercise per Week: Not on file  . Minutes of Exercise per Session: Not on file  Stress:   . Feeling of Stress : Not on file  Social Connections:   . Frequency of Communication with Friends and Family: Not on file  . Frequency of Social Gatherings with Friends and Family: Not on file  . Attends Religious Services: Not on file  . Active Member of Clubs or Organizations: Not on file  . Attends Archivist Meetings: Not on file  . Marital Status: Not on file  Intimate Partner Violence:   . Fear of Current or Ex-Partner: Not on file  . Emotionally Abused: Not on file  . Physically Abused: Not on file  . Sexually Abused: Not on file   No current facility-administered medications on file prior to encounter.   Current Outpatient Medications on File Prior to Encounter  Medication Sig Dispense Refill  . FOLIC ACID PO Take by mouth daily.    Marland Kitchen gabapentin (NEURONTIN) 300 MG capsule Take 300 mg by mouth 3 (three) times daily.     Marland Kitchen levETIRAcetam (  KEPPRA XR) 500 MG 24 hr tablet Take 2 tablets (1,000 mg total) by mouth daily. (Patient taking differently: Take 3,000 mg by mouth daily. ) 60 tablet 0  . Prenatal Vit-Fe Fumarate-FA (PRENATAL VITAMIN PO) Take by mouth 2 (two) times daily.    . Blood Pressure Monitor MISC For regular home bp monitoring during pregnancy 1 each 0  . escitalopram (LEXAPRO) 20 MG tablet Take 20 mg by mouth daily.      Allergies  Allergen Reactions  . Dilantin [Phenytoin] Hives  . Bee Venom   . Celexa [Citalopram Hydrobromide] Other (See Comments)    Suicidal ideation  . Cinnamon Swelling  . Influenza Vaccines Other (See Comments)    Cough  . Metronidazole Hives and Nausea Only  . Tape   . Latex Hives and Rash  . Trintellix  [Vortioxetine] Rash    ROS:  Review of Systems  Constitutional: Negative for fatigue and fever.  Respiratory: Negative for cough and shortness of breath.   Cardiovascular: Negative for chest pain.  Gastrointestinal: Positive for abdominal pain and constipation. Negative for nausea and vomiting.  Genitourinary: Negative for dysuria and frequency.   I have reviewed patient's Past Medical Hx, Surgical Hx, Family Hx, Social Hx, medications and allergies.   Physical Exam   Patient Vitals for the past 24 hrs:  BP Temp Temp src Pulse Resp SpO2 Height Weight  03/02/19 1526 118/69 98 F (36.7 C) Oral 81 18 100 % -- --  03/02/19 1200 (!) 129/94 98.2 F (36.8 C) Oral (!) 103 16 99 % -- --  03/02/19 1156 -- -- -- -- -- -- 5\' 3"  (1.6 m) 77.1 kg   Constitutional: Well-developed, well-nourished female in no acute distress.  Cardiovascular: normal rate Respiratory: normal effort GI: Abd soft, non-tender MS: Extremities nontender, no edema, normal ROM Neurologic: Alert and oriented x 4.  GU: Neg CVAT.  PELVIC EXAM: Cervix pink, visually closed, without lesion, scant white creamy discharge, vaginal walls and external genitalia normal Bimanual exam: Cervix 0/long/high, firm, anterior, neg CMT, uterus nontender, nonenlarged, adnexa without tenderness, enlargement, or mass  FHT 145 by doppler  LAB RESULTS Results for orders placed or performed during the hospital encounter of 03/02/19 (from the past 24 hour(s))  Wet prep, genital     Status: Abnormal   Collection Time: 03/02/19  2:00 PM   Specimen: Cervix  Result Value Ref Range   Yeast Wet Prep HPF POC PRESENT (A) NONE SEEN   Trich, Wet Prep NONE SEEN NONE SEEN   Clue Cells Wet Prep HPF POC NONE SEEN NONE SEEN   WBC, Wet Prep HPF POC MANY (A) NONE SEEN   Sperm NONE SEEN   Urinalysis, Routine w reflex microscopic     Status: Abnormal   Collection Time: 03/02/19  2:14 PM  Result Value Ref Range   Color, Urine YELLOW YELLOW   APPearance  HAZY (A) CLEAR   Specific Gravity, Urine 1.026 1.005 - 1.030   pH 6.0 5.0 - 8.0   Glucose, UA NEGATIVE NEGATIVE mg/dL   Hgb urine dipstick MODERATE (A) NEGATIVE   Bilirubin Urine NEGATIVE NEGATIVE   Ketones, ur 5 (A) NEGATIVE mg/dL   Protein, ur 30 (A) NEGATIVE mg/dL   Nitrite NEGATIVE NEGATIVE   Leukocytes,Ua NEGATIVE NEGATIVE   RBC / HPF 11-20 0 - 5 RBC/hpf   WBC, UA 0-5 0 - 5 WBC/hpf   Bacteria, UA NONE SEEN NONE SEEN   Squamous Epithelial / LPF 0-5 0 - 5   Mucus  PRESENT     O/Positive/-- (12/14 1631)  IMAGING No results found.  MAU Management/MDM: Orders Placed This Encounter  Procedures  . Wet prep, genital  . [place] MFM OB Limited  . Urinalysis, Routine w reflex microscopic  . Discharge patient Discharge disposition: 01-Home or Self Care; Discharge patient date: 03/02/2019    No orders of the defined types were placed in this encounter.   ASSESSMENT Patricia Irwin is a 29 yo G2P0010 at [redacted]w[redacted]d presenting for vaginal bleeding and pain. Patient's symptoms are likely from uterine irritation following IC vs infection vs vasa/placenta previa given symptom timeline and normal U/S findings.   1. Vaginal bleeding before [redacted] weeks gestation     PLAN Discharge home with strict precautions to return should cramping continue or bleeding return.   Allergies as of 03/02/2019       Reactions   Dilantin [phenytoin] Hives   Bee Venom    Celexa [citalopram Hydrobromide] Other (See Comments)   Suicidal ideation   Cinnamon Swelling   Influenza Vaccines Other (See Comments)   Cough   Metronidazole Hives, Nausea Only   Tape    Latex Hives, Rash   Trintellix [vortioxetine] Rash        Medication List     TAKE these medications    Blood Pressure Monitor Misc For regular home bp monitoring during pregnancy   escitalopram 20 MG tablet Commonly known as: LEXAPRO Take 20 mg by mouth daily.   FOLIC ACID PO Take by mouth daily.   gabapentin 300 MG capsule Commonly known as:  NEURONTIN Take 300 mg by mouth 3 (three) times daily.   levETIRAcetam 500 MG 24 hr tablet Commonly known as: Keppra XR Take 2 tablets (1,000 mg total) by mouth daily. What changed: how much to take   PRENATAL VITAMIN PO Take by mouth 2 (two) times daily.      Alessandra Grout, MS3  03/02/2019  3:30 PM   I confirm that I have verified the information documented in the medical student's note and that I have also personally reperformed the history, physical exam and all medical decision making activities of this service and have verified that all service and findings are accurately documented in this student's note.   -No blood in the vagina, blood type is O positive  -US shows no placenta previa -FHR is 145 -At time of discharge, patient's pain was a 0/10 and she has had no more bleeding.  -Advised patient that her bleeding was most likely post-coital bleeding, which is common in pregnancy. Reviewed bleeding precautions and when to return to MAU.   -Keep appt at [facility] in two weeks.   Marylene Land, CNM 03/02/2019 6:17 PM

## 2019-03-03 LAB — GC/CHLAMYDIA PROBE AMP (~~LOC~~) NOT AT ARMC
Chlamydia: NEGATIVE
Comment: NEGATIVE
Comment: NORMAL
Neisseria Gonorrhea: NEGATIVE

## 2019-03-07 ENCOUNTER — Encounter: Payer: Self-pay | Admitting: Advanced Practice Midwife

## 2019-03-22 ENCOUNTER — Other Ambulatory Visit: Payer: Self-pay | Admitting: Advanced Practice Midwife

## 2019-03-22 DIAGNOSIS — Z363 Encounter for antenatal screening for malformations: Secondary | ICD-10-CM

## 2019-03-22 DIAGNOSIS — Z3482 Encounter for supervision of other normal pregnancy, second trimester: Secondary | ICD-10-CM

## 2019-03-23 ENCOUNTER — Ambulatory Visit (INDEPENDENT_AMBULATORY_CARE_PROVIDER_SITE_OTHER): Payer: Medicare Other

## 2019-03-23 ENCOUNTER — Other Ambulatory Visit: Payer: Self-pay

## 2019-03-23 DIAGNOSIS — Z363 Encounter for antenatal screening for malformations: Secondary | ICD-10-CM

## 2019-03-23 DIAGNOSIS — Z3A19 19 weeks gestation of pregnancy: Secondary | ICD-10-CM

## 2019-03-23 DIAGNOSIS — Z3482 Encounter for supervision of other normal pregnancy, second trimester: Secondary | ICD-10-CM

## 2019-03-23 NOTE — Progress Notes (Signed)
[place] 19 wks,breech,cx 4.2 cm,anterior placenta gr 0,normal left ovary,simple right ovarian cyst 6.4 x 3 x 3.9 cm,svp of fluid 5.4 cm,fhr 154 bpm,efw 290 g 68%,anatomy complete,no obvious abnormalities

## 2019-04-06 ENCOUNTER — Other Ambulatory Visit: Payer: Self-pay

## 2019-04-06 ENCOUNTER — Encounter: Payer: Self-pay | Admitting: Obstetrics and Gynecology

## 2019-04-06 ENCOUNTER — Ambulatory Visit (INDEPENDENT_AMBULATORY_CARE_PROVIDER_SITE_OTHER): Payer: Medicare Other | Admitting: Obstetrics and Gynecology

## 2019-04-06 VITALS — BP 119/80 | HR 76 | Wt 169.6 lb

## 2019-04-06 DIAGNOSIS — Z1389 Encounter for screening for other disorder: Secondary | ICD-10-CM

## 2019-04-06 DIAGNOSIS — Z331 Pregnant state, incidental: Secondary | ICD-10-CM

## 2019-04-06 DIAGNOSIS — Z3A21 21 weeks gestation of pregnancy: Secondary | ICD-10-CM

## 2019-04-06 DIAGNOSIS — Z3482 Encounter for supervision of other normal pregnancy, second trimester: Secondary | ICD-10-CM

## 2019-04-06 LAB — POCT URINALYSIS DIPSTICK OB
Glucose, UA: NEGATIVE
Ketones, UA: NEGATIVE
Leukocytes, UA: NEGATIVE
Nitrite, UA: NEGATIVE
POC,PROTEIN,UA: NEGATIVE

## 2019-04-06 NOTE — Progress Notes (Signed)
Patient ID: Patricia Irwin, female   DOB: [birth year], 29 y.o.   MRN: [id number]    LOW-RISK PREGNANCY VISIT Patient name: Patricia Irwin MRN [id number]  Date of birth: January 23, 1991 Chief Complaint:   Routine Prenatal Visit  History of Present Illness:   Patricia Irwin is a 29 y.o. G30P0010 female at [redacted]w[redacted]d with an Estimated Date of Delivery: 08/17/19 being seen today for ongoing management of a low-risk pregnancy.  Today she reports no complaints. Contractions: Not present. Vag. Bleeding: None.  Movement: Present. denies leaking of fluid. Review of Systems:   Pertinent items are noted in HPI Denies abnormal vaginal discharge w/ itching/odor/irritation, headaches, visual changes, shortness of breath, chest pain, abdominal pain, severe nausea/vomiting, or problems with urination or bowel movements unless otherwise stated above. Pertinent History Reviewed:  Reviewed past medical,surgical, social, obstetrical and family history.  Reviewed problem list, medications and allergies. Physical Assessment:   Vitals:   04/06/19 1540  BP: 119/80  Pulse: 76  Weight: 169 lb 9.6 oz (76.9 kg)  Body mass index is 30.04 kg/m.        Physical Examination:   General appearance: Well appearing, and in no distress  Mental status: Alert, oriented to person, place, and time  Skin: Warm & dry  Cardiovascular: Normal heart rate noted  Respiratory: Normal respiratory effort, no distress  Abdomen: Soft, gravid, nontender fundal height U+ 2, 23cm  Pelvic: Cervical exam deferred         Extremities: Edema: Trace  Fetal Status: Fetal Heart Rate (bpm): 148   Movement: Present    Chaperone: Arnette Norris    Results for orders placed or performed in visit on 04/06/19 (from the past 24 hour(s))  POC Urinalysis Dipstick OB   Collection Time: 04/06/19  3:37 PM  Result Value Ref Range   Color, UA     Clarity, UA     Glucose, UA Negative Negative   Bilirubin, UA     Ketones, UA neg    Spec Grav, UA     Blood, UA  trace    pH, UA     POC,PROTEIN,UA Negative Negative, Trace, Small (1+), Moderate (2+), Large (3+), 4+   Urobilinogen, UA     Nitrite, UA neg    Leukocytes, UA Negative Negative   Appearance     Odor      Assessment & Plan:  1) Low-risk pregnancy G2P0010 at [redacted]w[redacted]d with an Estimated Date of Delivery: 08/17/19    Meds: No orders of the defined types were placed in this encounter.  Labs/procedures today: None  Plan:  Continue routine obstetrical care  Next visit: 4 weeks tele visit online    Follow-up: Return in about 4 weeks (around 05/04/2019) for Televisit.  Orders Placed This Encounter  Procedures  . POC Urinalysis Dipstick OB   By signing my name below, I, Arnette Norris, attest that this documentation has been prepared under the direction and in the presence of Tilda Burrow, MD. Electronically Signed: Arnette Norris Medical Scribe. 04/06/19. 3:52 PM.  I personally performed the services described in this documentation, which was SCRIBED in my presence. The recorded information has been reviewed and considered accurate. It has been edited as necessary during review. Tilda Burrow, MD

## 2019-04-06 NOTE — Patient Instructions (Signed)
[facility] [place] °Call to Register: [phone] or [phone]   or   Register Online: [email or web address] °THESE CLASSES FILL UP VERY QUICKLY, SO SIGN UP AS SOON AS YOU CAN!!! °Please visit [facility] pregnancy website at [email or web address] ° °Childbirth Classes  °Option 1: Birth & Baby Series °? Series of 3 weekly classes, on the same day of the week (can choose Mon-Thurs) from 6-9pm °? Helps you and your support person prepare for childbirth °? Reviews newborn care, labor & birth, cesarean birth, pain management, and comfort techniques °? Cost: $60 per couple for insured or self-pay, $30 per couple for Medicaid ° °Option 2: Weekend Birth & Baby °? This class is a weekend version of our Birth & Baby series.  It is designed for parents who have a difficult time fitting several weeks of classes into their schedule.   °? Covers the care of your newborn and the basics of labor and childbirth °? Friday 6:30pm-8:30pm Saturday 9am-4pm, includes lunch for you and your partner  °? Cost: $75 per couple for insured or self-pay, $30 per couple for Medicaid ° °Option 3: Natural Childbirth °? This series of 5 weekly classes is for expectant parents who want to learn and practice natural methods of coping with the process of labor and childbirth.  Can choose Mon or Tues, 7-9pm.   °? Covers relaxation, breathing, massage, visualization, role of the partner, and helpful positioning °? Participants learn how to be confident in their body's ability to give birth. Class empowers and helps parents make informed decisions about care. Includes discussion that will help new parents transition into the immediate postpartum period.  °? Cost: $75 per couple for insured or self-pay, $30 per couple for Medicaid ° °Option 4: Online Birth & Baby °? This online class offers you the freedom to complete a Birth & Baby series in the comfort of your own home.  The flexibility of this option allows you to review  sections at your own pace, at times convenient to you and your support people.  It includes additional video information, animations, quizzes and extended activities. Get organized with helpful eClass tools, checklists, and trackers.  °? Cost: $60 for 60 days of online access °    °                                                                       °Other Available Classes ° °Baby & Me °Enjoy this time to discuss newborn & infant parenting topics and family adjustment issues with other new mothers in a relaxed environment. Each week brings a new speaker or baby-centered activity. We encourage mothers and their babies (birth to crawling) to join us. You are welcome to visit this group even if you haven't delivered yet! It's wonderful to make new friends early and watch other moms interact with their babies. No registration or fee.  °Big Brother/Big Sister °Let your children share in the joy of a new brother or sister in this special class designed just for them. Discussion includes how families care for babies: swaddling, holding, diapering, safety, as well as how they can be helpful in their new role. This class is designed for children ages 2 to 6, but any age is   welcome. Please register each child individually. $5 °Breastfeeding Support Group °This group is a mother-to-mother support circle where moms have the opportunity to share their breastfeeding experiences. A Breastfeeding Support nurse is present for questions and concerns. An infant scale is available for weight checks. No fee or registration.  °Breastfeeding Your Baby °Breastfeeding is a special time for mother and child. This class will help you feel ready to begin this important relationship. Your partner is encouraged to attend with you. Learn what to expect and feel more confident in the first days of breastfeeding your newborn. This class also addresses the most common fears and challenges of breastfeeding during the first few weeks, months, and  beyond. $30 per couple °Caring for Baby °This class is for expectant and adoptive parents who want to learn and practice the most up-to-date newborn care for their babies. Focus is on birth through first six weeks of life. Topics include feeding, bathing, diapering, crying, umbilical cord care, circumcision care and safe sleep. Parents learn how to recognize symptoms of illness and when to call the pediatrician. Register only the mom-to-be and your partner can plan to come with you. (*Note: This class is included in the Birth & Baby series and the Weekend Birth & Baby classes.) $10 per couple °Comfort Techniques & Tour °This 2-hour interactive class is designed for those who either do not wish to take the Birth & Baby series or for those who prefer our online childbirth class, but don't want to miss the opportunity to learn and practice hands-on techniques. These skills can help relieve some of the discomfort of labor and encourage your baby to rotate toward the best position for birth. You and your partner will be able to try a variety of labor positions with birth balls and rebozos as well as practice breathing, relaxation, and visual techniques. $20 per couple °Daddy Boot Camp °This course offers Dads-to-be the tools and knowledge needed to feel confident on their journey to becoming new fathers. Experienced dads, who have been trained as coaches, teach dads-to-be how to hold, comfort, diapers, swaddle and play with their infant while being able to support the new mom as well. $25 °Grandparent Love °Expecting a grandbaby? Learn about the latest infant care and safety recommendations and ways to support your own child as he or she transitions into the parenting role. $10 per person °Infant and Child CPR °Parents, grandparents, babysitters, and friends learn Cardio-Pulmonary Resuscitation skills for infants and children. You will also learn how to treat both conscious and unconscious choking infants and children.  Register each participant individually. (Note: This Family & Friends program does not offer certification.) $20 per person °Marvelous Multiples °Expecting twins, triplets, or more? This free 2-hour class covers the differences in labor, birth, parenting, and breastfeeding issues that face multiples' parents.  °Maternity Care Center Virtual Tour ° Online virtual tour of the new [facility] [place] ° °Mom Talk °This free mom-led group offers support and connection to mothers as they journey through the adjustments and struggles of that sometimes overwhelming first year after the birth of a child. A member of our staff will be present to share resources and additional support if needed, as you care for yourself and baby. You are welcome to visit this group before you deliver! It's wonderful to meet new friends early and watch other moms interact with their babies.  °Waterbirth Class °Interested in a waterbirth? This free informational class will help you discover whether   waterbirth is the right fit for you and is required if you are planning a waterbirth. Education about waterbirth itself, supplies you may need, and what you may need from your support team is included in this class. Partners are encouraged to come. °  ° °

## 2019-05-04 ENCOUNTER — Telehealth (INDEPENDENT_AMBULATORY_CARE_PROVIDER_SITE_OTHER): Payer: Medicare Other | Admitting: Advanced Practice Midwife

## 2019-05-04 ENCOUNTER — Other Ambulatory Visit: Payer: Self-pay

## 2019-05-04 VITALS — BP 108/82 | HR 90

## 2019-05-04 DIAGNOSIS — Z3A25 25 weeks gestation of pregnancy: Secondary | ICD-10-CM

## 2019-05-04 DIAGNOSIS — Z3482 Encounter for supervision of other normal pregnancy, second trimester: Secondary | ICD-10-CM

## 2019-05-04 NOTE — Patient Instructions (Signed)

## 2019-05-04 NOTE — Progress Notes (Signed)
   TELEHEALTH VIRTUAL OBSTETRICS VISIT ENCOUNTER NOTE  I connected with Patricia Irwin on 05/04/19 at 11:10 AM EST by telephone at home and verified that I am speaking with the correct person using two identifiers.   I discussed the limitations, risks, security and privacy concerns of performing an evaluation and management service by telephone and the availability of in person appointments. I also discussed with the patient that there may be a patient responsible charge related to this service. The patient expressed understanding and agreed to proceed.  Subjective:  Patricia Irwin is a 29 y.o. G2P0010 at [redacted]w[redacted]d being followed for ongoing prenatal care.  She is currently monitored for the following issues for this low-risk pregnancy and has History of ovarian cyst; History of seizures; History of posttraumatic stress disorder (PTSD); Supervision of normal pregnancy; Smoker; and Anxiety on their problem list.  Patient reports nighttime postnasal drip.  Can try benadryl hs. Reports fetal movement. Denies any contractions, bleeding or leaking of fluid.   The following portions of the patient's history were reviewed and updated as appropriate: allergies, current medications, past family history, past medical history, past social history, past surgical history and problem list.   Objective:   General:  Alert, oriented and cooperative.   Mental Status: Normal mood and affect perceived. Normal judgment and thought content.  Rest of physical exam deferred due to type of encounter  Assessment and Plan:  Pregnancy: G2P0010 at [redacted]w[redacted]d  Preterm labor symptoms and general obstetric precautions including but not limited to vaginal bleeding, contractions, leaking of fluid and fetal movement were reviewed in detail with the patient.  I discussed the assessment and treatment plan with the patient. The patient was provided an opportunity to ask questions and all were answered. The patient agreed with the plan and  demonstrated an understanding of the instructions. The patient was advised to call back or seek an in-person office evaluation/go to MAU at [facility] for any urgent or concerning symptoms. Please refer to After Visit Summary for other counseling recommendations.   I provided 10 minutes of non-face-to-face time during this encounter.  Return in about 3 weeks (around 05/25/2019) for PN2/LROB.  No future appointments.  Jacklyn Shell, CNM Center for [employer], [facility]

## 2019-05-25 ENCOUNTER — Encounter: Payer: Medicare Other | Admitting: Women's Health

## 2019-05-25 ENCOUNTER — Other Ambulatory Visit: Payer: Medicare Other

## 2019-06-01 ENCOUNTER — Ambulatory Visit (INDEPENDENT_AMBULATORY_CARE_PROVIDER_SITE_OTHER): Payer: Medicare Other | Admitting: Advanced Practice Midwife

## 2019-06-01 ENCOUNTER — Other Ambulatory Visit: Payer: Medicare Other

## 2019-06-01 ENCOUNTER — Encounter: Payer: Self-pay | Admitting: Advanced Practice Midwife

## 2019-06-01 ENCOUNTER — Other Ambulatory Visit: Payer: Self-pay

## 2019-06-01 VITALS — BP 116/78 | HR 83 | Wt 171.5 lb

## 2019-06-01 DIAGNOSIS — Z3480 Encounter for supervision of other normal pregnancy, unspecified trimester: Secondary | ICD-10-CM

## 2019-06-01 DIAGNOSIS — Z3483 Encounter for supervision of other normal pregnancy, third trimester: Secondary | ICD-10-CM

## 2019-06-01 DIAGNOSIS — Z1389 Encounter for screening for other disorder: Secondary | ICD-10-CM

## 2019-06-01 DIAGNOSIS — Z3A29 29 weeks gestation of pregnancy: Secondary | ICD-10-CM

## 2019-06-01 DIAGNOSIS — Z331 Pregnant state, incidental: Secondary | ICD-10-CM

## 2019-06-01 LAB — POCT URINALYSIS DIPSTICK OB
Glucose, UA: NEGATIVE
Ketones, UA: NEGATIVE
Leukocytes, UA: NEGATIVE
Nitrite, UA: NEGATIVE

## 2019-06-01 NOTE — Patient Instructions (Signed)
Patricia Irwin, I greatly value your feedback.  If you receive a survey following your visit with [place] today, we appreciate you taking the time to fill it out.  Thanks, Cathie Beams, DNP, CNM  [facility] HAS MOVED!!! It is now [facility] ([address] [place], [place] [address]) Entrance located off of [address] Free 24/7 valet parking   Go to [email or web address] to register for FREE online childbirth classes    Call the office [phone]) or go to [facility] if:  You begin to have strong, frequent contractions  Your water breaks.  Sometimes it is a big gush of fluid, sometimes it is just a trickle that keeps getting your panties wet or running down your legs  You have vaginal bleeding.  It is normal to have a small amount of spotting if your cervix was checked.   You don't feel your baby moving like normal.  If you don't, get you something to eat and drink and lay down and focus on feeling your baby move.  You should feel at least 10 movements in 2 hours.  If you don't, you should call the office or go to [facility].   Home Blood Pressure Monitoring for Patients   Your provider has recommended that you check your blood pressure (BP) at least once a week at home. If you do not have a blood pressure cuff at home, one will be provided for you. Contact your provider if you have not received your monitor within 1 week.   Helpful Tips for Accurate Home Blood Pressure Checks  . Don't smoke, exercise, or drink caffeine 30 minutes before checking your BP . Use the restroom before checking your BP (a full bladder can raise your pressure) . Relax in a comfortable upright chair . Feet on the ground . Left arm resting comfortably on a flat surface at the level of your heart . Legs uncrossed . Back supported . Sit quietly and don't talk . Place the cuff on your bare arm . Adjust snuggly, so that only two fingertips can fit  between your skin and the top of the cuff . Check 2 readings separated by at least one minute . Keep a log of your BP readings . For a visual, please reference this diagram: http://ccnc.care/bpdiagram  Provider Name: [facility]     Phone: [phone]  Zone 1: ALL CLEAR  Continue to monitor your symptoms:  . BP reading is less than 140 (top number) or less than 90 (bottom number)  . No right upper stomach pain . No headaches or seeing spots . No feeling nauseated or throwing up . No swelling in face and hands  Zone 2: CAUTION Call your doctor's office for any of the following:  . BP reading is greater than 140 (top number) or greater than 90 (bottom number)  . Stomach pain under your ribs in the middle or right side . Headaches or seeing spots . Feeling nauseated or throwing up . Swelling in face and hands  Zone 3: EMERGENCY  Seek immediate medical care if you have any of the following:  . BP reading is greater than160 (top number) or greater than 110 (bottom number) . Severe headaches not improving with Tylenol . Serious difficulty catching your breath . Any worsening symptoms from Zone 2

## 2019-06-01 NOTE — Progress Notes (Signed)
   LOW-RISK PREGNANCY VISIT Patient name: Patricia Irwin MRN [id number]  Date of birth: 12-Jul-1990 Chief Complaint:   Routine Prenatal Visit (PN2 today)  History of Present Illness:   Patricia Irwin is a 29 y.o. G53P0010 female at [redacted]w[redacted]d with an Estimated Date of Delivery: 08/17/19 being seen today for ongoing management of a low-risk pregnancy.  Today she reports still gets full easily. Contractions: Irregular. Vag. Bleeding: None.  Movement: Present. denies leaking of fluid. Review of Systems:   Pertinent items are noted in HPI Denies abnormal vaginal discharge w/ itching/odor/irritation, headaches, visual changes, shortness of breath, chest pain, abdominal pain, severe nausea/vomiting, or problems with urination or bowel movements unless otherwise stated above. Pertinent History Reviewed:  Reviewed past medical,surgical, social, obstetrical and family history.  Reviewed problem list, medications and allergies. Physical Assessment:   Vitals:   06/01/19 0920  BP: 116/78  Pulse: 83  Weight: 171 lb 8 oz (77.8 kg)  Body mass index is 30.38 kg/m.        Physical Examination:   General appearance: Well appearing, and in no distress  Mental status: Alert, oriented to person, place, and time  Skin: Warm & dry  Cardiovascular: Normal heart rate noted  Respiratory: Normal respiratory effort, no distress  Abdomen: Soft, gravid, nontender  Pelvic: Cervical exam deferred         Extremities: Edema: Trace  Fetal Status:     Movement: Present    Chaperone: n/a    No results found for this or any previous visit (from the past 24 hour(s)).  Assessment & Plan:  1) Low-risk pregnancy G2P0010 at [redacted]w[redacted]d with an Estimated Date of Delivery: 08/17/19     Meds: No orders of the defined types were placed in this encounter.  Labs/procedures today: PN2  Plan:  Continue routine obstetrical care  Next visit: prefers online    Reviewed: Preterm labor symptoms and general obstetric precautions including  but not limited to vaginal blHaseedin, contractions, leaking of fluid and fetal movement were reviewed in detail with the patient.  All questions were answered. has home bp cuff.. Check bp weekly, let us know if >140/90. 2 Follow-up: Return in about 3 weeks (around 06/22/2019) for OB Mychart visit.  Orders Placed This Encounter  Procedures  . POC Urinalysis Dipstick OB   Jacklyn Shell DNP, CNM 06/01/2019 9:42 AM

## 2019-06-02 LAB — GLUCOSE TOLERANCE, 2 HOURS W/ 1HR
Glucose, 1 hour: 145 mg/dL (ref 65–179)
Glucose, 2 hour: 106 mg/dL (ref 65–152)
Glucose, Fasting: 75 mg/dL (ref 65–91)

## 2019-06-02 LAB — ANTIBODY SCREEN: Antibody Screen: NEGATIVE

## 2019-06-02 LAB — CBC
Hematocrit: 37.9 % (ref 34.0–46.6)
Hemoglobin: 12.2 g/dL (ref 11.1–15.9)
MCH: 26.5 pg — ABNORMAL LOW (ref 26.6–33.0)
MCHC: 32.2 g/dL (ref 31.5–35.7)
MCV: 82 fL (ref 79–97)
Platelets: 524 10*3/uL — ABNORMAL HIGH (ref 150–450)
RBC: 4.6 x10E6/uL (ref 3.77–5.28)
RDW: 13.4 % (ref 11.7–15.4)
WBC: 16.2 10*3/uL — ABNORMAL HIGH (ref 3.4–10.8)

## 2019-06-02 LAB — HIV ANTIBODY (ROUTINE TESTING W REFLEX): HIV Screen 4th Generation wRfx: NONREACTIVE

## 2019-06-02 LAB — RPR: RPR Ser Ql: NONREACTIVE

## 2019-06-22 ENCOUNTER — Other Ambulatory Visit: Payer: Self-pay

## 2019-06-22 ENCOUNTER — Telehealth (INDEPENDENT_AMBULATORY_CARE_PROVIDER_SITE_OTHER): Payer: Medicare Other | Admitting: Advanced Practice Midwife

## 2019-06-22 VITALS — BP 125/88

## 2019-06-22 DIAGNOSIS — Z8659 Personal history of other mental and behavioral disorders: Secondary | ICD-10-CM

## 2019-06-22 DIAGNOSIS — Z8669 Personal history of other diseases of the nervous system and sense organs: Secondary | ICD-10-CM | POA: Diagnosis not present

## 2019-06-22 DIAGNOSIS — O99333 Smoking (tobacco) complicating pregnancy, third trimester: Secondary | ICD-10-CM | POA: Diagnosis not present

## 2019-06-22 DIAGNOSIS — Z3A32 32 weeks gestation of pregnancy: Secondary | ICD-10-CM

## 2019-06-22 DIAGNOSIS — Z3483 Encounter for supervision of other normal pregnancy, third trimester: Secondary | ICD-10-CM

## 2019-06-22 NOTE — Progress Notes (Signed)
   TELEHEALTH VIRTUAL OBSTETRICS VISIT ENCOUNTER NOTE  I connected with Patricia Irwin on 06/22/19 at  3:30 PM EDT by telephone at home and verified that I am speaking with the correct person using two identifiers.   I discussed the limitations, risks, security and privacy concerns of performing an evaluation and management service by telephone and the availability of in person appointments. I also discussed with the patient that there may be a patient responsible charge related to this service. The patient expressed understanding and agreed to proceed.  Subjective:  Patricia Irwin is a 29 y.o. G2P0010 at [redacted]w[redacted]d being followed for ongoing prenatal care.  She is currently monitored for the following issues for this low-risk pregnancy and has History of ovarian cyst; History of seizures; History of posttraumatic stress disorder (PTSD); Supervision of normal pregnancy; Smoker; and Anxiety on their problem list.  Patient reports scratchy throat/stuffy nose since last night. Had C-19 test today, awaiting results.  If neg and still sx, retest in 4-5 days. . Reports fetal movement. Denies any contractions, bleeding or leaking of fluid.   The following portions of the patient's history were reviewed and updated as appropriate: allergies, current medications, past family history, past medical history, past social history, past surgical history and problem list.   Objective:   General:  Alert, oriented and cooperative.   Mental Status: Normal mood and affect perceived. Normal judgment and thought content.  Rest of physical exam deferred due to type of encounter  Assessment and Plan:  Pregnancy: G2P0010 at [redacted]w[redacted]d There are no diagnoses linked to this encounter. Preterm labor symptoms and general obstetric precautions including but not limited to vaginal bleeding, contractions, leaking of fluid and fetal movement were reviewed in detail with the patient.  I discussed the assessment and treatment plan with  the patient. The patient was provided an opportunity to ask questions and all were answered. The patient agreed with the plan and demonstrated an understanding of the instructions. The patient was advised to call back or seek an in-person office evaluation/go to MAU at [facility] for any urgent or concerning symptoms. Please refer to After Visit Summary for other counseling recommendations.   I provided 10 minutes of non-face-to-face time during this encounter.  Return in about 2 weeks (around 07/06/2019) for LROB.  No future appointments.  Jacklyn Shell, CNM [facility] [employer], [facility]

## 2019-06-22 NOTE — Patient Instructions (Addendum)
Patricia Irwin, I greatly value your feedback.  If you receive a survey following your visit with [place] today, we appreciate you taking the time to fill it out.  Thanks, Cathie Beams, DNP, CNM  [facility] HAS MOVED!!! It is now [facility] at [facility] ([address] [place], [place] [address]) Entrance located off of [address] Free 24/7 valet parking   Go to [email or web address] to register for FREE online childbirth classes    Call the office [phone]) or go to [facility] if:  You begin to have strong, frequent contractions  Your water breaks.  Sometimes it is a big gush of fluid, sometimes it is just a trickle that keeps getting your panties wet or running down your legs  You have vaginal bleeding.  It is normal to have a small amount of spotting if your cervix was checked.   You don't feel your baby moving like normal.  If you don't, get you something to eat and drink and lay down and focus on feeling your baby move.  You should feel at least 10 movements in 2 hours.  If you don't, you should call the office or go to [facility].   Home Blood Pressure Monitoring for Patients   Your provider has recommended that you check your blood pressure (BP) at least once a week at home. If you do not have a blood pressure cuff at home, one will be provided for you. Contact your provider if you have not received your monitor within 1 week.   Helpful Tips for Accurate Home Blood Pressure Checks  . Don't smoke, exercise, or drink caffeine 30 minutes before checking your BP . Use the restroom before checking your BP (a full bladder can raise your pressure) . Relax in a comfortable upright chair . Feet on the ground . Left arm resting comfortably on a flat surface at the level of your heart . Legs uncrossed . Back supported . Sit quietly and don't talk . Place the cuff on your bare arm . Adjust snuggly, so that only two fingertips can fit  between your skin and the top of the cuff . Check 2 readings separated by at least one minute . Keep a log of your BP readings . For a visual, please reference this diagram: http://ccnc.care/bpdiagram  Provider Name: [facility]     Phone: [phone]  Zone 1: ALL CLEAR  Continue to monitor your symptoms:  . BP reading is less than 140 (top number) or less than 90 (bottom number)  . No right upper stomach pain . No headaches or seeing spots . No feeling nauseated or throwing up . No swelling in face and hands  Zone 2: CAUTION Call your doctor's office for any of the following:  . BP reading is greater than 140 (top number) or greater than 90 (bottom number)  . Stomach pain under your ribs in the middle or right side . Headaches or seeing spots . Feeling nauseated or throwing up . Swelling in face and hands  Zone 3: EMERGENCY  Seek immediate medical care if you have any of the following:  . BP reading is greater than160 (top number) or greater than 110 (bottom number) . Severe headaches not improving with Tylenol . Serious difficulty catching your breath . Any worsening symptoms from Zone 2    Safe Medications in Pregnancy   Acne: Benzoyl Peroxide Salicylic Acid  Backache/Headache: Tylenol: 2 regular strength every 4  hours OR              2 Extra strength every 6 hours  Colds/Coughs/Allergies: Benadryl (alcohol free) 25 mg every 6 hours as needed Breath right strips Claritin Cepacol throat lozenges Chloraseptic throat spray Cold-Eeze- up to three times per day Cough drops, alcohol free Flonase (by prescription only) Guaifenesin Mucinex Robitussin DM (plain only, alcohol free) Saline nasal spray/drops Sudafed (pseudoephedrine) & Actifed ** use only after [redacted] weeks gestation and if you do not have high blood pressure Tylenol Vicks Vaporub Zinc lozenges Zyrtec   Constipation: Colace Ducolax suppositories Fleet enema Glycerin  suppositories Metamucil Milk of magnesia Miralax Senokot Smooth move tea  Diarrhea: Kaopectate Imodium A-D  *NO pepto Bismol  Hemorrhoids: Anusol Anusol HC Preparation H Tucks  Indigestion: Tums Maalox Mylanta Zantac  Pepcid  Insomnia: Benadryl (alcohol free) 25mg  every 6 hours as needed Tylenol PM Unisom, no Gelcaps  Leg Cramps: Tums MagGel  Nausea/Vomiting:  Bonine Dramamine Emetrol Ginger extract Sea bands Meclizine  Nausea medication to take during pregnancy:  Unisom (doxylamine succinate 25 mg tablets) Take one tablet daily at bedtime. If symptoms are not adequately controlled, the dose can be increased to a maximum recommended dose of two tablets daily (1/2 tablet in the morning, 1/2 tablet mid-afternoon and one at bedtime). Vitamin B6 100mg  tablets. Take one tablet twice a day (up to 200 mg per day).  Skin Rashes: Aveeno products Benadryl cream or 25mg  every 6 hours as needed Calamine Lotion 1% cortisone cream  Yeast infection: Gyne-lotrimin 7 Monistat 7   **If taking multiple medications, please check labels to avoid duplicating the same active ingredients **take medication as directed on the label ** Do not exceed 4000 mg of tylenol in 24 hours **Do not take medications that contain aspirin or ibuprofen

## 2019-07-06 ENCOUNTER — Encounter: Payer: Medicaid Other | Admitting: Women's Health

## 2019-07-10 ENCOUNTER — Other Ambulatory Visit: Payer: Self-pay

## 2019-07-10 ENCOUNTER — Ambulatory Visit (INDEPENDENT_AMBULATORY_CARE_PROVIDER_SITE_OTHER): Payer: Medicare Other | Admitting: Women's Health

## 2019-07-10 ENCOUNTER — Encounter: Payer: Self-pay | Admitting: Women's Health

## 2019-07-10 VITALS — BP 126/78 | HR 88 | Wt 175.6 lb

## 2019-07-10 DIAGNOSIS — Z23 Encounter for immunization: Secondary | ICD-10-CM | POA: Diagnosis not present

## 2019-07-10 DIAGNOSIS — Z3A34 34 weeks gestation of pregnancy: Secondary | ICD-10-CM

## 2019-07-10 DIAGNOSIS — F419 Anxiety disorder, unspecified: Secondary | ICD-10-CM

## 2019-07-10 DIAGNOSIS — Z3483 Encounter for supervision of other normal pregnancy, third trimester: Secondary | ICD-10-CM

## 2019-07-10 DIAGNOSIS — Z1389 Encounter for screening for other disorder: Secondary | ICD-10-CM

## 2019-07-10 DIAGNOSIS — Z331 Pregnant state, incidental: Secondary | ICD-10-CM

## 2019-07-10 DIAGNOSIS — O99353 Diseases of the nervous system complicating pregnancy, third trimester: Secondary | ICD-10-CM

## 2019-07-10 LAB — POCT URINALYSIS DIPSTICK OB
Blood, UA: NEGATIVE
Glucose, UA: NEGATIVE
Leukocytes, UA: NEGATIVE
Nitrite, UA: NEGATIVE
POC,PROTEIN,UA: NEGATIVE

## 2019-07-10 NOTE — Patient Instructions (Signed)
Villa L Klooster, I greatly value your feedback.  If you receive a survey following your visit with [place] today, we appreciate you taking the time to fill it out.  Thanks, [name], CNM, WHNP-BC   [facility] ([address] [place], [place] [address]) Entrance C, located off of [address] valet parking  Go to [email or web address] to register for FREE online childbirth classes   Call the office [phone]) or go to [facility] if:  You begin to have strong, frequent contractions  Your water breaks.  Sometimes it is a big gush of fluid, sometimes it is just a trickle that keeps getting your panties wet or running down your legs  You have vaginal bleeding.  It is normal to have a small amount of spotting if your cervix was checked.   You don't feel your baby moving like normal.  If you don't, get you something to eat and drink and lay down and focus on feeling your baby move.  You should feel at least 10 movements in 2 hours.  If you don't, you should call the office or go to [facility].    Tdap Vaccine  It is recommended that you get the Tdap vaccine during the third trimester of EACH pregnancy to help protect your baby from getting pertussis (whooping cough)  27-36 weeks is the BEST time to do this so that you can pass the protection on to your baby. During pregnancy is better than after pregnancy, but if you are unable to get it during pregnancy it will be offered at the hospital.   You can get this vaccine with [place], at the health department, your family doctor, or some local pharmacies  Everyone who will be around your baby should also be up-to-date on their vaccines before the baby comes. Adults (who are not pregnant) only need 1 dose of Tdap during adulthood.   [place] Pediatricians/Family Doctors:  [facility] [phone]            [facility] [phone]                 [facility]  [phone] (usually not accepting new patients unless you have family there already, you are always welcome to call and ask)       [facility] Department [phone]       [facility] Pediatricians/Family Doctors:   [facility]: [phone]  [facility]: [phone]  [facility]: [phone]  [facility] Doctors:   [facility]: [phone]   [facility]: [phone]  [facility] Doctors:  [facility]: [phone]   Home Blood Pressure Monitoring for Patients   Your provider has recommended that you check your blood pressure (BP) at least once a week at home. If you do not have a blood pressure cuff at home, one will be provided for you. Contact your provider if you have not received your monitor within 1 week.   Helpful Tips for Accurate Home Blood Pressure Checks  . Don't smoke, exercise, or drink caffeine 30 minutes before checking your BP . Use the restroom before checking your BP (a full bladder can raise your pressure) . Relax in a comfortable upright chair . Feet on the ground . Left arm resting comfortably on a flat surface at the level of your heart . Legs uncrossed . Back supported . Sit quietly and don't talk . Place the cuff on your  bare arm . Adjust snuggly, so that only two fingertips can fit between your skin and the top of the cuff . Check 2 readings separated by at least one minute . Keep a log of your BP readings . For a visual, please reference this diagram: http://ccnc.care/bpdiagram  Provider Name: [facility]     Phone: [phone]  Zone 1: ALL CLEAR  Continue to monitor your symptoms:  . BP reading is less than 140 (top number) or less than 90 (bottom number)  . No right upper stomach pain . No headaches or seeing spots . No feeling nauseated or throwing up . No swelling in face and hands  Zone 2: CAUTION Call your  doctor's office for any of the following:  . BP reading is greater than 140 (top number) or greater than 90 (bottom number)  . Stomach pain under your ribs in the middle or right side . Headaches or seeing spots . Feeling nauseated or throwing up . Swelling in face and hands  Zone 3: EMERGENCY  Seek immediate medical care if you have any of the following:  . BP reading is greater than160 (top number) or greater than 110 (bottom number) . Severe headaches not improving with Tylenol . Serious difficulty catching your breath . Any worsening symptoms from Zone 2   Third Trimester of Pregnancy The third trimester is from week 29 through week 42, months 7 through 9. The third trimester is a time when the fetus is growing rapidly. At the end of the ninth month, the fetus is about 20 inches in length and weighs 6-10 pounds.  BODY CHANGES Your body goes through many changes during pregnancy. The changes vary from woman to woman.   Your weight will continue to increase. You can expect to gain 25-35 pounds (11-16 kg) by the end of the pregnancy.  You may begin to get stretch marks on your hips, abdomen, and breasts.  You may urinate more often because the fetus is moving lower into your pelvis and pressing on your bladder.  You may develop or continue to have heartburn as a result of your pregnancy.  You may develop constipation because certain hormones are causing the muscles that push waste through your intestines to slow down.  You may develop hemorrhoids or swollen, bulging veins (varicose veins).  You may have pelvic pain because of the weight gain and pregnancy hormones relaxing your joints between the bones in your pelvis. Backaches may result from overexertion of the muscles supporting your posture.  You may have changes in your hair. These can include thickening of your hair, rapid growth, and changes in texture. Some women also have hair loss during or after pregnancy, or hair that  feels dry or thin. Your hair will most likely return to normal after your baby is born.  Your breasts will continue to grow and be tender. A yellow discharge may leak from your breasts called colostrum.  Your belly button may stick out.  You may feel short of breath because of your expanding uterus.  You may notice the fetus "dropping," or moving lower in your abdomen.  You may have a bloody mucus discharge. This usually occurs a few days to a week before labor begins.  Your cervix becomes thin and soft (effaced) near your due date. WHAT TO EXPECT AT YOUR PRENATAL EXAMS  You will have prenatal exams every 2 weeks until week 36. Then, you will have weekly prenatal exams. During a routine prenatal visit:  You will be weighed to make sure you and the fetus are growing normally.  Your blood pressure is taken.  Your abdomen will be measured to track your baby's growth.  The fetal heartbeat will be listened to.  Any test results from the previous visit will be discussed.  You may have a cervical check near your due date to see if you have effaced. At around 36 weeks, your caregiver will check your cervix. At the same time, your caregiver will also perform a test on the secretions of the vaginal tissue. This test is to determine if a type of bacteria, Group B streptococcus, is present. Your caregiver will explain this further. Your caregiver may ask you:  What your birth plan is.  How you are feeling.  If you are feeling the baby move.  If you have had any abnormal symptoms, such as leaking fluid, bleeding, severe headaches, or abdominal cramping.  If you have any questions. Other tests or screenings that may be performed during your third trimester include:  Blood tests that check for low iron levels (anemia).  Fetal testing to check the health, activity level, and growth of the fetus. Testing is done if you have certain medical conditions or if there are problems during the  pregnancy. FALSE LABOR You may feel small, irregular contractions that eventually go away. These are called Braxton Hicks contractions, or false labor. Contractions may last for hours, days, or even weeks before true labor sets in. If contractions come at regular intervals, intensify, or become painful, it is best to be seen by your caregiver.  SIGNS OF LABOR   Menstrual-like cramps.  Contractions that are 5 minutes apart or less.  Contractions that start on the top of the uterus and spread down to the lower abdomen and back.  A sense of increased pelvic pressure or back pain.  A watery or bloody mucus discharge that comes from the vagina. If you have any of these signs before the 37th week of pregnancy, call your caregiver right away. You need to go to the hospital to get checked immediately. HOME CARE INSTRUCTIONS   Avoid all smoking, herbs, alcohol, and unprescribed drugs. These chemicals affect the formation and growth of the baby.  Follow your caregiver's instructions regarding medicine use. There are medicines that are either safe or unsafe to take during pregnancy.  Exercise only as directed by your caregiver. Experiencing uterine cramps is a good sign to stop exercising.  Continue to eat regular, healthy meals.  Wear a good support bra for breast tenderness.  Do not use hot tubs, steam rooms, or saunas.  Wear your seat belt at all times when driving.  Avoid raw meat, uncooked cheese, cat litter boxes, and soil used by cats. These carry germs that can cause birth defects in the baby.  Take your prenatal vitamins.  Try taking a stool softener (if your caregiver approves) if you develop constipation. Eat more high-fiber foods, such as fresh vegetables or fruit and whole grains. Drink plenty of fluids to keep your urine clear or pale yellow.  Take warm sitz baths to soothe any pain or discomfort caused by hemorrhoids. Use hemorrhoid cream if your caregiver approves.  If you  develop varicose veins, wear support hose. Elevate your feet for 15 minutes, 3-4 times a day. Limit salt in your diet.  Avoid heavy lifting, wear low heal shoes, and practice good posture.  Rest a lot with your legs elevated if you have leg cramps or low  back pain.  Visit your dentist if you have not gone during your pregnancy. Use a soft toothbrush to brush your teeth and be gentle when you floss.  A sexual relationship may be continued unless your caregiver directs you otherwise.  Do not travel far distances unless it is absolutely necessary and only with the approval of your caregiver.  Take prenatal classes to understand, practice, and ask questions about the labor and delivery.  Make a trial run to the hospital.  Pack your hospital bag.  Prepare the baby's nursery.  Continue to go to all your prenatal visits as directed by your caregiver. SEEK MEDICAL CARE IF:  You are unsure if you are in labor or if your water has broken.  You have dizziness.  You have mild pelvic cramps, pelvic pressure, or nagging pain in your abdominal area.  You have persistent nausea, vomiting, or diarrhea.  You have a bad smelling vaginal discharge.  You have pain with urination. SEEK IMMEDIATE MEDICAL CARE IF:   You have a fever.  You are leaking fluid from your vagina.  You have spotting or bleeding from your vagina.  You have severe abdominal cramping or pain.  You have rapid weight loss or gain.  You have shortness of breath with chest pain.  You notice sudden or extreme swelling of your face, hands, ankles, feet, or legs.  You have not felt your baby move in over an hour.  You have severe headaches that do not go away with medicine.  You have vision changes. Document Released: 02/03/2001 Document Revised: 02/14/2013 Document Reviewed: 04/12/2012 [facility] Patient Information 2015 [place], [place]. This information is not intended to replace advice given to you by your health  care provider. Make sure you discuss any questions you have with your health care provider.

## 2019-07-10 NOTE — Progress Notes (Signed)
LOW-RISK PREGNANCY VISIT Patient name: Patricia Irwin MRN [id number]  Date of birth: 12/23/1990 Chief Complaint:   Routine Prenatal Visit  History of Present Illness:   Patricia Irwin is a 29 y.o. G61P0010 female at [redacted]w[redacted]d with an Estimated Date of Delivery: 08/17/19 being seen today for ongoing management of a low-risk pregnancy.  Depression screen [facility] 2/9 06/01/2019 02/06/2019 12/19/2018 11/10/2016  Decreased Interest 0 0 0 0  Down, Depressed, Hopeless 0 0 0 1  PHQ - 2 Score 0 0 0 1  Altered sleeping 0 0 - 1  Tired, decreased energy 0 2 - 1  Change in appetite 0 0 - 0  Feeling bad or failure about yourself  0 0 - 0  Trouble concentrating 0 0 - 0  Moving slowly or fidgety/restless 0 0 - 0  Suicidal thoughts 0 0 - 0  PHQ-9 Score 0 2 - 3  Difficult doing work/chores - - - Somewhat difficult    Today she reports no complaints. Last sx ~37mths ago. Contractions: Irritability. Vag. Bleeding: None.  Movement: Present. denies leaking of fluid. Review of Systems:   Pertinent items are noted in HPI Denies abnormal vaginal discharge w/ itching/odor/irritation, headaches, visual changes, shortness of breath, chest pain, abdominal pain, severe nausea/vomiting, or problems with urination or bowel movements unless otherwise stated above. Pertinent History Reviewed:  Reviewed past medical,surgical, social, obstetrical and family history.  Reviewed problem list, medications and allergies. Physical Assessment:   Vitals:   07/10/19 1524  BP: 126/78  Pulse: 88  Weight: 175 lb 9.6 oz (79.7 kg)  Body mass index is 31.11 kg/m.        Physical Examination:   General appearance: Well appearing, and in no distress  Mental status: Alert, oriented to person, place, and time  Skin: Warm & dry  Cardiovascular: Normal heart rate noted  Respiratory: Normal respiratory effort, no distress  Abdomen: Soft, gravid, nontender  Pelvic: Cervical exam deferred         Extremities: Edema: Trace  Fetal Status:  Fetal Heart Rate (bpm): 145 Fundal Height: 32 cm Movement: Present    Chaperone: n/a    Results for orders placed or performed in visit on 07/10/19 (from the past 24 hour(s))  POC Urinalysis Dipstick OB   Collection Time: 07/10/19  3:23 PM  Result Value Ref Range   Color, UA     Clarity, UA     Glucose, UA Negative Negative   Bilirubin, UA     Ketones, UA     Spec Grav, UA     Blood, UA n    pH, UA     POC,PROTEIN,UA Negative Negative, Trace, Small (1+), Moderate (2+), Large (3+), 4+   Urobilinogen, UA     Nitrite, UA n    Leukocytes, UA Negative Negative   Appearance     Odor      Assessment & Plan:  1) Low-risk pregnancy G2P0010 at [redacted]w[redacted]d with an Estimated Date of Delivery: 08/17/19   2) Seizures, stable on keppra 3,000mg  daily, last sx 31mths ago   Meds: No orders of the defined types were placed in this encounter.  Labs/procedures today: tdap  Plan:  Continue routine obstetrical care  Next visit: prefers will be in person for gbs    Reviewed: Preterm labor symptoms and general obstetric precautions including but not limited to vaginal bleeding, contractions, leaking of fluid and fetal movement were reviewed in detail with the patient.  All questions were answered.  Follow-up: Return  in about 2 weeks (around 07/24/2019) for LROB, in person, CNM.  Orders Placed This Encounter  Procedures  . Tdap vaccine greater than or equal to 7yo IM  . POC Urinalysis Dipstick OB   Cheral Marker CNM, [facility] 07/10/2019 3:41 PM

## 2019-07-25 ENCOUNTER — Other Ambulatory Visit (HOSPITAL_COMMUNITY)
Admission: RE | Admit: 2019-07-25 | Discharge: 2019-07-25 | Disposition: A | Discharge status: Home / Self Care | Payer: Medicare Other | Source: Ambulatory Visit | Attending: Obstetrics & Gynecology | Admitting: Obstetrics & Gynecology

## 2019-07-25 ENCOUNTER — Encounter: Payer: Self-pay | Admitting: Obstetrics & Gynecology

## 2019-07-25 ENCOUNTER — Ambulatory Visit (INDEPENDENT_AMBULATORY_CARE_PROVIDER_SITE_OTHER): Payer: Medicare Other | Admitting: Obstetrics & Gynecology

## 2019-07-25 VITALS — BP 137/95 | HR 97 | Wt 180.0 lb

## 2019-07-25 DIAGNOSIS — Z1389 Encounter for screening for other disorder: Secondary | ICD-10-CM

## 2019-07-25 DIAGNOSIS — Z3483 Encounter for supervision of other normal pregnancy, third trimester: Secondary | ICD-10-CM

## 2019-07-25 DIAGNOSIS — Z331 Pregnant state, incidental: Secondary | ICD-10-CM

## 2019-07-25 DIAGNOSIS — Z3A36 36 weeks gestation of pregnancy: Secondary | ICD-10-CM | POA: Insufficient documentation

## 2019-07-25 LAB — POCT URINALYSIS DIPSTICK OB
Glucose, UA: NEGATIVE
Ketones, UA: NEGATIVE
Leukocytes, UA: NEGATIVE
Nitrite, UA: NEGATIVE

## 2019-07-25 LAB — OB RESULTS CONSOLE GBS
GBS: POSITIVE
GBS: POSITIVE

## 2019-07-25 NOTE — Progress Notes (Signed)
LOW-RISK PREGNANCY VISIT Patient name: Patricia Irwin MRN [id number]  Date of birth: Jul 20, 1990 Chief Complaint:   Routine Prenatal Visit (GBS; GC/CHL; carpal tunnel; lips swelling )  History of Present Illness:   Patricia Irwin is a 29 y.o. G16P0010 female at [redacted]w[redacted]d with an Estimated Date of Delivery: 08/17/19 being seen today for ongoing management of a low-risk pregnancy.  Depression screen [facility] 2/9 06/01/2019 02/06/2019 12/19/2018 11/10/2016  Decreased Interest 0 0 0 0  Down, Depressed, Hopeless 0 0 0 1  PHQ - 2 Score 0 0 0 1  Altered sleeping 0 0 - 1  Tired, decreased energy 0 2 - 1  Change in appetite 0 0 - 0  Feeling bad or failure about yourself  0 0 - 0  Trouble concentrating 0 0 - 0  Moving slowly or fidgety/restless 0 0 - 0  Suicidal thoughts 0 0 - 0  PHQ-9 Score 0 2 - 3  Difficult doing work/chores - - - Somewhat difficult    Today she reports no complaints. Contractions: Not present. Vag. Bleeding: None.  Movement: Present. denies leaking of fluid. Review of Systems:   Pertinent items are noted in HPI Denies abnormal vaginal discharge w/ itching/odor/irritation, headaches, visual changes, shortness of breath, chest pain, abdominal pain, severe nausea/vomiting, or problems with urination or bowel movements unless otherwise stated above. Pertinent History Reviewed:  Reviewed past medical,surgical, social, obstetrical and family history.  Reviewed problem list, medications and allergies. Physical Assessment:   Vitals:   07/25/19 1603 07/25/19 1606  BP: (!) 136/99 (!) 137/95  Pulse: 92 97  Weight: 180 lb (81.6 kg)   Body mass index is 31.89 kg/m.        Physical Examination:   General appearance: Well appearing, and in no distress  Mental status: Alert, oriented to person, place, and time  Skin: Warm & dry  Cardiovascular: Normal heart rate noted  Respiratory: Normal respiratory effort, no distress  Abdomen: Soft, gravid, nontender  Pelvic: Cervical exam performed    Dilation: 1 Effacement (%): Thick Station: -2  Extremities: Edema: Trace  Fetal Status: Fetal Heart Rate (bpm): 166 Fundal Height: 34 cm Movement: Present Presentation: Vertex  Chaperone: Levy Pupa    Results for orders placed or performed in visit on 07/25/19 (from the past 24 hour(s))  POC Urinalysis Dipstick OB   Collection Time: 07/25/19  4:04 PM  Result Value Ref Range   Color, UA     Clarity, UA     Glucose, UA Negative Negative   Bilirubin, UA     Ketones, UA neg    Spec Grav, UA     Blood, UA trace    pH, UA     POC,PROTEIN,UA Trace Negative, Trace, Small (1+), Moderate (2+), Large (3+), 4+   Urobilinogen, UA     Nitrite, UA neg    Leukocytes, UA Negative Negative   Appearance     Odor      Assessment & Plan:  1) Low-risk pregnancy G2P0010 at [redacted]w[redacted]d with an Estimated Date of Delivery: 08/17/19   2) BP is borderline today, recheck in 2 days, if still elevated will proceed with delivery, no headache or visual disturbance   Meds: No orders of the defined types were placed in this encounter.  Labs/procedures today: none  Plan:  Continue routine obstetrical care  Next visit: prefers in person    Reviewed: Term labor symptoms and general obstetric precautions including but not limited to vaginal bleeding, contractions, leaking of fluid  and fetal movement were reviewed in detail with the patient.  All questions were answered. Has home bp cuff. Rx faxed to .   Follow-up: Return in about 2 days (around 07/27/2019) for BP check, with Dr Despina Hidden.  Orders Placed This Encounter  Procedures   Strep Gp B NAA   POC Urinalysis Dipstick OB   Amaryllis Dyke Shyia Fillingim  07/25/2019 4:25 PM

## 2019-07-26 ENCOUNTER — Telehealth (HOSPITAL_COMMUNITY): Payer: Self-pay | Admitting: *Deleted

## 2019-07-26 ENCOUNTER — Encounter (HOSPITAL_COMMUNITY): Payer: Self-pay | Admitting: Family Medicine

## 2019-07-26 ENCOUNTER — Other Ambulatory Visit: Payer: Self-pay

## 2019-07-26 ENCOUNTER — Inpatient Hospital Stay (EMERGENCY_DEPARTMENT_HOSPITAL)
Admission: AD | Admit: 2019-07-26 | Discharge: 2019-07-26 | Disposition: A | Discharge status: Home / Self Care | Payer: Medicare Other | Source: Home / Self Care | Attending: Family Medicine | Admitting: Family Medicine

## 2019-07-26 DIAGNOSIS — O99333 Smoking (tobacco) complicating pregnancy, third trimester: Secondary | ICD-10-CM | POA: Insufficient documentation

## 2019-07-26 DIAGNOSIS — Z881 Allergy status to other antibiotic agents status: Secondary | ICD-10-CM | POA: Insufficient documentation

## 2019-07-26 DIAGNOSIS — Z3A36 36 weeks gestation of pregnancy: Secondary | ICD-10-CM | POA: Diagnosis not present

## 2019-07-26 DIAGNOSIS — O133 Gestational [pregnancy-induced] hypertension without significant proteinuria, third trimester: Secondary | ICD-10-CM | POA: Diagnosis not present

## 2019-07-26 DIAGNOSIS — Z888 Allergy status to other drugs, medicaments and biological substances status: Secondary | ICD-10-CM | POA: Insufficient documentation

## 2019-07-26 DIAGNOSIS — F17210 Nicotine dependence, cigarettes, uncomplicated: Secondary | ICD-10-CM | POA: Insufficient documentation

## 2019-07-26 DIAGNOSIS — O134 Gestational [pregnancy-induced] hypertension without significant proteinuria, complicating childbirth: Secondary | ICD-10-CM | POA: Diagnosis not present

## 2019-07-26 DIAGNOSIS — Z20822 Contact with and (suspected) exposure to covid-19: Secondary | ICD-10-CM | POA: Insufficient documentation

## 2019-07-26 DIAGNOSIS — Z8249 Family history of ischemic heart disease and other diseases of the circulatory system: Secondary | ICD-10-CM | POA: Insufficient documentation

## 2019-07-26 DIAGNOSIS — Z9114 Patient's other noncompliance with medication regimen: Secondary | ICD-10-CM | POA: Insufficient documentation

## 2019-07-26 DIAGNOSIS — Z3689 Encounter for other specified antenatal screening: Secondary | ICD-10-CM

## 2019-07-26 LAB — CBC
HCT: 36.9 % (ref 36.0–46.0)
Hemoglobin: 11.4 g/dL — ABNORMAL LOW (ref 12.0–15.0)
MCH: 23.7 pg — ABNORMAL LOW (ref 26.0–34.0)
MCHC: 30.9 g/dL (ref 30.0–36.0)
MCV: 76.6 fL — ABNORMAL LOW (ref 80.0–100.0)
Platelets: 473 10*3/uL — ABNORMAL HIGH (ref 150–400)
RBC: 4.82 MIL/uL (ref 3.87–5.11)
RDW: 15.8 % — ABNORMAL HIGH (ref 11.5–15.5)
WBC: 13.3 10*3/uL — ABNORMAL HIGH (ref 4.0–10.5)
nRBC: 0.2 % (ref 0.0–0.2)

## 2019-07-26 LAB — COMPREHENSIVE METABOLIC PANEL
ALT: 22 U/L (ref 0–44)
AST: 27 U/L (ref 15–41)
Albumin: 2.7 g/dL — ABNORMAL LOW (ref 3.5–5.0)
Alkaline Phosphatase: 123 U/L (ref 38–126)
Anion gap: 7 (ref 5–15)
BUN: <5 mg/dL — ABNORMAL LOW (ref 6–20)
CO2: 22 mmol/L (ref 22–32)
Calcium: 8.8 mg/dL — ABNORMAL LOW (ref 8.9–10.3)
Chloride: 106 mmol/L (ref 98–111)
Creatinine, Ser: 0.61 mg/dL (ref 0.44–1.00)
GFR calc Af Amer: >60 mL/min (ref >60)
GFR calc non Af Amer: >60 mL/min (ref >60)
Glucose, Bld: 85 mg/dL (ref 70–99)
Potassium: 3.6 mmol/L (ref 3.5–5.1)
Sodium: 135 mmol/L (ref 135–145)
Total Bilirubin: 0.2 mg/dL — ABNORMAL LOW (ref 0.3–1.2)
Total Protein: 5.8 g/dL — ABNORMAL LOW (ref 6.5–8.1)

## 2019-07-26 LAB — URINALYSIS, ROUTINE W REFLEX MICROSCOPIC
Bilirubin Urine: NEGATIVE
Glucose, UA: NEGATIVE mg/dL
Hgb urine dipstick: NEGATIVE
Ketones, ur: 20 mg/dL — AB
Leukocytes,Ua: NEGATIVE
Nitrite: NEGATIVE
Protein, ur: NEGATIVE mg/dL
Specific Gravity, Urine: 1.017 (ref 1.005–1.030)
pH: 6 (ref 5.0–8.0)

## 2019-07-26 LAB — GROUP B STREP BY PCR: Group B strep by PCR: NEGATIVE

## 2019-07-26 LAB — PROTEIN / CREATININE RATIO, URINE
Creatinine, Urine: 113.37 mg/dL
Protein Creatinine Ratio: 0.11 mg/mg{Cre} (ref 0.00–0.15)
Total Protein, Urine: 13 mg/dL

## 2019-07-26 LAB — SARS CORONAVIRUS 2 BY RT PCR (HOSPITAL ORDER, PERFORMED IN ~~LOC~~ HOSPITAL LAB): SARS Coronavirus 2: NEGATIVE

## 2019-07-26 NOTE — Telephone Encounter (Signed)
Preadmission screen  

## 2019-07-26 NOTE — Discharge Instructions (Signed)

## 2019-07-26 NOTE — MAU Note (Signed)
Patient states she had an appointment today and her BP was high, took her BP tonight and it was 167/106.  Patient states she's had some lip swelling but no visual disturbances or headaches.  Denies LOF/VB.  Some mild cramping.  + FM.  Patient reports having CHTN-no meds.

## 2019-07-26 NOTE — MAU Provider Note (Addendum)
History     CSN: [id number]  Arrival date and time: 07/26/19 0865   First Provider Initiated Contact with Patient 07/26/19 0529      Chief Complaint  Patient presents with  . Hypertension   Patricia Irwin is a 29 y.o. G2P0010 at [redacted]w[redacted]d who receives care at [facility].  She presents today for Hypertension.  Patient states that she was seen in the office yesterday and blood pressures were elevated.  She was instructed to go home and take her bp prior to bedtime.  She states at 0140 her bp was 160s/100s.  Patient reports she thought the machine was broke and had her mom take her own bp, with the same cuff, and it "was her normal range."  Patient states that she came to the hospital as she was told that she would be induced if her bp remained elevated.  Patient denies HA, visual disturbances, and vaginal concerns, but endorses fetal movement.      OB History    Gravida  2   Para      Term      Preterm      AB  1   Living        SAB  1   TAB      Ectopic      Multiple      Live Births              Past Medical History:  Diagnosis Date  . Anxiety   . Chronic headache   . Chronic knee pain   . Hx of electroencephalogram 12/2013 ([facility])   normal, dx non-epileptic seizures  . Hypoglycemia   . Noncompliance with medications   . Pseudoseizures   . PTSD (post-traumatic stress disorder)   . Seizures (HCC)   . Vaginal Pap smear, abnormal     Past Surgical History:  Procedure Laterality Date  . NO PAST SURGERIES    . WISDOM TOOTH EXTRACTION      Family History  Problem Relation Age of Onset  . Diabetes Father   . Heart disease Father   . Hypertension Father   . Diabetes Paternal Grandfather   . Hypertension Paternal Grandfather   . Diabetes Paternal Grandmother   . Hypertension Paternal Grandmother   . Ovarian cancer Maternal Grandmother   . Diabetes Maternal Grandmother   . Diabetes Maternal Grandfather   . Bipolar disorder Mother   . Cancer Maternal Aunt         skin    Social History   Tobacco Use  . Smoking status: Current Every Day Smoker    Packs/day: 0.50    Types: Cigarettes  . Smokeless tobacco: Never Used  . Tobacco comment: 3 a day  Substance Use Topics  . Alcohol use: Not Currently    Comment: occasional  . Drug use: No    Allergies:  Allergies  Allergen Reactions  . Dilantin [Phenytoin] Hives  . Bee Venom   . Celexa [Citalopram Hydrobromide] Other (See Comments)    Suicidal ideation  . Cinnamon Swelling  . Influenza Vaccines Other (See Comments)    Cough  . Metronidazole Hives and Nausea Only  . Tape   . Latex Hives and Rash  . Trintellix [Vortioxetine] Rash    Medications Prior to Admission  Medication Sig Dispense Refill Last Dose  . Blood Pressure Monitor MISC For regular home bp monitoring during pregnancy 1 each 0 07/25/2019 at Unknown time  . levETIRAcetam (KEPPRA XR) 500 MG 24 hr  tablet Take 2 tablets (1,000 mg total) by mouth daily. (Patient taking differently: Take 3,000 mg by mouth daily. ) 60 tablet 0 07/25/2019 at Unknown time  . Prenatal Vit-Fe Fumarate-FA (PRENATAL VITAMIN PO) Take by mouth 2 (two) times daily.   07/25/2019 at Unknown time  . UNABLE TO FIND CBD oil- several times a day   07/25/2019 at Unknown time    Review of Systems  Constitutional: Negative for chills and fever.  Genitourinary: Negative for difficulty urinating, dysuria, vaginal bleeding and vaginal discharge.   Physical Exam   Blood pressure (!) 138/99, pulse 65, temperature 98.5 F (36.9 C), temperature source Oral, resp. rate 17, height 5\' 3"  (1.6 m), weight 81.3 kg, last menstrual period 11/10/2018, SpO2 99 %.  Vitals:   07/26/19 0457 07/26/19 0515 07/26/19 0526 07/26/19 0531  BP: (!) 133/93 (!) 134/93  (!) 138/99  Pulse: 89 83  65  Resp:      Temp:      TempSrc:      SpO2:   99% 99%  Weight:      Height:        Physical Exam  Constitutional: She is oriented to person, place, and time. She appears well-developed  and well-nourished. No distress.  HENT:  Head: Normocephalic and atraumatic.  Eyes: Conjunctivae are normal.  Cardiovascular: Normal rate, regular rhythm and normal heart sounds.  Respiratory: Effort normal and breath sounds normal. No respiratory distress.  GI: Soft. There is no abdominal tenderness.  Gravid--fundal height appears AGA, Soft, NT   Musculoskeletal:        General: Normal range of motion.     Cervical back: Normal range of motion.  Neurological: She is alert and oriented to person, place, and time.  Skin: Skin is warm and dry.  Psychiatric: She has a normal mood and affect. Her behavior is normal.    Fetal Assessment 120 bpm, Mod Var, + Variable Decels x 2, +Accels Toco: No ctx graphed  MAU Course   Results for orders placed or performed during the hospital encounter of 07/26/19 (from the past 24 hour(s))  Urinalysis, Routine w reflex microscopic     Status: Abnormal   Collection Time: 07/26/19  5:00 AM  Result Value Ref Range   Color, Urine YELLOW YELLOW   APPearance CLEAR CLEAR   Specific Gravity, Urine 1.017 1.005 - 1.030   pH 6.0 5.0 - 8.0   Glucose, UA NEGATIVE NEGATIVE mg/dL   Hgb urine dipstick NEGATIVE NEGATIVE   Bilirubin Urine NEGATIVE NEGATIVE   Ketones, ur 20 (A) NEGATIVE mg/dL   Protein, ur NEGATIVE NEGATIVE mg/dL   Nitrite NEGATIVE NEGATIVE   Leukocytes,Ua NEGATIVE NEGATIVE  Protein / creatinine ratio, urine     Status: None   Collection Time: 07/26/19  5:00 AM  Result Value Ref Range   Creatinine, Urine 113.37 mg/dL   Total Protein, Urine 13 mg/dL   Protein Creatinine Ratio 0.11 0.00 - 0.15 mg/mg[Cre]  CBC     Status: Abnormal   Collection Time: 07/26/19  5:44 AM  Result Value Ref Range   WBC 13.3 (H) 4.0 - 10.5 K/uL   RBC 4.82 3.87 - 5.11 MIL/uL   Hemoglobin 11.4 (L) 12.0 - 15.0 g/dL   HCT 36.9 36.0 - 46.0 %   MCV 76.6 (L) 80.0 - 100.0 fL   MCH 23.7 (L) 26.0 - 34.0 pg   MCHC 30.9 30.0 - 36.0 g/dL   RDW 15.8 (H) 11.5 - 15.5 %    Platelets 473 (  H) 150 - 400 K/uL   nRBC 0.2 0.0 - 0.2 %  Comprehensive metabolic panel     Status: Abnormal   Collection Time: 07/26/19  5:44 AM  Result Value Ref Range   Sodium 135 135 - 145 mmol/L   Potassium 3.6 3.5 - 5.1 mmol/L   Chloride 106 98 - 111 mmol/L   CO2 22 22 - 32 mmol/L   Glucose, Bld 85 70 - 99 mg/dL   BUN <5 (L) 6 - 20 mg/dL   Creatinine, Ser 0.27 0.44 - 1.00 mg/dL   Calcium 8.8 (L) 8.9 - 10.3 mg/dL   Total Protein 5.8 (L) 6.5 - 8.1 g/dL   Albumin 2.7 (L) 3.5 - 5.0 g/dL   AST 27 15 - 41 U/L   ALT 22 0 - 44 U/L   Alkaline Phosphatase 123 38 - 126 U/L   Total Bilirubin 0.2 (L) 0.3 - 1.2 mg/dL   GFR calc non Af Amer >60 >60 mL/min   GFR calc Af Amer >60 >60 mL/min   Anion gap 7 5 - 15   No results found.  MDM Physical Exam Labs: CBC, CMP, PC Ratio Measure BPQ15 min EFM Assessment and Plan  29 year old G2P0010  SIUP at 36.6weeks Cat I FT GHTN  -POC reviewed. -Patient informed that due to previous and current blood pressures she has been diagnosed with gestational hypertension. -Patient informed that labs would be collected to rule out PreEclampsia. -Further informed that less PreEclampsia or severe pressures she would be discharged to home with plan for IOL in 1-2 days depending upon availability.  -Patient verbalizes understanding and questions if she can eat or drink. -Instructed to abstain at current.  -Exam performed.  Cherre Robins MSN, CNM 07/26/2019, 5:32 AM   Reassessment (6:32 AM) Vitals:   07/26/19 0546 07/26/19 0601 07/26/19 0616 07/26/19 0631  BP: (!) 132/99 (!) 129/92 (!) 142/103 (!) 136/98  Pulse: 79 77 82 85  Resp:      Temp:      TempSrc:      SpO2:      Weight:      Height:        -Labs return normal. -Vitals as above-BP remain normal, but not severe range.  -Patient scheduled for IOL for 07/27/2019 at [facility]. -Orders placed for admission. -Results discussed with patient as well as POC. -Discussed induction methods including  cervical ripening agents, foley bulbs, and pitocin -Patient verbalizes understanding and questions/concerns addressed. -Patient instructed to report back to hospital tonight at 1145pm. -Precautions given-PreEclampsia and Labor.  -Nurse instructed to obtain GBS by PCR and Covid Test. -NST Reactive.  -Encouraged to call or return to MAU if symptoms worsen or with the onset of new symptoms. -Discharged to home in stable condition.  Cherre Robins MSN, CNM Advanced Practice Provider, [facility] [employer] [facility]

## 2019-07-27 ENCOUNTER — Inpatient Hospital Stay (HOSPITAL_COMMUNITY)
Admission: AD | Admit: 2019-07-27 | Discharge: 2019-07-30 | DRG: 806 | Disposition: A | Discharge status: Home / Self Care | Payer: Medicare Other | Attending: Obstetrics and Gynecology | Admitting: Obstetrics and Gynecology

## 2019-07-27 ENCOUNTER — Encounter: Payer: Medicare Other | Admitting: Obstetrics & Gynecology

## 2019-07-27 ENCOUNTER — Inpatient Hospital Stay (HOSPITAL_COMMUNITY): Payer: Medicare Other

## 2019-07-27 DIAGNOSIS — F17210 Nicotine dependence, cigarettes, uncomplicated: Secondary | ICD-10-CM | POA: Diagnosis present

## 2019-07-27 DIAGNOSIS — Z3403 Encounter for supervision of normal first pregnancy, third trimester: Secondary | ICD-10-CM

## 2019-07-27 DIAGNOSIS — O134 Gestational [pregnancy-induced] hypertension without significant proteinuria, complicating childbirth: Principal | ICD-10-CM | POA: Diagnosis present

## 2019-07-27 DIAGNOSIS — O99824 Streptococcus B carrier state complicating childbirth: Secondary | ICD-10-CM | POA: Diagnosis present

## 2019-07-27 DIAGNOSIS — O99334 Smoking (tobacco) complicating childbirth: Secondary | ICD-10-CM | POA: Diagnosis present

## 2019-07-27 DIAGNOSIS — Z8659 Personal history of other mental and behavioral disorders: Secondary | ICD-10-CM

## 2019-07-27 DIAGNOSIS — N83291 Other ovarian cyst, right side: Secondary | ICD-10-CM | POA: Diagnosis present

## 2019-07-27 DIAGNOSIS — Z3A37 37 weeks gestation of pregnancy: Secondary | ICD-10-CM | POA: Diagnosis not present

## 2019-07-27 DIAGNOSIS — Z30017 Encounter for initial prescription of implantable subdermal contraceptive: Secondary | ICD-10-CM | POA: Diagnosis not present

## 2019-07-27 DIAGNOSIS — O99354 Diseases of the nervous system complicating childbirth: Secondary | ICD-10-CM | POA: Diagnosis present

## 2019-07-27 DIAGNOSIS — F419 Anxiety disorder, unspecified: Secondary | ICD-10-CM | POA: Diagnosis present

## 2019-07-27 DIAGNOSIS — O3483 Maternal care for other abnormalities of pelvic organs, third trimester: Secondary | ICD-10-CM | POA: Diagnosis present

## 2019-07-27 DIAGNOSIS — G40909 Epilepsy, unspecified, not intractable, without status epilepticus: Secondary | ICD-10-CM | POA: Diagnosis present

## 2019-07-27 DIAGNOSIS — Z3490 Encounter for supervision of normal pregnancy, unspecified, unspecified trimester: Secondary | ICD-10-CM

## 2019-07-27 DIAGNOSIS — Z20822 Contact with and (suspected) exposure to covid-19: Secondary | ICD-10-CM | POA: Diagnosis present

## 2019-07-27 DIAGNOSIS — Z87898 Personal history of other specified conditions: Secondary | ICD-10-CM

## 2019-07-27 DIAGNOSIS — F17200 Nicotine dependence, unspecified, uncomplicated: Secondary | ICD-10-CM | POA: Diagnosis present

## 2019-07-27 LAB — CBC
HCT: 36.6 % (ref 36.0–46.0)
Hemoglobin: 11.7 g/dL — ABNORMAL LOW (ref 12.0–15.0)
MCH: 24.1 pg — ABNORMAL LOW (ref 26.0–34.0)
MCHC: 32 g/dL (ref 30.0–36.0)
MCV: 75.3 fL — ABNORMAL LOW (ref 80.0–100.0)
Platelets: 511 10*3/uL — ABNORMAL HIGH (ref 150–400)
RBC: 4.86 MIL/uL (ref 3.87–5.11)
RDW: 15.7 % — ABNORMAL HIGH (ref 11.5–15.5)
WBC: 14.6 10*3/uL — ABNORMAL HIGH (ref 4.0–10.5)
nRBC: 0.1 % (ref 0.0–0.2)

## 2019-07-27 LAB — STREP GP B NAA: Strep Gp B NAA: POSITIVE — AB

## 2019-07-27 LAB — COMPREHENSIVE METABOLIC PANEL
ALT: 23 U/L (ref 0–44)
AST: 29 U/L (ref 15–41)
Albumin: 2.7 g/dL — ABNORMAL LOW (ref 3.5–5.0)
Alkaline Phosphatase: 122 U/L (ref 38–126)
Anion gap: 10 (ref 5–15)
BUN: 5 mg/dL — ABNORMAL LOW (ref 6–20)
CO2: 18 mmol/L — ABNORMAL LOW (ref 22–32)
Calcium: 8.8 mg/dL — ABNORMAL LOW (ref 8.9–10.3)
Chloride: 107 mmol/L (ref 98–111)
Creatinine, Ser: 0.46 mg/dL (ref 0.44–1.00)
GFR calc Af Amer: >60 mL/min (ref >60)
GFR calc non Af Amer: >60 mL/min (ref >60)
Glucose, Bld: 111 mg/dL — ABNORMAL HIGH (ref 70–99)
Potassium: 3.6 mmol/L (ref 3.5–5.1)
Sodium: 135 mmol/L (ref 135–145)
Total Bilirubin: 0.3 mg/dL (ref 0.3–1.2)
Total Protein: 5.9 g/dL — ABNORMAL LOW (ref 6.5–8.1)

## 2019-07-27 LAB — ABO/RH: ABO/RH(D): O POS

## 2019-07-27 LAB — CERVICOVAGINAL ANCILLARY ONLY
Chlamydia: NEGATIVE
Comment: NEGATIVE
Comment: NORMAL
Neisseria Gonorrhea: NEGATIVE

## 2019-07-27 LAB — PROTEIN / CREATININE RATIO, URINE
Creatinine, Urine: 170.49 mg/dL
Protein Creatinine Ratio: 0.11 mg/mg{Cre} (ref 0.00–0.15)
Total Protein, Urine: 18 mg/dL

## 2019-07-27 LAB — RPR: RPR Ser Ql: NONREACTIVE

## 2019-07-27 LAB — TYPE AND SCREEN
ABO/RH(D): O POS
Antibody Screen: NEGATIVE

## 2019-07-27 LAB — OB RESULTS CONSOLE GBS: GBS: POSITIVE

## 2019-07-27 MED ORDER — OXYTOCIN-SODIUM CHLORIDE 30-0.9 UT/500ML-% IV SOLN
2.5000 [IU]/h | INTRAVENOUS | Status: DC
  Administered 2019-07-28: 2.5 [IU]/h via INTRAVENOUS
  Filled 2019-07-27: qty 500

## 2019-07-27 MED ORDER — LACTATED RINGERS IV SOLN: INTRAVENOUS | Status: DC

## 2019-07-27 MED ORDER — TERBUTALINE SULFATE 1 MG/ML IJ SOLN: 0.2500 mg | Freq: Once | INTRAMUSCULAR | Status: DC | PRN

## 2019-07-27 MED ORDER — ONDANSETRON HCL 4 MG/2ML IJ SOLN
4.0000 mg | Freq: Four times a day (QID) | INTRAMUSCULAR | Status: DC | PRN
  Administered 2019-07-27: 4 mg via INTRAVENOUS
  Filled 2019-07-27: qty 2

## 2019-07-27 MED ORDER — ACETAMINOPHEN 325 MG PO TABS
650.0000 mg | ORAL_TABLET | ORAL | Status: DC | PRN
  Administered 2019-07-27: 650 mg via ORAL
  Filled 2019-07-27: qty 2

## 2019-07-27 MED ORDER — LIDOCAINE HCL (PF) 1 % IJ SOLN
30.0000 mL | INTRAMUSCULAR | Status: DC | PRN
Start: 2019-07-27 — End: 2019-07-27

## 2019-07-27 MED ORDER — LIDOCAINE HCL (PF) 1 % IJ SOLN: 30.0000 mL | INTRAMUSCULAR | Status: DC | PRN

## 2019-07-27 MED ORDER — OXYTOCIN BOLUS FROM INFUSION
500.0000 mL | Freq: Once | INTRAVENOUS | Status: AC
  Administered 2019-07-28: 500 mL via INTRAVENOUS

## 2019-07-27 MED ORDER — ONDANSETRON HCL 4 MG/2ML IJ SOLN: 4.0000 mg | Freq: Four times a day (QID) | INTRAMUSCULAR | Status: DC | PRN

## 2019-07-27 MED ORDER — OXYCODONE-ACETAMINOPHEN 5-325 MG PO TABS: 2.0000 | ORAL_TABLET | ORAL | Status: DC | PRN

## 2019-07-27 MED ORDER — DIPHENHYDRAMINE HCL 50 MG/ML IJ SOLN: 12.5000 mg | INTRAMUSCULAR | Status: DC | PRN

## 2019-07-27 MED ORDER — MISOPROSTOL 50MCG HALF TABLET
50.0000 ug | ORAL_TABLET | ORAL | Status: DC | PRN
  Administered 2019-07-27 – 2019-07-28 (×6): 50 ug via ORAL
  Filled 2019-07-27 (×6): qty 1

## 2019-07-27 MED ORDER — EPHEDRINE 5 MG/ML INJ: 10.0000 mg | INTRAVENOUS | Status: DC | PRN

## 2019-07-27 MED ORDER — OXYTOCIN-SODIUM CHLORIDE 30-0.9 UT/500ML-% IV SOLN: 2.5000 [IU]/h | INTRAVENOUS | Status: DC

## 2019-07-27 MED ORDER — LACTATED RINGERS IV SOLN: 500.0000 mL | INTRAVENOUS | Status: DC | PRN

## 2019-07-27 MED ORDER — LEVETIRACETAM ER 500 MG PO TB24
3000.0000 mg | ORAL_TABLET | Freq: Every day | ORAL | Status: DC
  Administered 2019-07-27 – 2019-07-28 (×2): 3000 mg via ORAL
  Filled 2019-07-27 (×2): qty 6

## 2019-07-27 MED ORDER — SODIUM CHLORIDE 0.9 % IV SOLN
5.0000 10*6.[IU] | Freq: Once | INTRAVENOUS | Status: AC
  Administered 2019-07-27: 5 10*6.[IU] via INTRAVENOUS
  Filled 2019-07-27: qty 5

## 2019-07-27 MED ORDER — NICOTINE 21 MG/24HR TD PT24
21.0000 mg | MEDICATED_PATCH | Freq: Every day | TRANSDERMAL | Status: DC
  Administered 2019-07-27 – 2019-07-28 (×2): 21 mg via TRANSDERMAL
  Filled 2019-07-27 (×3): qty 1

## 2019-07-27 MED ORDER — OXYCODONE-ACETAMINOPHEN 5-325 MG PO TABS: 1.0000 | ORAL_TABLET | ORAL | Status: DC | PRN

## 2019-07-27 MED ORDER — ACETAMINOPHEN 325 MG PO TABS: 650.0000 mg | ORAL_TABLET | ORAL | Status: DC | PRN

## 2019-07-27 MED ORDER — LEVETIRACETAM ER 500 MG PO TB24
3000.0000 mg | ORAL_TABLET | Freq: Every day | ORAL | Status: DC
  Filled 2019-07-27: qty 6

## 2019-07-27 MED ORDER — LACTATED RINGERS IV SOLN
500.0000 mL | Freq: Once | INTRAVENOUS | Status: AC
  Administered 2019-07-28: 500 mL via INTRAVENOUS

## 2019-07-27 MED ORDER — FENTANYL-BUPIVACAINE-NACL 0.5-0.125-0.9 MG/250ML-% EP SOLN
12.0000 mL/h | EPIDURAL | Status: DC | PRN
  Filled 2019-07-27: qty 250

## 2019-07-27 MED ORDER — SOD CITRATE-CITRIC ACID 500-334 MG/5ML PO SOLN: 30.0000 mL | ORAL | Status: DC | PRN

## 2019-07-27 MED ORDER — PHENYLEPHRINE 40 MCG/ML (10ML) SYRINGE FOR IV PUSH (FOR BLOOD PRESSURE SUPPORT)
80.0000 ug | PREFILLED_SYRINGE | INTRAVENOUS | Status: DC | PRN
  Filled 2019-07-27: qty 10

## 2019-07-27 MED ORDER — PENICILLIN G POT IN DEXTROSE 60000 UNIT/ML IV SOLN
3.0000 10*6.[IU] | INTRAVENOUS | Status: DC
  Administered 2019-07-28 (×2): 3 10*6.[IU] via INTRAVENOUS
  Filled 2019-07-27 (×2): qty 50

## 2019-07-27 MED ORDER — PHENYLEPHRINE 40 MCG/ML (10ML) SYRINGE FOR IV PUSH (FOR BLOOD PRESSURE SUPPORT): 80.0000 ug | PREFILLED_SYRINGE | INTRAVENOUS | Status: DC | PRN

## 2019-07-27 MED ORDER — FLEET ENEMA 7-19 GM/118ML RE ENEM: 1.0000 | ENEMA | Freq: Every day | RECTAL | Status: DC | PRN

## 2019-07-27 MED ORDER — LEVETIRACETAM ER 500 MG PO TB24: 3000.0000 mg | ORAL_TABLET | Freq: Every day | ORAL | Status: DC

## 2019-07-27 MED ORDER — OXYTOCIN BOLUS FROM INFUSION: 500.0000 mL | Freq: Once | INTRAVENOUS | Status: DC

## 2019-07-27 MED ORDER — FENTANYL CITRATE (PF) 100 MCG/2ML IJ SOLN
50.0000 ug | INTRAMUSCULAR | Status: DC | PRN
  Administered 2019-07-27 – 2019-07-28 (×6): 100 ug via INTRAVENOUS
  Filled 2019-07-27 (×6): qty 2

## 2019-07-27 MED ORDER — SOD CITRATE-CITRIC ACID 500-334 MG/5ML PO SOLN
30.0000 mL | ORAL | Status: DC | PRN
  Administered 2019-07-28: 30 mL via ORAL
  Filled 2019-07-27: qty 30

## 2019-07-27 NOTE — Progress Notes (Signed)
LABOR PROGRESS NOTE  Patricia Irwin is a 29 y.o. G2P0010 at [redacted]w[redacted]d  admitted for IOL for gHTN.  Subjective: Sleeping on entry to room Occasional contractions, overall comfortable  Objective: BP 123/87   Pulse 74   Temp 99.2 F (37.3 C) (Oral)   Resp 18   LMP 11/10/2018  or  Vitals:   07/27/19 0401 07/27/19 0431 07/27/19 0600 07/27/19 0602  BP: 121/78 122/80 129/77 123/87  Pulse: 77 69 76 74  Resp: 16 18  18   Temp:      TempSrc:         Dilation: Closed Effacement (%): Thick Cervical Position: Middle Station: -2 Presentation: Vertex Exam by:: [email or web address] MD FHT: baseline rate 125, moderate varibility, +acel, -decel Toco:  None picked up  Labs: Lab Results  Component Value Date   WBC 14.6 (H) 07/27/2019   HGB 11.7 (L) 07/27/2019   HCT 36.6 07/27/2019   MCV 75.3 (L) 07/27/2019   PLT 511 (H) 07/27/2019    Patient Active Problem List   Diagnosis Date Noted  . Pregnant 07/27/2019  . Anxiety 02/27/2019  . Supervision of normal pregnancy 02/06/2019  . Smoker 02/06/2019  . History of seizures 11/10/2016  . History of posttraumatic stress disorder (PTSD) 11/10/2016  . History of ovarian cyst 06/02/2016    Assessment / Plan: 29 y.o. G2P0010 at [redacted]w[redacted]d here for IOL for gHTN.  Labor: internal os still closed, given miso x2 @ [phone] Fetal Wellbeing:  Cat I Pain Control:  IV pain meds PRN, epidural upon request GBS: PCR negative Anticipated MOD:  SVD  Seizure disorder/pseudoseizures: cont Keppra 3g XL daily  Tobacco use: pack a day smoker, ordered for 21mg  nicotine patch  [id number], MD/MPH OB Fellow  07/27/2019, 6:34 AM

## 2019-07-27 NOTE — Progress Notes (Addendum)
Patient ID: ELISA KUTNER, female   DOB: 10-Apr-1990, 29 y.o.   MRN: [id number]  MECHELL GIRGIS is a 29 y.o. G2P0010 at [redacted]w[redacted]d admitted for induction of labor due to Gestational hypertension.  Subjective: Feeling well.  No complaints.  Objective: BP (!) 133/99   Pulse 89   Temp 98.8 F (37.1 C) (Oral)   Resp 13   LMP 11/10/2018  No intake/output data recorded.  FHT:  FHR: 135 bpm, variability: moderate,  accelerations:  Present,  decelerations:  Present  UC:   irregular, every 3-5 minutes  SVE:   Dilation: 4 Effacement (%): 50 Station: -3 Exam by:: Mickle Mallory, RN   Labs: Lab Results  Component Value Date   WBC 14.6 (H) 07/27/2019   HGB 11.7 (L) 07/27/2019   HCT 36.6 07/27/2019   MCV 75.3 (L) 07/27/2019   PLT 511 (H) 07/27/2019    Assessment / Plan:  Induction of labor due to gestational hypertension,  progressing well on pitocin  Start IV penicillin for GBS+  Labor: Progressing normally, foley bulb out, cytotec given at 2015 Fetal Wellbeing:  Category I Pain Control:  IV pain meds I/D:  GBS positive per 6/1 culture at [facility] Anticipated MOD:   Vaginal delivery  Bayard Beaver, MS3 07/27/2019, 9:02 PM  .

## 2019-07-27 NOTE — H&P (Signed)
OBSTETRIC ADMISSION HISTORY AND PHYSICAL  Patricia Irwin is a 29 y.o. female G2P0010 with IUP at [redacted]w[redacted]d by LMP  presenting for IOL for gHTN. She reports +FMs, No LOF, no VB, no blurry vision, headaches or peripheral edema, and RUQ pain.  She plans on bottle feeding. She requests Nexplanon for birth control. She received her prenatal care at [facility]   Dating: By LMP --->  Estimated Date of Delivery: 08/17/19  Sono:   @[redacted]w[redacted]d , CWD, normal anatomy, breech presentation, anterior placenta, 290g, 68% EFW   Prenatal History/Complications: -Hx of Seizures -Tobacco Use -Elevated BP, negative Preeclampsia labs  -simple R ovarian cyst noted on anatomy scan , 6.4 x 3 x 3.9 cm  Past Medical History: Past Medical History:  Diagnosis Date  . Anxiety   . Chronic headache   . Chronic knee pain   . Hx of electroencephalogram 12/2013 ([facility])   normal, dx non-epileptic seizures  . Hypoglycemia   . Noncompliance with medications   . Pseudoseizures   . PTSD (post-traumatic stress disorder)   . Seizures (HCC)   . Vaginal Pap smear, abnormal     Past Surgical History: Past Surgical History:  Procedure Laterality Date  . NO PAST SURGERIES    . WISDOM TOOTH EXTRACTION      Obstetrical History: OB History    Gravida  2   Para      Term      Preterm      AB  1   Living        SAB  1   TAB      Ectopic      Multiple      Live Births              Social History: Social History   Socioeconomic History  . Marital status: Legally Separated    Spouse name: Not on file  . Number of children: Not on file  . Years of education: Not on file  . Highest education level: High school graduate  Occupational History  . Not on file  Tobacco Use  . Smoking status: Current Every Day Smoker    Packs/day: 0.50    Types: Cigarettes  . Smokeless tobacco: Never Used  . Tobacco comment: 3 a day  Substance and Sexual Activity  . Alcohol use: Not Currently    Comment: occasional  .  Drug use: No  . Sexual activity: Yes    Birth control/protection: None  Other Topics Concern  . Not on file  Social History Narrative  . Not on file   Social Determinants of Health   Financial Resource Strain: Low Risk   . Difficulty of Paying Living Expenses: Not very hard  Food Insecurity: No Food Insecurity  . Worried About 01/2014 in the Last Year: Never true  . Ran Out of Food in the Last Year: Never true  Transportation Needs: No Transportation Needs  . Lack of Transportation (Medical): No  . Lack of Transportation (Non-Medical): No  Physical Activity: Sufficiently Active  . Days of Exercise per Week: 5 days  . Minutes of Exercise per Session: 50 min  Stress: No Stress Concern Present  . Feeling of Stress : Only a little  Social Connections: Somewhat Isolated  . Frequency of Communication with Friends and Family: More than three times a week  . Frequency of Social Gatherings with Friends and Family: Once a week  . Attends Religious Services: Never  . Active Member of  Clubs or Organizations: No  . Attends Archivist Meetings: Never  . Marital Status: Living with partner    Family History: Family History  Problem Relation Age of Onset  . Diabetes Father   . Heart disease Father   . Hypertension Father   . Diabetes Paternal Grandfather   . Hypertension Paternal Grandfather   . Diabetes Paternal Grandmother   . Hypertension Paternal Grandmother   . Ovarian cancer Maternal Grandmother   . Diabetes Maternal Grandmother   . Diabetes Maternal Grandfather   . Bipolar disorder Mother   . Cancer Maternal Aunt        skin    Allergies: Allergies  Allergen Reactions  . Dilantin [Phenytoin] Hives  . Bee Venom   . Celexa [Citalopram Hydrobromide] Other (See Comments)    Suicidal ideation  . Cinnamon Swelling  . Influenza Vaccines Other (See Comments)    Cough  . Metronidazole Hives and Nausea Only  . Tape   . Latex Hives and Rash  .  Trintellix [Vortioxetine] Rash    Medications Prior to Admission  Medication Sig Dispense Refill Last Dose  . Blood Pressure Monitor MISC For regular home bp monitoring during pregnancy 1 each 0 07/26/2019 at 0200  . levETIRAcetam (KEPPRA XR) 500 MG 24 hr tablet Take 2 tablets (1,000 mg total) by mouth daily. (Patient taking differently: Take 3,000 mg by mouth daily. ) 60 tablet 0 07/25/2019 at 1500  . Prenatal Vit-Fe Fumarate-FA (PRENATAL VITAMIN PO) Take by mouth 2 (two) times daily.   07/25/2019 at 1500  . UNABLE TO FIND CBD oil- several times a day   07/25/2019 at 1500     Review of Systems   All systems reviewed and negative except as stated in HPI  Last menstrual period 11/10/2018. General appearance: alert, cooperative, appears stated age and no distress Lungs: normal effort Heart: regular rate  Abdomen: soft, non-tender; bowel sounds normal Pelvic: gravid uterus Extremities: Homans sign is negative, no sign of DVT Presentation: cephalic  Fetal monitoringBaseline: 120 bpm, Variability: Good {> 6 bpm), Accelerations: Reactive and Decelerations: Absent Uterine activity: every 2-3 minutes     Prenatal labs: ABO, Rh: --/--/PENDING (06/03 0128) Antibody: PENDING (06/03 0128) Rubella: 2.88 (12/14 1631) RPR: Non Reactive (04/08 0905)  HBsAg: Negative (12/14 1631)  HIV: Non Reactive (04/08 0905)  GBS: NEGATIVE/-- (06/02 0651)  2 hr Glucola normal  Genetic screening  Materni21 normal  Anatomy US normal female   Prenatal Transfer Tool  Maternal Diabetes: No Genetic Screening: Normal Maternal Ultrasounds/Referrals: Normal Fetal Ultrasounds or other Referrals:  None Maternal Substance Abuse:  Yes:  Type: Smoker Significant Maternal Medications:  Meds include: Other:   Keppra 3000mg  daily  Significant Maternal Lab Results: Group B Strep negative  Results for orders placed or performed during the hospital encounter of 07/27/19 (from the past 24 hour(s))  CBC   Collection Time:  07/27/19  1:00 AM  Result Value Ref Range   WBC 14.6 (H) 4.0 - 10.5 K/uL   RBC 4.86 3.87 - 5.11 MIL/uL   Hemoglobin 11.7 (L) 12.0 - 15.0 g/dL   HCT 36.6 36.0 - 46.0 %   MCV 75.3 (L) 80.0 - 100.0 fL   MCH 24.1 (L) 26.0 - 34.0 pg   MCHC 32.0 30.0 - 36.0 g/dL   RDW 15.7 (H) 11.5 - 15.5 %   Platelets 511 (H) 150 - 400 K/uL   nRBC 0.1 0.0 - 0.2 %  Type and screen [place]  Collection Time: 07/27/19  1:28 AM  Result Value Ref Range   ABO/RH(D) PENDING    Antibody Screen PENDING    Sample Expiration      07/30/2019,2359 Performed at [facility], [address] [place], [place] [address]   Results for orders placed or performed during the hospital encounter of 07/26/19 (from the past 24 hour(s))  Urinalysis, Routine w reflex microscopic   Collection Time: 07/26/19  5:00 AM  Result Value Ref Range   Color, Urine YELLOW YELLOW   APPearance CLEAR CLEAR   Specific Gravity, Urine 1.017 1.005 - 1.030   pH 6.0 5.0 - 8.0   Glucose, UA NEGATIVE NEGATIVE mg/dL   Hgb urine dipstick NEGATIVE NEGATIVE   Bilirubin Urine NEGATIVE NEGATIVE   Ketones, ur 20 (A) NEGATIVE mg/dL   Protein, ur NEGATIVE NEGATIVE mg/dL   Nitrite NEGATIVE NEGATIVE   Leukocytes,Ua NEGATIVE NEGATIVE  Protein / creatinine ratio, urine   Collection Time: 07/26/19  5:00 AM  Result Value Ref Range   Creatinine, Urine 113.37 mg/dL   Total Protein, Urine 13 mg/dL   Protein Creatinine Ratio 0.11 0.00 - 0.15 mg/mg[Cre]  CBC   Collection Time: 07/26/19  5:44 AM  Result Value Ref Range   WBC 13.3 (H) 4.0 - 10.5 K/uL   RBC 4.82 3.87 - 5.11 MIL/uL   Hemoglobin 11.4 (L) 12.0 - 15.0 g/dL   HCT 82.4 23.5 - 36.1 %   MCV 76.6 (L) 80.0 - 100.0 fL   MCH 23.7 (L) 26.0 - 34.0 pg   MCHC 30.9 30.0 - 36.0 g/dL   RDW 44.3 (H) 15.4 - 00.8 %   Platelets 473 (H) 150 - 400 K/uL   nRBC 0.2 0.0 - 0.2 %  Comprehensive metabolic panel   Collection Time: 07/26/19  5:44 AM  Result Value Ref Range   Sodium 135 135 -  145 mmol/L   Potassium 3.6 3.5 - 5.1 mmol/L   Chloride 106 98 - 111 mmol/L   CO2 22 22 - 32 mmol/L   Glucose, Bld 85 70 - 99 mg/dL   BUN <5 (L) 6 - 20 mg/dL   Creatinine, Ser 6.76 0.44 - 1.00 mg/dL   Calcium 8.8 (L) 8.9 - 10.3 mg/dL   Total Protein 5.8 (L) 6.5 - 8.1 g/dL   Albumin 2.7 (L) 3.5 - 5.0 g/dL   AST 27 15 - 41 U/L   ALT 22 0 - 44 U/L   Alkaline Phosphatase 123 38 - 126 U/L   Total Bilirubin 0.2 (L) 0.3 - 1.2 mg/dL   GFR calc non Af Amer >60 >60 mL/min   GFR calc Af Amer >60 >60 mL/min   Anion gap 7 5 - 15  SARS Coronavirus 2 by RT PCR (hospital order, performed in [facility] hospital lab)   Collection Time: 07/26/19  6:50 AM  Result Value Ref Range   SARS Coronavirus 2 NEGATIVE NEGATIVE  Group B strep by PCR   Collection Time: 07/26/19  6:51 AM   Specimen: Vaginal/Rectal; Genital  Result Value Ref Range   Group B strep by PCR NEGATIVE NEGATIVE    Patient Active Problem List   Diagnosis Date Noted  . Pregnant 07/27/2019  . Anxiety 02/27/2019  . Supervision of normal pregnancy 02/06/2019  . Smoker 02/06/2019  . History of seizures 11/10/2016  . History of posttraumatic stress disorder (PTSD) 11/10/2016  . History of ovarian cyst 06/02/2016    Assessment/Plan:  KYIRA VOLKERT is a 29 y.o. G2P0010 at  [redacted]w[redacted]d here for IOL 2/2 gHTN.   #Labor: cytotec 50mg  PO, will follow with foley balloon if progressed at 4 hour mark, anticipate vaginal delivery  #Pain: Labor support for now, patient would ultimately like to have epidural  #FWB: Category I #ID:  GBS PCR negative  #MOF: bottle  #MOC: PP Nexplanon  #Circ:  Yes, inpatient #Tobacco use during pregnancy: nicotine patch 21mg   # Hx of Seizures: last seizure 3-4 months ago, following w [facility] neurology, continuing Keppra 3000mg  daily   , MD Family Medicine, PGY-1 07/27/2019, 1:48 AM    OB FELLOW ATTESTATION  I have seen and examined this patient and edited the above documentation in the resident's  note to reflect any changes or updates.  , MD/[facility] OB Fellow  07/27/2019, 6:38 AM

## 2019-07-27 NOTE — Progress Notes (Signed)
Patient ID: Patricia Irwin, female   DOB: January 10, 1991, 29 y.o.   MRN: [id number]  TAMURA LASKY is a 29 y.o. G2P0010 at [redacted]w[redacted]d admitted for induction of labor due to [facility].  Subjective: Reports pain 9/10 currently, requesting IV pain meds.  Otherwise no complaints.  Objective: BP (!) 121/92   Pulse 87   Temp 98.1 F (36.7 C) (Oral)   Resp 18   LMP 11/10/2018  No intake/output data recorded.  FHT:  FHR: 120 bpm, variability: moderate,  accelerations:  Present,  decelerations:  Absent UC: Irregular  SVE:   Dilation: 3 Effacement (%): 50 Station: -2 Exam by:: Cher Nakai RNC  Labs: Lab Results  Component Value Date   WBC 14.6 (H) 07/27/2019   HGB 11.7 (L) 07/27/2019   HCT 36.6 07/27/2019   MCV 75.3 (L) 07/27/2019   PLT 511 (H) 07/27/2019    Assessment / Plan: Induction of labor due to gHTN.  Labor: Progressing normally and foley bulb still in place.   Fetal Wellbeing:  Category I Pain Control:  IV pain meds, epidural upon request I/D:  GBS negative Anticipated MOD:  Vaginal delivery  Cliffie Gingras Genene Churn, MD PGY-2 Resident Family Medicine 07/27/2019, 3:35 PM

## 2019-07-27 NOTE — Progress Notes (Signed)
LABOR PROGRESS NOTE  Patricia Irwin is a 29 y.o. G2P0010 at [redacted]w[redacted]d  admitted for IOL for gHTN.  Subjective: Sleeping on entry to room Patient comfortable upon awakening, occasionally feeling light contractions. Discussed further induction with foley bulb placement. Patient agrees to have placed.  Objective: BP (!) 123/91   Pulse 71   Temp 98.2 F (36.8 C) (Oral)   Resp 16   LMP 11/10/2018  or  Vitals:   07/27/19 0702 07/27/19 0801 07/27/19 0901 07/27/19 1120  BP: 110/62 115/86 (!) 123/91   Pulse: 64 69 71   Resp: 18  16   Temp: 99 F (37.2 C)   98.2 F (36.8 C)  TempSrc: Oral   Oral     Dilation: 1 Effacement (%): 50 Cervical Position: Middle Station: -2 Presentation: Vertex Exam by:: Earlene Plater, RN  FHT: baseline rate 130, moderate varibility, +acel, -decel Toco:  Quiet  Labs: Lab Results  Component Value Date   WBC 14.6 (H) 07/27/2019   HGB 11.7 (L) 07/27/2019   HCT 36.6 07/27/2019   MCV 75.3 (L) 07/27/2019   PLT 511 (H) 07/27/2019    Patient Active Problem List   Diagnosis Date Noted  . Pregnant 07/27/2019  . Anxiety 02/27/2019  . Supervision of normal pregnancy 02/06/2019  . Smoker 02/06/2019  . History of seizures 11/10/2016  . History of posttraumatic stress disorder (PTSD) 11/10/2016  . History of ovarian cyst 06/02/2016    Assessment / Plan: 29 y.o. G2P0010 at [redacted]w[redacted]d here for IOL for gHTN.  Labor: FB placed @ 10:45, cyto#3 given Fetal Wellbeing:  Cat I Pain Control:  IV pain meds PRN, epidural upon request GBS: PCR negative Anticipated MOD:  SVD  Seizure disorder/pseudoseizures: cont Keppra 3g XL daily  Tobacco use: pack a day smoker, ordered for 21mg  nicotine patch   , DO [facility] 07/27/2019  11:29 AM

## 2019-07-27 NOTE — Progress Notes (Signed)
Patient ID: Patricia Irwin, female   DOB: 1990-05-14, 29 y.o.   MRN: [id number]  Patricia Irwin is a 29 y.o. G2P0010 at [redacted]w[redacted]d admitted for induction of labor due to Gestational hypertension.  Subjective: Feeling well.  No complaints.  Objective: BP (!) 133/99   Pulse 89   Temp 98.8 F (37.1 C) (Oral)   Resp 13   LMP 11/10/2018  No intake/output data recorded.  FHT:  FHR: 135 bpm, variability: moderate,  accelerations:  Present,  decelerations:  Present  UC:   irregular, every 4-6 minutes  SVE:   Dilation: 4 Effacement (%): 50 Station: -3 Exam by:: Patricia Mallory, RN   Labs: Lab Results  Component Value Date   WBC 14.6 (H) 07/27/2019   HGB 11.7 (L) 07/27/2019   HCT 36.6 07/27/2019   MCV 75.3 (L) 07/27/2019   PLT 511 (H) 07/27/2019    Assessment / Plan:  Induction of labor due to gestational hypertension,  progressing well on pitocin  Labor: Progressing normally, foley bulb out, will give another dose of cytotec Fetal Wellbeing:  Category I Pain Control:  IV pain meds I/D:  GBS negative Anticipated MOD:  Vaginal delivery  Patricia Irwin Patricia Churn, MD PGY-2 Resident Family Medicine 07/27/2019, 8:20 PM

## 2019-07-27 NOTE — Progress Notes (Signed)
Patient ID: Patricia Irwin, female   DOB: [birth year], 29 y.o.   MRN: [id number]   Patricia Irwin is a 29 y.o. G2P0010 at [redacted]w[redacted]d admitted for induction of labor due to [facility].  Subjective: Sleeping when I entered the room.  Pain better controlled now.  No complaints.  Objective: BP 139/78   Pulse 64   Temp 98.1 F (36.7 C) (Oral)   Resp 18   LMP 11/10/2018  No intake/output data recorded.  FHT:  FHR: 125 bpm, variability: moderate,  accelerations:  Present,  decelerations:  Absent UC:   Irregular  SVE:  Foley bulb in place, cervix thin around bulb  Labs: Lab Results  Component Value Date   WBC 14.6 (H) 07/27/2019   HGB 11.7 (L) 07/27/2019   HCT 36.6 07/27/2019   MCV 75.3 (L) 07/27/2019   PLT 511 (H) 07/27/2019    Assessment / Plan: Induction of labor due to gHTN.  Labor: Progressing normally and foley bulb still in place.   Fetal Wellbeing:  Category I Pain Control:  IV pain meds, epidural upon request I/D:  GBS negative Anticipated MOD:  Vaginal delivery  Sarvesh Meddaugh Genene Churn, MD PGY-2 Resident Family Medicine 07/27/2019, 5:27 PM

## 2019-07-28 ENCOUNTER — Inpatient Hospital Stay (HOSPITAL_COMMUNITY): Payer: Medicare Other | Admitting: Anesthesiology

## 2019-07-28 ENCOUNTER — Encounter (HOSPITAL_COMMUNITY): Payer: Self-pay | Admitting: Obstetrics & Gynecology

## 2019-07-28 DIAGNOSIS — O134 Gestational [pregnancy-induced] hypertension without significant proteinuria, complicating childbirth: Secondary | ICD-10-CM

## 2019-07-28 DIAGNOSIS — Z3A37 37 weeks gestation of pregnancy: Secondary | ICD-10-CM

## 2019-07-28 LAB — CBC
HCT: 35.2 % — ABNORMAL LOW (ref 36.0–46.0)
HCT: 37 % (ref 36.0–46.0)
Hemoglobin: 11.3 g/dL — ABNORMAL LOW (ref 12.0–15.0)
Hemoglobin: 11.8 g/dL — ABNORMAL LOW (ref 12.0–15.0)
MCH: 24.1 pg — ABNORMAL LOW (ref 26.0–34.0)
MCH: 24.1 pg — ABNORMAL LOW (ref 26.0–34.0)
MCHC: 32.1 g/dL (ref 30.0–36.0)
MCHC: 31.9 g/dL (ref 30.0–36.0)
MCV: 75.2 fL — ABNORMAL LOW (ref 80.0–100.0)
MCV: 75.7 fL — ABNORMAL LOW (ref 80.0–100.0)
Platelets: 451 10*3/uL — ABNORMAL HIGH (ref 150–400)
Platelets: 485 10*3/uL — ABNORMAL HIGH (ref 150–400)
RBC: 4.68 MIL/uL (ref 3.87–5.11)
RBC: 4.89 MIL/uL (ref 3.87–5.11)
RDW: 15.8 % — ABNORMAL HIGH (ref 11.5–15.5)
RDW: 15.9 % — ABNORMAL HIGH (ref 11.5–15.5)
WBC: 15.6 10*3/uL — ABNORMAL HIGH (ref 4.0–10.5)
WBC: 22.2 10*3/uL — ABNORMAL HIGH (ref 4.0–10.5)
nRBC: 0.1 % (ref 0.0–0.2)
nRBC: 0 % (ref 0.0–0.2)

## 2019-07-28 MED ORDER — ACETAMINOPHEN 325 MG PO TABS
650.0000 mg | ORAL_TABLET | ORAL | Status: DC | PRN
  Administered 2019-07-28 – 2019-07-30 (×3): 650 mg via ORAL
  Filled 2019-07-28 (×3): qty 2

## 2019-07-28 MED ORDER — MISOPROSTOL 200 MCG PO TABS
ORAL_TABLET | ORAL | Status: AC
  Filled 2019-07-28: qty 5

## 2019-07-28 MED ORDER — OXYCODONE HCL 5 MG PO TABS: 5.0000 mg | ORAL_TABLET | Freq: Four times a day (QID) | ORAL | Status: DC | PRN

## 2019-07-28 MED ORDER — TETANUS-DIPHTH-ACELL PERTUSSIS 5-2.5-18.5 LF-MCG/0.5 IM SUSP: 0.5000 mL | Freq: Once | INTRAMUSCULAR | Status: DC

## 2019-07-28 MED ORDER — COCONUT OIL OIL: 1.0000 "application " | TOPICAL_OIL | Status: DC | PRN

## 2019-07-28 MED ORDER — MISOPROSTOL 200 MCG PO TABS
600.0000 ug | ORAL_TABLET | ORAL | Status: AC
  Administered 2019-07-28: 600 ug via ORAL

## 2019-07-28 MED ORDER — SIMETHICONE 80 MG PO CHEW: 80.0000 mg | CHEWABLE_TABLET | ORAL | Status: DC | PRN

## 2019-07-28 MED ORDER — FENTANYL CITRATE (PF) 100 MCG/2ML IJ SOLN
INTRAMUSCULAR | Status: AC
  Filled 2019-07-28: qty 2

## 2019-07-28 MED ORDER — IBUPROFEN 600 MG PO TABS
600.0000 mg | ORAL_TABLET | Freq: Four times a day (QID) | ORAL | Status: DC
  Administered 2019-07-28 – 2019-07-30 (×7): 600 mg via ORAL
  Filled 2019-07-28 (×7): qty 1

## 2019-07-28 MED ORDER — METHYLERGONOVINE MALEATE 0.2 MG PO TABS
0.2000 mg | ORAL_TABLET | Freq: Four times a day (QID) | ORAL | Status: DC
  Administered 2019-07-28 (×2): 0.2 mg via ORAL
  Filled 2019-07-28 (×2): qty 1

## 2019-07-28 MED ORDER — LEVETIRACETAM ER 500 MG PO TB24
3000.0000 mg | ORAL_TABLET | Freq: Every day | ORAL | Status: DC
  Administered 2019-07-29 – 2019-07-30 (×2): 3000 mg via ORAL
  Filled 2019-07-28 (×2): qty 6

## 2019-07-28 MED ORDER — DIPHENHYDRAMINE HCL 25 MG PO CAPS: 25.0000 mg | ORAL_CAPSULE | Freq: Four times a day (QID) | ORAL | Status: DC | PRN

## 2019-07-28 MED ORDER — FENTANYL CITRATE (PF) 100 MCG/2ML IJ SOLN: 100.0000 ug | Freq: Once | INTRAMUSCULAR | Status: DC

## 2019-07-28 MED ORDER — PRENATAL MULTIVITAMIN CH
1.0000 | ORAL_TABLET | Freq: Every day | ORAL | Status: DC
  Administered 2019-07-29: 1 via ORAL
  Filled 2019-07-28: qty 1

## 2019-07-28 MED ORDER — ZOLPIDEM TARTRATE 5 MG PO TABS: 5.0000 mg | ORAL_TABLET | Freq: Every evening | ORAL | Status: DC | PRN

## 2019-07-28 MED ORDER — WITCH HAZEL-GLYCERIN EX PADS: 1.0000 "application " | MEDICATED_PAD | CUTANEOUS | Status: DC | PRN

## 2019-07-28 MED ORDER — SODIUM CHLORIDE (PF) 0.9 % IJ SOLN
INTRAMUSCULAR | Status: DC | PRN
  Administered 2019-07-28: 12 mL/h via EPIDURAL

## 2019-07-28 MED ORDER — SENNOSIDES-DOCUSATE SODIUM 8.6-50 MG PO TABS
2.0000 | ORAL_TABLET | ORAL | Status: DC
  Administered 2019-07-28 – 2019-07-29 (×2): 2 via ORAL
  Filled 2019-07-28 (×2): qty 2

## 2019-07-28 MED ORDER — ONDANSETRON HCL 4 MG PO TABS: 4.0000 mg | ORAL_TABLET | ORAL | Status: DC | PRN

## 2019-07-28 MED ORDER — LIDOCAINE-EPINEPHRINE (PF) 2 %-1:200000 IJ SOLN
INTRAMUSCULAR | Status: DC | PRN
  Administered 2019-07-28: 3 mL via EPIDURAL
  Administered 2019-07-28: 2 mL via EPIDURAL

## 2019-07-28 MED ORDER — BENZOCAINE-MENTHOL 20-0.5 % EX AERO
1.0000 "application " | INHALATION_SPRAY | CUTANEOUS | Status: DC | PRN
  Administered 2019-07-28: 1 via TOPICAL
  Filled 2019-07-28 (×2): qty 56

## 2019-07-28 MED ORDER — NIFEDIPINE ER OSMOTIC RELEASE 30 MG PO TB24
30.0000 mg | ORAL_TABLET | Freq: Every day | ORAL | Status: DC
  Administered 2019-07-28: 30 mg via ORAL
  Filled 2019-07-28: qty 1

## 2019-07-28 MED ORDER — OXYTOCIN-SODIUM CHLORIDE 30-0.9 UT/500ML-% IV SOLN
1.0000 m[IU]/min | INTRAVENOUS | Status: DC
  Administered 2019-07-28: 2 m[IU]/min via INTRAVENOUS
  Filled 2019-07-28: qty 500

## 2019-07-28 MED ORDER — ONDANSETRON HCL 4 MG/2ML IJ SOLN: 4.0000 mg | INTRAMUSCULAR | Status: DC | PRN

## 2019-07-28 MED ORDER — DIBUCAINE (PERIANAL) 1 % EX OINT: 1.0000 "application " | TOPICAL_OINTMENT | CUTANEOUS | Status: DC | PRN

## 2019-07-28 MED ORDER — HYDRALAZINE HCL 20 MG/ML IJ SOLN
5.0000 mg | Freq: Once | INTRAMUSCULAR | Status: AC
  Administered 2019-07-28: 5 mg via INTRAVENOUS
  Filled 2019-07-28: qty 1

## 2019-07-28 MED ORDER — OXYCODONE HCL 5 MG PO TABS
10.0000 mg | ORAL_TABLET | Freq: Four times a day (QID) | ORAL | Status: DC | PRN
  Administered 2019-07-28 – 2019-07-30 (×7): 10 mg via ORAL
  Filled 2019-07-28 (×7): qty 2

## 2019-07-28 NOTE — Progress Notes (Signed)
Dr. Jerilynn Birkenhead and  Sharen Counter CNM called back to pt room due to continued increased vaginal bleeding with clots. Dr. Leeroy Bock manual remove clots. Pt tolerated procedure well. Vital signs and fundal  checks noted. Orders given for Cytotec 600 mcg po. Will continue to monitor pt.

## 2019-07-28 NOTE — Progress Notes (Signed)
Pt bp 135/105 at 1857 and 145/112 at 1910. Sharen Counter CNM notified. Sharen Counter CNM declines to start Hypertension Protocol at this time. Order given to recheck blood pressure in 30 minutes. Report given to  Narda Amber RN and  notified of next bp check.

## 2019-07-28 NOTE — Progress Notes (Signed)
Patient ID: NABEEHA BADERTSCHER, female   DOB: 22-Jul-1990, 29 y.o.   MRN: [id number]  KELLEEN STOLZE is a 29 y.o. G2P0010 at [redacted]w[redacted]d admitted for induction of labor due to gestational hypertension.  Subjective: Feeling well.  No complaints.  Epidural in place and not feeling contractions.  Objective: BP 130/85   Pulse 71   Temp 98.7 F (37.1 C) (Oral)   Resp 16   LMP 11/10/2018  No intake/output data recorded.  FHT:  FHR: 130 bpm, variability: moderate,  accelerations:  Present,  decelerations:  Absent UC:   irregular, every 2-5 minutes  SVE:   Dilation: 5 Effacement (%): 70 Station: -2 Exam by:: Dr. Chilton Si   Pitocin @ 16 mu/min  Labs: Lab Results  Component Value Date   WBC 15.6 (H) 07/28/2019   HGB 11.3 (L) 07/28/2019   HCT 35.2 (L) 07/28/2019   MCV 75.2 (L) 07/28/2019   PLT 451 (H) 07/28/2019    Assessment / Plan: Induction of labor due to gestational hypertension.  Labor: Progressing well on pitocin.   Fetal Wellbeing:  Category I Pain Control:  Epidural I/D:  GBS negative Anticipated MOD:  Vaginal delivery  Stryder Poitra Genene Churn, MD PGY-2 Resident Family Medicine 07/28/2019, 8:58 AM

## 2019-07-28 NOTE — Progress Notes (Signed)
Called back to room by RN for increased bleeding, additional clots with total EBL, including at delivery,  of 512 ml.  Dr Morene Antu and Dr Chilton Si to room at this time as well.  Fentanyl 100 mcg IV given and Uterine sweep by Dr Morene Antu with additional 500 ml blood loss for total of 1048. Cytotec 600 mcg given orally. Pt stable, CBC ordered for the am. Methergine PO Q 6 to continue.

## 2019-07-28 NOTE — Progress Notes (Signed)
Called to room by RN for increased vaginal bleeding, additional blood loss of ~ 200 ml after delivery when pt up to bathroom.  Uterus firm, 1 below umbilicus, small trickle of bleeding with fundal exam.  Consult Dr Alysia Penna, pt with gHTN but mild BPs so methergine PO now and Q 6 hours x 4.    Add oxycodone for breakthrough pain.

## 2019-07-28 NOTE — Progress Notes (Signed)
Patient ID: Patricia Irwin, female   DOB: 02/19/1991, 29 y.o.   MRN: [id number]  Patricia Irwin is a 28 y.o. G2P0010 at [redacted]w[redacted]d admitted for induction of labor due to gestational hypertension.  Subjective: Feeling well.  No complaints.  Desires AROM.   Objective: BP 124/83   Pulse 64   Temp 98.7 F (37.1 C) (Oral)   Resp 16   LMP 11/10/2018  No intake/output data recorded.  FHT:  FHR: 135 bpm, variability: moderate,  accelerations:  Present,  decelerations:  Absent UC:   regular, every 3-4 minutes  SVE:   Dilation: 7 Effacement (%): 90 Station: -2 Exam by:: Dr. Chilton Si  Pitocin @ 16 mu/min  Labs: Lab Results  Component Value Date   WBC 15.6 (H) 07/28/2019   HGB 11.3 (L) 07/28/2019   HCT 35.2 (L) 07/28/2019   MCV 75.2 (L) 07/28/2019   PLT 451 (H) 07/28/2019    Assessment / Plan: Induction of labor due togestational hypertension.  Labor:Progressing well on pitocin.  AROMed with clear fluid this check at 1115. Fetal Wellbeing:Category I Pain Control:Epidural I/D:GBS negative Anticipated WNP:IOPPUGG delivery  Noor Witte Genene Churn, MD PGY-2 Resident Family Medicine 07/28/2019, 11:51 AM

## 2019-07-28 NOTE — Progress Notes (Signed)
Vitals:   07/28/19 0533 07/28/19 0601  BP: 132/79 128/87  Pulse: 64 91  Resp: 13 15  Temp:     Pt comfortable w/epidural.  Cx 4.5/70/-2 at 0430.  Pitocin started.  FHR Cat 1. Ctx q 2-4 minutes. Continue to increase pitocin until labor is adequate.

## 2019-07-28 NOTE — Discharge Summary (Signed)
   Postpartum Discharge Summary  Date of Service updated 07/30/19     Patient Name: Patricia Irwin DOB: 02/18/1991 MRN: [id number]  Date of admission: 07/27/2019 Delivery date:07/28/2019  Delivering provider: GREEN, EMILY M  Date of discharge: 07/30/2019  Admitting diagnosis: Pregnant [Z34.90] Intrauterine pregnancy: [redacted]w[redacted]d     Secondary diagnosis:  Active Problems:   History of seizures   History of posttraumatic stress disorder (PTSD)   Supervision of normal pregnancy   Smoker   Anxiety   Pregnant   Postpartum hemorrhage   Encounter for initial prescription of etonogestrel contraceptive single-rod subdermal contraceptive implant  Additional problems: None    Discharge diagnosis: Term Pregnancy Delivered                                              Post partum procedures:Uterine sweep, Fereheme Augmentation: AROM, Pitocin, Cytotec and IP Foley Complications: None  Hospital course: Induction of Labor With Vaginal Delivery   28 y.o. yo G2P0010 at [redacted]w[redacted]d was admitted to the hospital 07/27/2019 for induction of labor.  Indication for induction: Gestational hypertension.  Patient had an uncomplicated labor course as follows: Membrane Rupture Time/Date: 11:15 AM ,07/28/2019   Delivery Method:Vaginal, Spontaneous  Episiotomy: None  Lacerations:  2nd degree  Details of delivery can be found in separate delivery note.  Patient had a routine postpartum course. Patient is discharged home 07/30/19.  Newborn Data: Birth date:07/28/2019  Birth time:2:04 PM  Gender:Female  Living status:Living  Apgars:8 ,9  Weight:2525 g   Magnesium Sulfate received: No BMZ received: No Rhophylac:No MMR:No T-DaP:Given prenatally Flu: No Transfusion:No  Physical exam  Vitals:   07/29/19 0731 07/29/19 1135 07/29/19 2132 07/30/19 0545  BP: 139/87 (!) 146/89 129/90 119/77  Pulse: 67 67 93 81  Resp: 19  18   Temp: 98.4 F (36.9 C) 97.7 F (36.5 C) 98.3 F (36.8 C) 98.2 F (36.8 C)  TempSrc: Oral  Oral  Oral  SpO2:   100% 97%   General: alert, cooperative and no distress Lochia: appropriate Uterine Fundus: firm Incision: N/A DVT Evaluation: No evidence of DVT seen on physical exam. Labs: Lab Results  Component Value Date   WBC 14.9 (H) 07/29/2019   HGB 8.4 (L) 07/29/2019   HCT 27.3 (L) 07/29/2019   MCV 76.5 (L) 07/29/2019   PLT 377 07/29/2019   CMP Latest Ref Rng & Units 07/27/2019  Glucose 70 - 99 mg/dL 111(H)  BUN 6 - 20 mg/dL 5(L)  Creatinine 0.44 - 1.00 mg/dL 0.46  Sodium 135 - 145 mmol/L 135  Potassium 3.5 - 5.1 mmol/L 3.6  Chloride 98 - 111 mmol/L 107  CO2 22 - 32 mmol/L 18(L)  Calcium 8.9 - 10.3 mg/dL 8.8(L)  Total Protein 6.5 - 8.1 g/dL 5.9(L)  Total Bilirubin 0.3 - 1.2 mg/dL 0.3  Alkaline Phos 38 - 126 U/L 122  AST 15 - 41 U/L 29  ALT 0 - 44 U/L 23   Edinburgh Score: Edinburgh Postnatal Depression Scale Screening Tool 07/29/2019  I have been able to laugh and see the funny side of things. 0  I have looked forward with enjoyment to things. 0  I have blamed myself unnecessarily when things went wrong. 1  I have been anxious or worried for no good reason. 1  I have felt scared or panicky for no good reason. 0  Things have been   getting on top of me. 1  I have been so unhappy that I have had difficulty sleeping. 0  I have felt sad or miserable. 0  I have been so unhappy that I have been crying. 0  The thought of harming myself has occurred to me. 0  Edinburgh Postnatal Depression Scale Total 3     After visit meds:  Allergies as of 07/30/2019      Reactions   Dilantin [phenytoin] Hives   Bee Venom    Celexa [citalopram Hydrobromide] Other (See Comments)   Suicidal ideation   Cinnamon Swelling   Influenza Vaccines Other (See Comments)   Cough   Metronidazole Hives, Nausea Only   Tape    Latex Hives, Rash   Trintellix [vortioxetine] Rash      Medication List    STOP taking these medications   Blood Pressure Monitor Misc     TAKE these medications    ibuprofen 600 MG tablet Commonly known as: ADVIL Take 1 tablet (600 mg total) by mouth every 6 (six) hours.   levETIRAcetam 500 MG 24 hr tablet Commonly known as: Keppra XR Take 2 tablets (1,000 mg total) by mouth daily. What changed: how much to take   NIFEdipine 60 MG 24 hr tablet Commonly known as: ADALAT CC Take 1 tablet (60 mg total) by mouth daily. Start taking on: July 31, 2019   oxyCODONE-acetaminophen 5-325 MG tablet Commonly known as: Percocet Take 1 tablet by mouth every 6 (six) hours as needed for severe pain.   PRENATAL VITAMIN PO Take by mouth 2 (two) times daily.   UNABLE TO FIND CBD oil- several times a day        Discharge home in stable condition Infant Feeding: Bottle Infant Disposition:home with mother Discharge instruction: per After Visit Summary and Postpartum booklet. Activity: Advance as tolerated. Pelvic rest for 6 weeks.  Diet: routine diet Future Appointments:No future appointments. Follow up Visit: Follow-up Information    [facility] Follow up.   Contact information: [address] [place] [address] [phone]          Message sent to [facility] on 07/28/19: Please schedule this patient for a Virtual postpartum visit in 4 weeks with the following provider: Any provider. Additional Postpartum F/U:BP check 2-3 days  Low risk pregnancy complicated by: HTN Delivery mode:  Vaginal, Spontaneous  Anticipated Birth Control:  PP Nexplanon placed   07/30/2019 Lisa Leftwich-Kirby, CNM   

## 2019-07-28 NOTE — Progress Notes (Signed)
Sharen Counter CNM at pt bedside due to increased vaginal bleeding. Methergine 0.2mg  po ordered by  Corky Sing.  Misty Stanley Leftwich- Craige Cotta CNM notified of pt's increased blood pressures.  Misty Stanley Leftwich-Kirby advised to go ahead and give Methergine 0.2mg  po to pt. Will continue to monitor pt.

## 2019-07-28 NOTE — Progress Notes (Signed)
Morning dose of penicillin missed, this RN's error as dose was forgotten. Pt was adequately treated with multiple doses prior to this missed dose. Sharen Counter made aware, nothing to be done at this time as pt was adequately treated prior to delivery.

## 2019-07-28 NOTE — Progress Notes (Signed)
Pt having increased vaginal bleeding with small clots and c/o dizziness ambulating to the bathroom for the second time. Emergency alarm pulled. Pt steady back to bed with second staff member for assistance. Dr Ronna Polio notified and stated a provider would come to bed side to evaluate pt. Will continue to monitor pt.

## 2019-07-28 NOTE — Anesthesia Preprocedure Evaluation (Signed)
Anesthesia Evaluation  Patient identified by MRN, date of birth, ID band Patient awake    Reviewed: Allergy & Precautions, NPO status , Patient's Chart, lab work & pertinent test results  Airway Mallampati: II  TM Distance: >3 FB Neck ROM: Full    Dental no notable dental hx.    Pulmonary neg pulmonary ROS, Current SmokerPatient did not abstain from smoking.,    Pulmonary exam normal breath sounds clear to auscultation       Cardiovascular hypertension, Normal cardiovascular exam Rhythm:Regular Rate:Normal     Neuro/Psych  Headaches, Seizures - (last seizure 3 months ago, compliant with anti-epileptics),  PSYCHIATRIC DISORDERS Anxiety    GI/Hepatic negative GI ROS, Neg liver ROS,   Endo/Other  negative endocrine ROS  Renal/GU negative Renal ROS  negative genitourinary   Musculoskeletal negative musculoskeletal ROS (+)   Abdominal   Peds  Hematology negative hematology ROS (+)   Anesthesia Other Findings IOL for gHTN  Reproductive/Obstetrics (+) Pregnancy                             Anesthesia Physical Anesthesia Plan  ASA: III  Anesthesia Plan: Epidural   Post-op Pain Management:    Induction:   PONV Risk Score and Plan: Treatment may vary due to age or medical condition  Airway Management Planned: Natural Airway  Additional Equipment:   Intra-op Plan:   Post-operative Plan:   Informed Consent: I have reviewed the patients History and Physical, chart, labs and discussed the procedure including the risks, benefits and alternatives for the proposed anesthesia with the patient or authorized representative who has indicated his/her understanding and acceptance.       Plan Discussed with: Anesthesiologist  Anesthesia Plan Comments: (Patient identified. Risks, benefits, options discussed with patient including but not limited to bleeding, infection, nerve damage, paralysis,  failed block, incomplete pain control, headache, blood pressure changes, nausea, vomiting, reactions to medication, itching, and post partum back pain. Confirmed with bedside nurse the patient's most recent platelet count. Confirmed with the patient that they are not taking any anticoagulation, have any bleeding history or any family history of bleeding disorders. Patient expressed understanding and wishes to proceed. All questions were answered. )        Anesthesia Quick Evaluation

## 2019-07-28 NOTE — Anesthesia Procedure Notes (Signed)
Epidural Patient location during procedure: OB Start time: 07/28/2019 2:05 AM End time: 07/28/2019 2:20 AM  Staffing Anesthesiologist: Elmer Picker, MD Performed: anesthesiologist   Preanesthetic Checklist Completed: patient identified, IV checked, risks and benefits discussed, monitors and equipment checked, pre-op evaluation and timeout performed  Epidural Patient position: sitting Prep: DuraPrep and site prepped and draped Patient monitoring: continuous pulse ox, blood pressure, heart rate and cardiac monitor Approach: midline Location: L3-L4 Injection technique: LOR air  Needle:  Needle type: Tuohy  Needle gauge: 17 G Needle length: 9 cm Needle insertion depth: 6 cm Catheter type: closed end flexible Catheter size: 19 Gauge Catheter at skin depth: 12 cm Test dose: negative  Assessment Sensory level: T8 Events: blood not aspirated, injection not painful, no injection resistance, no paresthesia and negative IV test  Additional Notes Patient identified. Risks/Benefits/Options discussed with patient including but not limited to bleeding, infection, nerve damage, paralysis, failed block, incomplete pain control, headache, blood pressure changes, nausea, vomiting, reactions to medication both or allergic, itching and postpartum back pain. Confirmed with bedside nurse the patient's most recent platelet count. Confirmed with patient that they are not currently taking any anticoagulation, have any bleeding history or any family history of bleeding disorders. Patient expressed understanding and wished to proceed. All questions were answered. Sterile technique was used throughout the entire procedure. Please see nursing notes for vital signs. Test dose was given through epidural catheter and negative prior to continuing to dose epidural or start infusion. Warning signs of high block given to the patient including shortness of breath, tingling/numbness in hands, complete motor block, or  any concerning symptoms with instructions to call for help. Patient was given instructions on fall risk and not to get out of bed. All questions and concerns addressed with instructions to call with any issues or inadequate analgesia.  Reason for block:procedure for pain

## 2019-07-29 DIAGNOSIS — Z30017 Encounter for initial prescription of implantable subdermal contraceptive: Secondary | ICD-10-CM

## 2019-07-29 LAB — CBC
HCT: 27.3 % — ABNORMAL LOW (ref 36.0–46.0)
Hemoglobin: 8.4 g/dL — ABNORMAL LOW (ref 12.0–15.0)
MCH: 23.5 pg — ABNORMAL LOW (ref 26.0–34.0)
MCHC: 30.8 g/dL (ref 30.0–36.0)
MCV: 76.5 fL — ABNORMAL LOW (ref 80.0–100.0)
Platelets: 377 10*3/uL (ref 150–400)
RBC: 3.57 MIL/uL — ABNORMAL LOW (ref 3.87–5.11)
RDW: 15.8 % — ABNORMAL HIGH (ref 11.5–15.5)
WBC: 14.9 10*3/uL — ABNORMAL HIGH (ref 4.0–10.5)
nRBC: 0 % (ref 0.0–0.2)

## 2019-07-29 MED ORDER — ETONOGESTREL 68 MG ~~LOC~~ IMPL
68.0000 mg | DRUG_IMPLANT | Freq: Once | SUBCUTANEOUS | Status: AC
  Administered 2019-07-29: 68 mg via SUBCUTANEOUS
  Filled 2019-07-29: qty 1

## 2019-07-29 MED ORDER — NICOTINE 21 MG/24HR TD PT24
21.0000 mg | MEDICATED_PATCH | Freq: Every day | TRANSDERMAL | Status: DC
  Administered 2019-07-29 – 2019-07-30 (×2): 21 mg via TRANSDERMAL
  Filled 2019-07-29 (×2): qty 1

## 2019-07-29 MED ORDER — LIDOCAINE HCL 1 % IJ SOLN
0.0000 mL | Freq: Once | INTRAMUSCULAR | Status: AC | PRN
  Administered 2019-07-29: 3 mL via INTRADERMAL
  Filled 2019-07-29: qty 20

## 2019-07-29 MED ORDER — NIFEDIPINE ER OSMOTIC RELEASE 30 MG PO TB24
60.0000 mg | ORAL_TABLET | Freq: Every day | ORAL | Status: DC
  Administered 2019-07-29 – 2019-07-30 (×2): 60 mg via ORAL
  Filled 2019-07-29 (×2): qty 2

## 2019-07-29 MED ORDER — SODIUM CHLORIDE 0.9 % IV SOLN
510.0000 mg | Freq: Once | INTRAVENOUS | Status: AC
  Administered 2019-07-29: 510 mg via INTRAVENOUS
  Filled 2019-07-29: qty 17

## 2019-07-29 NOTE — Progress Notes (Signed)
CSW acknowledged consult and attempted to meet with MOB. However, MOB was sleeping when CSW arrived.  CSW will meet with MOB at a later time.  Shweta Aman D. Dortha Kern, MSW, [facility] Clinical Social Worker [phone]

## 2019-07-29 NOTE — Progress Notes (Signed)
POSTPARTUM PROGRESS NOTE  Post Partum Day 1  Subjective:  Patricia Irwin is a 29 y.o. G2P1011 s/p NSVD at [redacted]w[redacted]d.  She reports she is doing well. No acute events overnight. She denies any problems with ambulating, voiding or po intake. Denies nausea or vomiting.  Pain is well controlled.  Lochia is like a period.  Objective: Blood pressure 119/78, pulse 83, temperature 98.6 F (37 C), temperature source Oral, resp. rate 19, last menstrual period 11/10/2018, SpO2 99 %, unknown if currently breastfeeding.  Physical Exam:  General: alert, cooperative and no distress Chest: no respiratory distress Heart:regular rate, distal pulses intact Abdomen: soft, nontender,  Uterine Fundus: firm, appropriately tender DVT Evaluation: No calf swelling or tenderness Extremities: no LE edema Skin: warm, dry  Recent Labs    07/28/19 1526 07/29/19 0512  HGB 11.8* 8.4*  HCT 37.0 27.3*    Assessment/Plan: Patricia Irwin is a 29 y.o. G2P1011 s/p NSVD at [redacted]w[redacted]d   PPD#1 - Doing well  Routine postpartum care PPH: EBL 1048cc, on methergine series overnight but d/c due to elevated BP's, bleeding now normal. Hgb drop 11.8>8.4, accepts Feraheme this AM.  Seizure d/o: cont Keppra 3g XL daily Contraception: Nexplanon, to be placed today Feeding: bottle Dispo: Plan for discharge PPD#2.   LOS: 2 days   Zack Seal, MD/[facility] OB Fellow  07/29/2019, 7:04 AM

## 2019-07-29 NOTE — Progress Notes (Signed)
Called MD on call about patients BP of 139/101 medication Methergine was due but needed to get MD opinion if medication was appropriate at this time due to increasing BP. MD gave the ok to give medication and also ordered 5mg  IV of hydralazine to give once. Patient was acceptant of the plan of care for the medications.

## 2019-07-29 NOTE — Clinical Social Work Maternal (Signed)
CLINICAL SOCIAL WORK MATERNAL/CHILD NOTE  Patient Details  Name: Patricia Irwin MRN: [id number] Date of Birth: 03/21/1990  Date:  2019/11/12  Clinical Social Worker Initiating Note:  Patricia Irwin, MSW, LCSWA Date/Time: Initiated:  07/29/19/1330     Child's Name:  Patricia Irwin   Biological Parents:  Mother, Father(FOB, Patricia Irwin, 01/04/1989)   Need for Interpreter:  None   Reason for Referral:  (PTSD, Anxiety, and Conversion Disorder)   Address:  [address] [place] [address]    Phone number:  [phone] (home)     Additional phone number: [phone]  Household Members/Support Persons (HM/SP):   Household Member/Support Person 1   HM/SP Name Relationship DOB or Age  HM/SP -1 Patricia Irwin FOB/fiance' 01/04/1989  HM/SP -2        HM/SP -3        HM/SP -4        HM/SP -5        HM/SP -6        HM/SP -7        HM/SP -8          Natural Supports (not living in the home):  Friends   Professional Supports: Other (Comment)(Dr. Bernita Raisin,)   Employment: Homemaker   Type of Work:     Education:  [employer] school graduate   Homebound arranged:    Museum/gallery curator Resources:  Medicaid   Other Resources:  Physicist, medical , [place] Considerations Which May Impact Care:  none stated  Strengths:  Ability to meet basic needs , Home prepared for child , Understanding of illness   Psychotropic Medications:         Pediatrician:       Pediatrician List:   [place]      Pediatrician Fax Number:    Risk Factors/Current Problems:      Cognitive State:  Able to Concentrate , Alert    Mood/Affect:  Interested , Happy , Calm    CSW Assessment: CSW received consult for hx of PTSD, Anxiety and Conversion disorder.  CSW met with MOB at bedside to offer support and complete assessment. On arrival CSW introduces self and stated  purpose for visit. MOB was welcoming and engaged during visit.    During assessment, MOB reported history of PTSD, conversion disorder, anxiety, and Bipolar Irwin dx. MOB reported irritability and pseudoseizures as sx. MOB stated sx have been well managed. MOB reported strategies such as deep breathing exercises, behavior recognition and redirection, to-do list, and "finding a positive perspective" have helped her. MOB stated she learning strategies by being in therapy most of her life.  MOB also stated FOB, helps keep her grounded. MOB reports no evidence of sx in six months. MOB reported actively taking  Lexapro, Gabapentin, and Vraylar until two years ago when she was able to discontinue. MOB reported starting Neurontin about a one year ago for anxiety  but discontinued at 3rd trimester for safety reasons. MOB reported current mood as "really happy" and said she is happy to finally be a mom. MOB reported this is her first child.  MOB reports seeing psychiatrist, Dr. Bernita Raisin every 4-6 months for routine visits and maintance. MOB denied any SI, HI, or domestic violence. MOB identifiied FOB and friends as support.   CSW provided education regarding the baby blues period  vs. perinatal mood disorders, discussed treatment and gave resources for mental health follow up if concerns arise.  CSW recommends self-evaluation during the postpartum time period using the New Mom Checklist from Postpartum Progress and encouraged MOB to contact a medical professional if symptoms are noted at any time. MOB agreed and denied any questions.    CSW provided review of Sudden Infant Death Syndrome (SIDS) precautions. MOB confirmed having all needed items for baby including car seat and crib for baby's safe sleep.   CSW identifies no further need for intervention and no barriers to discharge at this time.  CSW Plan/Description:  No Further Intervention Required/No Barriers to Discharge, Sudden Infant Death Syndrome (SIDS)  Education, Perinatal Mood and Anxiety Disorder (PMADs) Education     Adael Culbreath D. Lissa Morales, MSW, [facility] Clinical Social Worker [phone] 12/21/19, 2:39 PM

## 2019-07-29 NOTE — Procedures (Addendum)
Discussed risks of the device including infection, bleeding, bruising, and pain after placement. Benefits include 3 years of reliable contraception. The skin was marked 8 cm & 14 cm proximal to the medial epicondyle of the left upper arm and cleaned with alcohol then iodine swabs. 3 mL of 1% lidocaine with epinephrine was injected through the desired path for Nexplanon. After a few seconds, the area was tested for numbness and then the Nexplanon was inserted at the 8 cm mark and the 14 cm mark used to guide direction of insertion. Bleeding was scant. Steri strips were placed at the insertion site and bandage was placed. The arm was wrapped with Coban and patient was instructed to keep site dry and wrapped overnight.  EMILY Genene Churn, MD PGY-2 Resident Family Medicine 07/29/2019, 12:13 PM   GME ATTESTATION:  I saw and evaluated the patient. I was gloved and supervised the resident for the entire procedure. I agree with the findings and the plan of care as documented in the resident's note.  Marlowe Alt, DO OB Fellow, Faculty [facility], Center for [facility] 07/29/2019 12:38 PM

## 2019-07-29 NOTE — Anesthesia Postprocedure Evaluation (Signed)
Anesthesia Post Note  Patient: Patricia Irwin  Procedure(s) Performed: AN AD HOC LABOR EPIDURAL     Patient location during evaluation: Mother Baby Anesthesia Type: Epidural Level of consciousness: awake and alert Pain management: pain level controlled Vital Signs Assessment: post-procedure vital signs reviewed and stable Respiratory status: spontaneous breathing, nonlabored ventilation and respiratory function stable Cardiovascular status: stable Postop Assessment: no headache, no backache and epidural receding Anesthetic complications: no    Last Vitals:  Vitals:   07/28/19 2316 07/29/19 0359  BP: (!) 139/101 119/78  Pulse: 75 83  Resp: 19 19  Temp: 37 C   SpO2: 99% 99%    Last Pain:  Vitals:   07/29/19 0633  TempSrc:   PainSc: 8    Pain Goal: Patients Stated Pain Goal: 2 (07/28/19 1913)                 [facility]

## 2019-07-30 MED ORDER — NIFEDIPINE ER 60 MG PO TB24: 60.0000 mg | ORAL_TABLET | Freq: Every day | ORAL | 2 refills | Status: DC

## 2019-07-30 MED ORDER — OXYCODONE-ACETAMINOPHEN 5-325 MG PO TABS: 1.0000 | ORAL_TABLET | Freq: Four times a day (QID) | ORAL | 0 refills | Status: AC | PRN

## 2019-07-30 MED ORDER — IBUPROFEN 600 MG PO TABS: 600.0000 mg | ORAL_TABLET | Freq: Four times a day (QID) | ORAL | 0 refills | Status: DC

## 2019-08-04 ENCOUNTER — Telehealth: Payer: Medicare Other

## 2019-08-08 ENCOUNTER — Telehealth (INDEPENDENT_AMBULATORY_CARE_PROVIDER_SITE_OTHER): Payer: Medicare Other | Admitting: *Deleted

## 2019-08-08 VITALS — BP 123/96

## 2019-08-08 DIAGNOSIS — Z0130 Encounter for examination of blood pressure without abnormal findings: Secondary | ICD-10-CM

## 2019-08-08 NOTE — Progress Notes (Signed)
   NURSE VISIT- BLOOD PRESSURE CHECK  SUBJECTIVE:  Patricia Irwin is a 29 y.o. G96P1011 female here for BP check. She is postpartum, delivery date 07/28/19    HYPERTENSION ROS:  Pregnant/postpartum:  . Severe headaches that don't go away with tylenol/other medicines: No  . Visual changes (seeing spots/double/blurred vision) No  . Severe pain under right breast breast or in center of upper chest No  . Severe nausea/vomiting No  . Taking medicines as instructed yes  OBJECTIVE:  There were no vitals taken for this visit.  Appearance alert, well appearing, and in no distress. Patient had not taken her BP med at the time of this visit. Advised patient to check blood pressure later today and send me a message with the reading.   ASSESSMENT: Postpartum  blood pressure check     Pt had not taken BP med at the time of visit today. She will check a bp this evening and send me a message with updated reading.  [name]  08/08/2019 2:56 PM

## 2019-08-21 ENCOUNTER — Ambulatory Visit (INDEPENDENT_AMBULATORY_CARE_PROVIDER_SITE_OTHER): Payer: Medicare Other | Admitting: *Deleted

## 2019-08-21 DIAGNOSIS — Z0130 Encounter for examination of blood pressure without abnormal findings: Secondary | ICD-10-CM

## 2019-08-21 NOTE — Progress Notes (Signed)
   NURSE VISIT- BLOOD PRESSURE CHECK  SUBJECTIVE:  Patricia Irwin is a 29 y.o. G91P1011 female here for BP check. She is postpartum, delivery date 07/28/19    HYPERTENSION ROS:  Postpartum:  . Severe headaches that don't go away with tylenol/other medicines: No  . Visual changes (seeing spots/double/blurred vision) No  . Severe pain under right breast breast or in center of upper chest No  . Severe nausea/vomiting No  . Taking medicines as instructed yes  OBJECTIVE:  BP 133/80 (BP Location: Right Arm, Patient Position: Sitting, Cuff Size: Normal)   Pulse 78   Appearance alert, well appearing, and in no distress and oriented to person, place, and time.  ASSESSMENT: Postpartum  blood pressure check   PLAN: Discussed with Patricia Irwin, CNM, [facility]   Recommendations: stop medicine 2 days before next visit   Follow-up: as scheduled   Patricia Irwin  08/21/2019 4:46 PM

## 2019-09-07 ENCOUNTER — Telehealth (INDEPENDENT_AMBULATORY_CARE_PROVIDER_SITE_OTHER): Payer: Medicare Other | Admitting: Advanced Practice Midwife

## 2019-09-07 ENCOUNTER — Encounter: Payer: Self-pay | Admitting: Advanced Practice Midwife

## 2019-09-07 DIAGNOSIS — Z1332 Encounter for screening for maternal depression: Secondary | ICD-10-CM | POA: Diagnosis not present

## 2019-09-07 DIAGNOSIS — O99355 Diseases of the nervous system complicating the puerperium: Secondary | ICD-10-CM

## 2019-09-07 DIAGNOSIS — G40909 Epilepsy, unspecified, not intractable, without status epilepticus: Secondary | ICD-10-CM

## 2019-09-07 NOTE — Progress Notes (Signed)
I connected with@ on 09/07/19 at 11:50 AM EDT by: mychart and verified that I am speaking with the correct person using two identifiers.  Patient is located at home and provider is located at [facility].     The purpose of this virtual visit is to provide medical care while limiting exposure to the novel coronavirus. I discussed the limitations, risks, security and privacy concerns of performing an evaluation and management service by FT and the availability of in person appointments. I also discussed with the patient that there may be a patient responsible charge related to this service. By engaging in this virtual visit, you consent to the provision of healthcare.  Additionally, you authorize for your insurance to be billed for the services provided during this visit.  The patient expressed understanding and agreed to proceed.  The following staff members participated in the virtual visit:  Faith Rogue, RN  Post Partum Visit Note Subjective:   Patricia Irwin is a 29 y.o. G60P1011 female being evaluated for postpartum followup.  She is 6 weeks postpartum following a normal spontaneous vaginal delivery at  37.1 gestational weeks, IOL for GHTN.  DCd on meds, last dose 2 days ago.  I have fully reviewed the prenatal and intrapartum course; pregnancy complicated by [facility] and PPH (hgb to 8.4).  Postpartum course has been uneventful. Got a nexplanon inpt. Baby is doing well. Baby is feeding by bottle - parent's choice. Bleeding started period, has nexplanon. Bowel function is normal. Bladder function is normal. Patient is sexually active. Contraception method is Nexplanon. Postpartum depression screening: negative.    Review of Systems Pertinent items are noted in HPI.   Objective:   Vitals:   09/07/19 1204  BP: (!) 128/93  Pulse: 80   Self-Obtained       Assessment:    normal postpartum exam. GHTN, resolved  Plan:  Essential components of care per ACOG recommendations:  1.  Mood  and well being: Patient with negative depression screening today. Reviewed local resources for support.  - Patient does not use tobacco.If using tobacco we discussed reduction and for recently cessation risk of relapse - hx of drug use? No   If yes, discussed support systems  2. Infant care and feeding:  -Patient currently breastmilk feeding? No If breastmilk feeding discussed return to work and pumping. If needed, patient was provided letter for work to allow for every 2-3 hr pumping breaks, and to be granted a private location to express breastmilk and refrigerated area to store breastmilk. Reviewed importance of draining breast regularly to support lactation. -Social determinants of health (SDOH) reviewed in EPIC. No concernsThe following needs were identified 3. Sexuality, contraception and birth spacing - Patient does not want a pregnancy in the next year.  Desired family size is what she has now (4 children  Husband has 3 others_.  - Reviewed forms of contraception in tiered fashion. Patient desired nexplanon today.   - Discussed birth spacing of 18 months  4. Sleep and fatigue -Encouraged family/partner/community support of 4 hrs of uninterrupted sleep to help with mood and fatigue  5. Physical Recovery  - Discussed patients delivery and complications - Patient had a 2 degree laceration, perineal healing reviewed. Patient expressed understanding - Patient has urinary incontinence? No Patient was referred to pelvic floor PT  - Patient is safe to resume physical and sexual activity  6.  Health Maintenance - Last pap smear done unsure, thinks it may have been around 3  years and was normal with negative HPV.  7. Seizure disorder: Chronic Disease - PCP follow up  20 minutes of non-face-to-face time spent with the patient    Jacklyn Shell, CNM Center for [employer], [facility]

## 2020-01-10 ENCOUNTER — Other Ambulatory Visit: Payer: Medicare Other | Admitting: Adult Health

## 2020-01-28 ENCOUNTER — Other Ambulatory Visit: Payer: Self-pay

## 2020-01-28 ENCOUNTER — Emergency Department (HOSPITAL_COMMUNITY)
Admission: EM | Admit: 2020-01-28 | Discharge: 2020-01-28 | Disposition: A | Discharge status: Home / Self Care | Payer: Medicare Other | Attending: Emergency Medicine | Admitting: Emergency Medicine

## 2020-01-28 ENCOUNTER — Emergency Department (HOSPITAL_COMMUNITY): Payer: Medicare Other

## 2020-01-28 ENCOUNTER — Encounter (HOSPITAL_COMMUNITY): Payer: Self-pay | Admitting: Emergency Medicine

## 2020-01-28 DIAGNOSIS — F17210 Nicotine dependence, cigarettes, uncomplicated: Secondary | ICD-10-CM | POA: Insufficient documentation

## 2020-01-28 DIAGNOSIS — Z91040 Latex allergy status: Secondary | ICD-10-CM | POA: Insufficient documentation

## 2020-01-28 DIAGNOSIS — S2231XA Fracture of one rib, right side, initial encounter for closed fracture: Secondary | ICD-10-CM | POA: Diagnosis not present

## 2020-01-28 DIAGNOSIS — Y92410 Unspecified street and highway as the place of occurrence of the external cause: Secondary | ICD-10-CM | POA: Insufficient documentation

## 2020-01-28 DIAGNOSIS — R1084 Generalized abdominal pain: Secondary | ICD-10-CM | POA: Insufficient documentation

## 2020-01-28 DIAGNOSIS — S2232XA Fracture of one rib, left side, initial encounter for closed fracture: Secondary | ICD-10-CM

## 2020-01-28 DIAGNOSIS — S299XXA Unspecified injury of thorax, initial encounter: Secondary | ICD-10-CM | POA: Diagnosis present

## 2020-01-28 LAB — PREGNANCY, URINE: Preg Test, Ur: NEGATIVE

## 2020-01-28 LAB — URINALYSIS, ROUTINE W REFLEX MICROSCOPIC
Bilirubin Urine: NEGATIVE
Glucose, UA: 50 mg/dL — AB
Hgb urine dipstick: NEGATIVE
Ketones, ur: NEGATIVE mg/dL
Nitrite: NEGATIVE
Protein, ur: NEGATIVE mg/dL
Specific Gravity, Urine: 1.017 (ref 1.005–1.030)
pH: 6 (ref 5.0–8.0)

## 2020-01-28 MED ORDER — HYDROCODONE-ACETAMINOPHEN 5-325 MG PO TABS
1.0000 | ORAL_TABLET | Freq: Once | ORAL | Status: AC
  Administered 2020-01-28: 1 via ORAL
  Filled 2020-01-28: qty 1

## 2020-01-28 MED ORDER — HYDROCODONE-ACETAMINOPHEN 5-325 MG PO TABS: 2.0000 | ORAL_TABLET | ORAL | 0 refills | Status: DC | PRN
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
## 2020-01-30 MED FILL — Hydrocodone-Acetaminophen Tab 5-325 MG: ORAL | Qty: 6 | Status: AC

## 2022-01-06 ENCOUNTER — Emergency Department (HOSPITAL_COMMUNITY): Payer: Medicare Other

## 2022-01-06 ENCOUNTER — Encounter (HOSPITAL_COMMUNITY): Payer: Self-pay

## 2022-01-06 ENCOUNTER — Emergency Department (HOSPITAL_COMMUNITY)
Admission: EM | Admit: 2022-01-06 | Discharge: 2022-01-06 | Disposition: A | Discharge status: Home / Self Care | Payer: Medicare Other | Attending: Emergency Medicine | Admitting: Emergency Medicine

## 2022-01-06 ENCOUNTER — Other Ambulatory Visit: Payer: Self-pay

## 2022-01-06 DIAGNOSIS — M62838 Other muscle spasm: Secondary | ICD-10-CM

## 2022-01-06 DIAGNOSIS — M542 Cervicalgia: Secondary | ICD-10-CM | POA: Diagnosis not present

## 2022-01-06 DIAGNOSIS — Z91040 Latex allergy status: Secondary | ICD-10-CM | POA: Insufficient documentation

## 2022-01-06 DIAGNOSIS — H6690 Otitis media, unspecified, unspecified ear: Secondary | ICD-10-CM

## 2022-01-06 DIAGNOSIS — H9201 Otalgia, right ear: Secondary | ICD-10-CM | POA: Diagnosis not present

## 2022-01-06 MED ORDER — DIAZEPAM 5 MG PO TABS
5.0000 mg | ORAL_TABLET | Freq: Once | ORAL | Status: AC
  Administered 2022-01-06: 5 mg via ORAL
  Filled 2022-01-06: qty 1

## 2022-01-06 MED ORDER — IBUPROFEN 400 MG PO TABS
600.0000 mg | ORAL_TABLET | Freq: Once | ORAL | Status: AC
  Administered 2022-01-06: 600 mg via ORAL
  Filled 2022-01-06: qty 2

## 2022-01-06 MED ORDER — NAPROXEN 375 MG PO TABS: 375.0000 mg | ORAL_TABLET | Freq: Two times a day (BID) | ORAL | 0 refills | Status: DC

## 2022-01-06 MED ORDER — CYCLOBENZAPRINE HCL 10 MG PO TABS: 10.0000 mg | ORAL_TABLET | Freq: Two times a day (BID) | ORAL | 0 refills | Status: DC | PRN

## 2022-01-06 MED ORDER — CEPHALEXIN 500 MG PO CAPS: 500.0000 mg | ORAL_CAPSULE | Freq: Four times a day (QID) | ORAL | 0 refills | Status: DC

## 2022-01-06 NOTE — Discharge Instructions (Addendum)
Please refer to the attached instructions. Take medication as directed. Follow-up with your PCP.

## 2022-01-06 NOTE — ED Triage Notes (Signed)
Pt presents to ED with complaints of neck pain started yesterday, denies injury. Pt went to UC and was sent to ED for CT

## 2022-01-06 NOTE — ED Provider Notes (Signed)
[facility] EMERGENCY DEPARTMENT Provider Note   CSN: [id number] Arrival date & time: 01/06/22  1158     History  Chief Complaint  Patient presents with   Neck Pain    Patricia Irwin is a 31 y.o. female.  31 year old female reporting onset of right sided neck pain that started yesterday. Pain seems to originate in the right ear, with some discomfort behind the ear. Patient sent from urgent care with concern for mastoiditis.  The history is provided by the patient. No language interpreter was used.  Neck Pain Pain location:  R side      Home Medications Prior to Admission medications   Medication Sig Start Date End Date Taking? Authorizing Provider  HYDROcodone-acetaminophen (NORCO/VICODIN) 5-325 MG tablet Take 2 tablets by mouth every 4 (four) hours as needed. 01/28/20   Henderly, Britni A, PA-C  ibuprofen (ADVIL) 600 MG tablet Take 1 tablet (600 mg total) by mouth every 6 (six) hours. Patient not taking: Reported on 08/08/2019 07/30/19   Hurshel Party, CNM  levETIRAcetam (KEPPRA XR) 500 MG 24 hr tablet Take 2 tablets (1,000 mg total) by mouth daily. Patient taking differently: Take 3,000 mg by mouth daily.  04/07/13   Triplett, Tammy, PA-C  NIFEdipine (ADALAT CC) 60 MG 24 hr tablet Take 1 tablet (60 mg total) by mouth daily. Patient not taking: Reported on 09/07/2019 07/31/19   Hurshel Party, CNM  Prenatal Vit-Fe Fumarate-FA (PRENATAL VITAMIN PO) Take by mouth 2 (two) times daily. Patient not taking: Reported on 09/07/2019    [provider]  UNABLE TO FIND CBD oil- several times a day Patient not taking: Reported on 08/08/2019    [provider]      Allergies    Dilantin [phenytoin], Bee venom, Celexa [citalopram hydrobromide], Cinnamon, Influenza vaccines, Metronidazole, Tape, Latex, and Trintellix [vortioxetine]    Review of Systems   Review of Systems  HENT:  Positive for ear pain. Negative for hearing loss and tinnitus.    Musculoskeletal:  Positive for neck pain.  All other systems reviewed and are negative.   Physical Exam Updated Vital Signs BP (!) 141/111 (BP Location: Right Arm)   Pulse 97   Temp 98.3 F (36.8 C) (Oral)   Resp 14   Ht 5\' 3"  (1.6 m)   Wt 58.1 kg   SpO2 100%   BMI 22.67 kg/m  Physical Exam Constitutional:      Appearance: Normal appearance.  HENT:     Head: Atraumatic.     Right Ear: Hearing normal. Tenderness present. Tympanic membrane is injected.     Left Ear: Hearing and tympanic membrane normal. No mastoid tenderness.     Nose: Nose normal.     Mouth/Throat:     Mouth: Mucous membranes are moist.  Eyes:     Conjunctiva/sclera: Conjunctivae normal.  Neck:     Comments: Increased discomfort with lateral rotation and flexion of chin. No meningeal signs. Cardiovascular:     Rate and Rhythm: Normal rate.  Pulmonary:     Effort: Pulmonary effort is normal.  Abdominal:     Palpations: Abdomen is soft.  Musculoskeletal:        General: Normal range of motion.     Cervical back: Tenderness present.  Lymphadenopathy:     Cervical: No cervical adenopathy.  Skin:    General: Skin is warm and dry.  Neurological:     Mental Status: She is alert and oriented to person, place, and time.  Psychiatric:        Mood and Affect: Mood normal.        Behavior: Behavior normal.     ED Results / Procedures / Treatments   Labs (all labs ordered are listed, but only abnormal results are displayed) Labs Reviewed - No data to display  EKG None  Radiology CT Temporal Bones Wo Contrast  Result Date: 01/06/2022 CLINICAL DATA:  Pain behind the left ear extending to the neck. EXAM: CT TEMPORAL BONES WITHOUT CONTRAST TECHNIQUE: Axial and coronal plane CT imaging of the petrous temporal bones was performed with thin-collimation image reconstruction. No intravenous contrast was administered. Multiplanar CT image reconstructions were also generated. RADIATION DOSE REDUCTION: This  exam was performed according to the departmental dose-optimization program which includes automated exposure control, adjustment of the mA and/or kV according to patient size and/or use of iterative reconstruction technique. COMPARISON:  Head CT 03/24/2015 FINDINGS: RIGHT TEMPORAL BONE External auditory canal: Normal Middle ear cavity: Normal Inner ear structures: Normal Internal auditory and facial nerve canals:  Normal Mastoid air cells: Normal LEFT TEMPORAL BONE External auditory canal: Normal an widely patent. Normal appearing tympanic membrane. Middle ear cavity: Normal ossicular chain. No middle ear fluid or cholesteatoma. Attic is clear. Inner ear structures: Normal.  No acquired or congenital finding. Internal auditory and facial nerve canals:  Normal by CT. Mastoid air cells: Mastoid air cells entirely clear. No evidence of regional inflammatory change. Vascular: Normal Limited intracranial:  Normal Visible orbits/paranasal sinuses: Normal Soft tissues: No other soft tissue finding. Specific attention to the region of the left ear and retro auricular region does not show any finding to explain the clinical presentation. IMPRESSION: Normal examination. No abnormality seen to explain the clinical presentation. Specific attention to the region of the left ear and retro auricular region does not show any finding to explain the clinical presentation. Electronically Signed   By: Paulina Fusi M.D.   On: 01/06/2022 15:05    Procedures Procedures    Medications Ordered in ED Medications - No data to display  ED Course/ Medical Decision Making/ A&P                           Medical Decision Making Exam and workup most consistent with AOM. Will treat with keflex (has PCN allergy).  Radiology results do not reveal indication of mastoiditis. Right lateral SCM tenderness. Will treat with NSAID and muscle relaxant. No current concern for neck space infection or meningitis.  Amount and/or Complexity of Data  Reviewed Labs: ordered. Radiology: ordered.  Risk Prescription drug management.           Final Clinical Impression(s) / ED Diagnoses Final diagnoses:  None    Rx / DC Orders ED Discharge Orders     None         Felicie Morn, NP 01/06/22 1844    Terrilee Files, MD 01/07/22 1714

## 2022-01-07 ENCOUNTER — Ambulatory Visit: Payer: Self-pay

## 2022-01-07 ENCOUNTER — Telehealth: Payer: Medicare Other | Admitting: Physician Assistant

## 2022-01-07 DIAGNOSIS — G44201 Tension-type headache, unspecified, intractable: Secondary | ICD-10-CM

## 2022-01-07 MED ORDER — METHYLPREDNISOLONE 4 MG PO TBPK: ORAL_TABLET | ORAL | 0 refills | Status: DC

## 2022-01-07 NOTE — Telephone Encounter (Signed)
  Chief Complaint: severe pain  Symptoms: pain in R side of Head and neck 7/10,  Frequency: 3 days  Pertinent Negatives: NA Disposition: [] ED /[] Urgent Care (no appt availability in office) / [] Appointment(In office/virtual)/ [x]  [place] [facility]/ [] Home Care/ [] Refused Recommended Disposition /[] [place]/ []  Follow-up with PCP Additional Notes: pt states went to ED yesterday and was told had ear infection and was given abx and pain meds but nothing is helping. Pt states that pain waxes and wanes but is very bothersome. Contacted PCP but no response back. Recommended virtual UC and guided pt thru getting up via Mychart. Appt scheduled at 1730 today.   Reason for Disposition  [1] SEVERE pain AND [2] not improved 2 hours after taking analgesic medication (e.g., ibuprofen or acetaminophen)  Answer Assessment - Initial Assessment Questions 1. LOCATION: "Which ear is involved?"     R ear and whole head 2. ONSET: "When did the ear start hurting"      2-3 days  3. SEVERITY: "How bad is the pain?"  (Scale 1-10; mild, moderate or severe)   - MILD (1-3): doesn't interfere with normal activities    - MODERATE (4-7): interferes with normal activities or awakens from sleep    - SEVERE (8-10): excruciating pain, unable to do any normal activities      7/10  7. OTHER SYMPTOMS: "Do you have any other symptoms?" (e.g., headache, stiff neck, dizziness, vomiting, runny nose, decreased hearing)     HA and neck pain  Protocols used: Earache-A-AH

## 2022-01-07 NOTE — Progress Notes (Signed)
Virtual Visit Consent   Patricia Irwin, you are scheduled for a virtual visit with a [facility] provider today. Just as with appointments in the office, your consent must be obtained to participate. Your consent will be active for this visit and any virtual visit you may have with one of our providers in the next 365 days. If you have a MyChart account, a copy of this consent can be sent to you electronically.  As this is a virtual visit, video technology does not allow for your provider to perform a traditional examination. This may limit your provider's ability to fully assess your condition. If your provider identifies any concerns that need to be evaluated in person or the need to arrange testing (such as labs, EKG, etc.), we will make arrangements to do so. Although advances in technology are sophisticated, we cannot ensure that it will always work on either your end or our end. If the connection with a video visit is poor, the visit may have to be switched to a telephone visit. With either a video or telephone visit, we are not always able to ensure that we have a secure connection.  By engaging in this virtual visit, you consent to the provision of healthcare and authorize for your insurance to be billed (if applicable) for the services provided during this visit. Depending on your insurance coverage, you may receive a charge related to this service.  I need to obtain your verbal consent now. Are you willing to proceed with your visit today? Odalys L Revels has provided verbal consent on 01/07/2022 for a virtual visit (video or telephone). Margaretann Loveless, PA-C  Date: 01/07/2022 5:31 PM  Virtual Visit via Video Note   IMargaretann Loveless, connected with  SHEARON CLONCH  ([id number], 1991-01-31) on 01/07/22 at  5:30 PM EST by a video-enabled telemedicine application and verified that I am speaking with the correct person using two identifiers.  Location: Patient: Virtual Visit Location  Patient: Home Provider: Virtual Visit Location Provider: Home Office   I discussed the limitations of evaluation and management by telemedicine and the availability of in person appointments. The patient expressed understanding and agreed to proceed.    History of Present Illness: Patricia Irwin is a 31 y.o. who identifies as a female who was assigned female at birth, and is being seen today for continued right side head, face and neck pain. Pain has persisted and now having right sided pain of the head and face, instead of just neck pain. She was seen at [facility] yesterday and diagnosed with an ear infection and neck spasms. She was started on Keflex, Naprosyn 375mg  and Flexeril. She reports she has had no improvement in symptoms and that they have worsened to now involve the right side of the back of the head, radiating around the side and into the right face. States ear is not bothering her. She has been monitoring her BP due to recent higher readings, but has been in pain so unknown if BP is artificially elevated at this time. Is going to continue to monitor.   BP 149/105, 141/111 yesterday   Problems:  Patient Active Problem List   Diagnosis Date Noted   Encounter for initial prescription of etonogestrel contraceptive single-rod subdermal contraceptive implant    Pregnant 07/27/2019   Anxiety 02/27/2019   Smoker 02/06/2019   History of seizures 11/10/2016   History of posttraumatic stress disorder (PTSD) 11/10/2016   History of ovarian cyst  06/02/2016    Allergies:  Allergies  Allergen Reactions   Dilantin [Phenytoin] Hives   Bee Venom    Celexa [Citalopram Hydrobromide] Other (See Comments)    Suicidal ideation   Cinnamon Swelling   Influenza Vaccines Other (See Comments)    Cough   Metronidazole Hives and Nausea Only   Tape    Latex Hives and Rash   Trintellix [Vortioxetine] Rash   Medications:  Current Outpatient Medications:    methylPREDNISolone (MEDROL DOSEPAK)  4 MG TBPK tablet, 6 day taper; take as directed on package instructions, Disp: 21 tablet, Rfl: 0   cephALEXin (KEFLEX) 500 MG capsule, Take 1 capsule (500 mg total) by mouth 4 (four) times daily., Disp: 20 capsule, Rfl: 0   cyclobenzaprine (FLEXERIL) 10 MG tablet, Take 1 tablet (10 mg total) by mouth 2 (two) times daily as needed for muscle spasms., Disp: 20 tablet, Rfl: 0   HYDROcodone-acetaminophen (NORCO/VICODIN) 5-325 MG tablet, Take 2 tablets by mouth every 4 (four) hours as needed., Disp: 6 tablet, Rfl: 0   ibuprofen (ADVIL) 600 MG tablet, Take 1 tablet (600 mg total) by mouth every 6 (six) hours. (Patient not taking: Reported on 08/08/2019), Disp: 30 tablet, Rfl: 0   levETIRAcetam (KEPPRA XR) 500 MG 24 hr tablet, Take 2 tablets (1,000 mg total) by mouth daily. (Patient taking differently: Take 3,000 mg by mouth daily. ), Disp: 60 tablet, Rfl: 0   naproxen (NAPROSYN) 375 MG tablet, Take 1 tablet (375 mg total) by mouth 2 (two) times daily., Disp: 20 tablet, Rfl: 0   NIFEdipine (ADALAT CC) 60 MG 24 hr tablet, Take 1 tablet (60 mg total) by mouth daily. (Patient not taking: Reported on 09/07/2019), Disp: 30 tablet, Rfl: 2   Prenatal Vit-Fe Fumarate-FA (PRENATAL VITAMIN PO), Take by mouth 2 (two) times daily. (Patient not taking: Reported on 09/07/2019), Disp: , Rfl:    UNABLE TO FIND, CBD oil- several times a day (Patient not taking: Reported on 08/08/2019), Disp: , Rfl:   Observations/Objective: Patient is well-developed, well-nourished in no acute distress.  Resting comfortably at home.  Head is normocephalic, atraumatic.  No labored breathing.  Speech is clear and coherent with logical content.  Patient is alert and oriented at baseline.    Assessment and Plan: 1. Acute intractable tension-type headache - methylPREDNISolone (MEDROL DOSEPAK) 4 MG TBPK tablet; 6 day taper; take as directed on package instructions  Dispense: 21 tablet; Refill: 0  - Headache appears to be more tension headache  radiating from right neck into right posterior head, right apex , and around right temporal area to forehead. - Continue Flexeril and Keflex - Add Medrol dose pack - Patient unable to pick up until the morning, but has prednisone 10mg  on hand, advised to take 20mg  now, then could take a third at bedtime if still having pain - Avoid NSAIDs (naproxen, ibuprofen) while on steroids - Tylenol is okay to continue as needed for pain - Heating pad to neck - Massage - Stretches - Seek in person evaluation if symptoms fail to improve or if they worsen - Follow up with PCP over BP if not lowering once pain better managed  Follow Up Instructions: I discussed the assessment and treatment plan with the patient. The patient was provided an opportunity to ask questions and all were answered. The patient agreed with the plan and demonstrated an understanding of the instructions.  A copy of instructions were sent to the patient via MyChart unless otherwise noted below.  The patient was advised to call back or seek an in-person evaluation if the symptoms worsen or if the condition fails to improve as anticipated.  Time:  I spent 12 minutes with the patient via telehealth technology discussing the above problems/concerns.    Mar Daring, PA-C

## 2022-01-07 NOTE — Patient Instructions (Addendum)
Patricia Irwin, thank you for joining Mar Daring, PA-C for today's virtual visit.  While this provider is not your primary care provider (PCP), if your PCP is located in our provider database this encounter information will be shared with them immediately following your visit.   [facility] account gives you access to today's visit and all your visits, tests, and labs performed at [facility] " click here if you don't have a [facility] account or go to [email or web address]  Consent: (Patient) Patricia Irwin provided verbal consent for this virtual visit at the beginning of the encounter.  Current Medications:  Current Outpatient Medications:    methylPREDNISolone (MEDROL DOSEPAK) 4 MG TBPK tablet, 6 day taper; take as directed on package instructions, Disp: 21 tablet, Rfl: 0   cephALEXin (KEFLEX) 500 MG capsule, Take 1 capsule (500 mg total) by mouth 4 (four) times daily., Disp: 20 capsule, Rfl: 0   cyclobenzaprine (FLEXERIL) 10 MG tablet, Take 1 tablet (10 mg total) by mouth 2 (two) times daily as needed for muscle spasms., Disp: 20 tablet, Rfl: 0   HYDROcodone-acetaminophen (NORCO/VICODIN) 5-325 MG tablet, Take 2 tablets by mouth every 4 (four) hours as needed., Disp: 6 tablet, Rfl: 0   ibuprofen (ADVIL) 600 MG tablet, Take 1 tablet (600 mg total) by mouth every 6 (six) hours. (Patient not taking: Reported on 08/08/2019), Disp: 30 tablet, Rfl: 0   levETIRAcetam (KEPPRA XR) 500 MG 24 hr tablet, Take 2 tablets (1,000 mg total) by mouth daily. (Patient taking differently: Take 3,000 mg by mouth daily. ), Disp: 60 tablet, Rfl: 0   naproxen (NAPROSYN) 375 MG tablet, Take 1 tablet (375 mg total) by mouth 2 (two) times daily., Disp: 20 tablet, Rfl: 0   NIFEdipine (ADALAT CC) 60 MG 24 hr tablet, Take 1 tablet (60 mg total) by mouth daily. (Patient not taking: Reported on 09/07/2019), Disp: 30 tablet, Rfl: 2   Prenatal Vit-Fe Fumarate-FA (PRENATAL VITAMIN PO),  Take by mouth 2 (two) times daily. (Patient not taking: Reported on 09/07/2019), Disp: , Rfl:    UNABLE TO FIND, CBD oil- several times a day (Patient not taking: Reported on 08/08/2019), Disp: , Rfl:    Medications ordered in this encounter:  Meds ordered this encounter  Medications   methylPREDNISolone (MEDROL DOSEPAK) 4 MG TBPK tablet    Sig: 6 day taper; take as directed on package instructions    Dispense:  21 tablet    Refill:  0    Order Specific Question:   Supervising Provider    Answer:   Chase Picket [id number]     *If you need refills on other medications prior to your next appointment, please contact your pharmacy*  Follow-Up: Call back or seek an in-person evaluation if the symptoms worsen or if the condition fails to improve as anticipated.  [place] [phone]  Other Instructions  Tension Headache, Adult A tension headache is a feeling of pain, pressure, or aching over the front and sides of the head. The pain can be dull, or it can feel tight. There are two types of tension headache: Episodic tension headache. This is when the headaches happen fewer than 15 days a month. Chronic tension headache. This is when the headaches happen more than 15 days a month during a 61-month period. A tension headache can last from 30 minutes to several days. It is the most common kind of headache. Tension headaches are not normally associated with nausea  or vomiting, and they do not get worse with physical activity. What are the causes? The exact cause of this condition is not known. Tension headaches are often triggered by stress, anxiety, or depression. Other triggers may include: Alcohol. Too much caffeine or caffeine withdrawal. Respiratory infections, such as colds, flu, or sinus infections. Dental problems or teeth clenching. Fatigue. Holding your head and neck in the same position for a long period of time, such as while using a  computer. Smoking. Arthritis of the neck. What are the signs or symptoms? Symptoms of this condition include: A feeling of pressure or tightness around the head. Dull, aching head pain. Pain over the front and sides of the head. Tenderness in the muscles of the head, neck, and shoulders. How is this diagnosed? This condition may be diagnosed based on your symptoms, your medical history, and a physical exam. If your symptoms are severe or unusual, you may have imaging tests, such as a CT scan or an MRI of your head. Your vision may also be checked. How is this treated? This condition may be treated with lifestyle changes and with medicines that help relieve symptoms. Follow these instructions at home: Managing pain Take over-the-counter and prescription medicines only as told by your health care provider. When you have a headache, lie down in a dark, quiet room. If directed, put ice on your head and neck. To do this: Put ice in a plastic bag. Place a towel between your skin and the bag. Leave the ice on for 20 minutes, 2-3 times a day. Remove the ice if your skin turns bright red. This is very important. If you cannot feel pain, heat, or cold, you have a greater risk of damage to the area. If directed, apply heat to the back of your neck as often as told by your health care provider. Use the heat source that your health care provider recommends, such as a moist heat pack or a heating pad. Place a towel between your skin and the heat source. Leave the heat on for 20-30 minutes. Remove the heat if your skin turns bright red. This is especially important if you are unable to feel pain, heat, or cold. You have a greater risk of getting burned. Eating and drinking Eat meals on a regular schedule. If you drink alcohol: Limit how much you have to: 0-1 drink a day for women who are not pregnant. 0-2 drinks a day for men. Know how much alcohol is in your drink. In the [place] one drink equals  one 12 oz bottle of beer (355 mL), one 5 oz glass of wine (148 mL), or one 1 oz glass of hard liquor (44 mL). Drink enough fluid to keep your urine pale yellow. Decrease your caffeine intake, or stop using caffeine. Lifestyle Get 7-9 hours of sleep each night, or get the amount of sleep recommended by your health care provider. At bedtime, remove computers, phones, and tablets from your room. Find ways to manage your stress. This may include: Exercise. Deep breathing exercises. Yoga. Listening to music. Positive mental imagery. Try to sit up straight and avoid tensing your muscles. Do not use any products that contain nicotine or tobacco. These include cigarettes, chewing tobacco, and vaping devices, such as e-cigarettes. If you need help quitting, ask your health care provider. General instructions  Avoid any headache triggers. Keep a journal to help find out what may trigger your headaches. For example, write down: What you eat and drink. How much  sleep you get. Any change to your diet or medicines. Keep all follow-up visits. This is important. Contact a health care provider if: Your headache does not get better. Your headache comes back. You are sensitive to sounds, light, or smells because of a headache. You have nausea or you vomit. Your stomach hurts. Get help right away if: You suddenly develop a severe headache, along with any of the following: A stiff neck. Nausea and vomiting. Confusion. Weakness in one part or one side of your body. Double vision or loss of vision. Shortness of breath. Rash. Unusual sleepiness. Fever or chills. Trouble speaking. Pain in your eye or ear. Trouble walking or balancing. Feeling faint or passing out. Summary A tension headache is a feeling of pain, pressure, or aching over the front and sides of the head. A tension headache can last from 30 minutes to several days. It is the most common kind of headache. This condition may be  diagnosed based on your symptoms, your medical history, and a physical exam. This condition may be treated with lifestyle changes and with medicines that help relieve symptoms. This information is not intended to replace advice given to you by your health care provider. Make sure you discuss any questions you have with your health care provider. Document Revised: 11/09/2019 Document Reviewed: 11/09/2019 Elsevier Patient Education  2023 Elsevier Inc.  Neck Exercises Ask your health care provider which exercises are safe for you. Do exercises exactly as told by your health care provider and adjust them as directed. It is normal to feel mild stretching, pulling, tightness, or discomfort as you do these exercises. Stop right away if you feel sudden pain or your pain gets worse. Do not begin these exercises until told by your health care provider. Neck exercises can be important for many reasons. They can improve strength and maintain flexibility in your neck, which will help your upper back and prevent neck pain. Stretching exercises Rotation neck stretching  Sit in a chair or stand up. Place your feet flat on the floor, shoulder-width apart. Slowly turn your head (rotate) to the right until a slight stretch is felt. Turn it all the way to the right so you can look over your right shoulder. Do not tilt or tip your head. Hold this position for 10-30 seconds. Slowly turn your head (rotate) to the left until a slight stretch is felt. Turn it all the way to the left so you can look over your left shoulder. Do not tilt or tip your head. Hold this position for 10-30 seconds. Repeat __________ times. Complete this exercise __________ times a day. Neck retraction  Sit in a sturdy chair or stand up. Look straight ahead. Do not bend your neck. Use your fingers to push your chin backward (retraction). Do not bend your neck for this movement. Continue to face straight ahead. If you are doing the exercise  properly, you will feel a slight sensation in your throat and a stretch at the back of your neck. Hold the stretch for 1-2 seconds. Repeat __________ times. Complete this exercise __________ times a day. Strengthening exercises Neck press  Lie on your back on a firm bed or on the floor with a pillow under your head. Use your neck muscles to push your head down on the pillow and straighten your spine. Hold the position as well as you can. Keep your head facing up (in a neutral position) and your chin tucked. Slowly count to 5 while holding this position.  Repeat __________ times. Complete this exercise __________ times a day. Isometrics These are exercises in which you strengthen the muscles in your neck while keeping your neck still (isometrics). Sit in a supportive chair and place your hand on your forehead. Keep your head and face facing straight ahead. Do not flex or extend your neck while doing isometrics. Push forward with your head and neck while pushing back with your hand. Hold for 10 seconds. Do the sequence again, this time putting your hand against the back of your head. Use your head and neck to push backward against the hand pressure. Finally, do the same exercise on either side of your head, pushing sideways against the pressure of your hand. Repeat __________ times. Complete this exercise __________ times a day. Prone head lifts  Lie face-down (prone position), resting on your elbows so that your chest and upper back are raised. Start with your head facing downward, near your chest. Position your chin either on or near your chest. Slowly lift your head upward. Lift until you are looking straight ahead. Then continue lifting your head as far back as you can comfortably stretch. Hold your head up for 5 seconds. Then slowly lower it to your starting position. Repeat __________ times. Complete this exercise __________ times a day. Supine head lifts  Lie on your back (supine  position), bending your knees to point to the ceiling and keeping your feet flat on the floor. Lift your head slowly off the floor, raising your chin toward your chest. Hold for 5 seconds. Repeat __________ times. Complete this exercise __________ times a day. Scapular retraction  Stand with your arms at your sides. Look straight ahead. Slowly pull both shoulders (scapulae) backward and downward (retraction) until you feel a stretch between your shoulder blades in your upper back. Hold for 10-30 seconds. Relax and repeat. Repeat __________ times. Complete this exercise __________ times a day. Contact a health care provider if: Your neck pain or discomfort gets worse when you do an exercise. Your neck pain or discomfort does not improve within 2 hours after you exercise. If you have any of these problems, stop exercising right away. Do not do the exercises again unless your health care provider says that you can. Get help right away if: You develop sudden, severe neck pain. If this happens, stop exercising right away. Do not do the exercises again unless your health care provider says that you can. This information is not intended to replace advice given to you by your health care provider. Make sure you discuss any questions you have with your health care provider. Document Revised: 08/06/2020 Document Reviewed: 08/06/2020 Elsevier Patient Education  [place].   If you have been instructed to have an in-person evaluation today at a local Urgent Care facility, please use the link below. It will take you to a list of all of our available [facility], including address, phone number and hours of operation. Please do not delay care.  [facility]  If you or a family member do not have a primary care provider, use the link below to schedule a visit and establish care. When you choose a [facility] primary care physician or advanced practice provider, you gain a  long-term partner in health. Find a Primary Care Provider  Learn more about [facility] in-office and virtual care options: [facility]

## 2022-01-12 ENCOUNTER — Telehealth: Payer: Medicare Other | Admitting: Family Medicine

## 2022-01-12 DIAGNOSIS — G44201 Tension-type headache, unspecified, intractable: Secondary | ICD-10-CM

## 2022-01-12 NOTE — Progress Notes (Signed)
Patricia Irwin   Needs to go in person- as Medrol not working. Needs to be seen in person to help get migraine uncontrol.  Patient acknowledged agreement and understanding of the plan.

## 2022-01-19 ENCOUNTER — Other Ambulatory Visit: Payer: Self-pay

## 2022-01-19 ENCOUNTER — Emergency Department (HOSPITAL_COMMUNITY)
Admission: EM | Admit: 2022-01-19 | Discharge: 2022-01-19 | Disposition: A | Discharge status: Home / Self Care | Payer: Medicare Other | Attending: Emergency Medicine | Admitting: Emergency Medicine

## 2022-01-19 DIAGNOSIS — K0889 Other specified disorders of teeth and supporting structures: Secondary | ICD-10-CM | POA: Diagnosis not present

## 2022-01-19 DIAGNOSIS — Z91040 Latex allergy status: Secondary | ICD-10-CM | POA: Insufficient documentation

## 2022-01-19 DIAGNOSIS — R519 Headache, unspecified: Secondary | ICD-10-CM | POA: Diagnosis present

## 2022-01-19 DIAGNOSIS — G43E09 Chronic migraine with aura, not intractable, without status migrainosus: Secondary | ICD-10-CM | POA: Diagnosis not present

## 2022-01-19 MED ORDER — KETOROLAC TROMETHAMINE 15 MG/ML IJ SOLN
15.0000 mg | Freq: Once | INTRAMUSCULAR | Status: AC
  Administered 2022-01-19: 15 mg via INTRAVENOUS
  Filled 2022-01-19: qty 1

## 2022-01-19 MED ORDER — METOCLOPRAMIDE HCL 5 MG/ML IJ SOLN
10.0000 mg | Freq: Once | INTRAMUSCULAR | Status: AC
  Administered 2022-01-19: 10 mg via INTRAVENOUS
  Filled 2022-01-19: qty 2

## 2022-01-19 MED ORDER — SODIUM CHLORIDE 0.9 % IV BOLUS
1000.0000 mL | Freq: Once | INTRAVENOUS | Status: AC
  Administered 2022-01-19: 1000 mL via INTRAVENOUS

## 2022-01-19 NOTE — ED Notes (Signed)
See triage notes. Pt denies n/v/dizziness. States has had darkness in right eye some. Is compliant with seizure meds. Bed rails up. Mother at bedside. Nad.

## 2022-01-19 NOTE — ED Provider Notes (Signed)
[facility] EMERGENCY DEPARTMENT Provider Note   CSN: [id number] Arrival date & time: 01/19/22  1146     History  Chief Complaint  Patient presents with   Headache    Patricia Irwin is a 31 y.o. female with a past medical history of migraine headache, seizures, PTSD presenting to the emergency department for evaluation of headache.  Patient states she has had progressively worsening headache since 11/13.  She states she has been seen recently in the ED and urgent care, was given steroid, antibiotics and pain medication which initially helped but the pain is persistent.  She described the pain as squeezing, intermittent, located on the right side of her head.  Reports sensitivity to light and sound.  History of epilepsy on Keppra.  History of migraine not on any medication.  Patient also reports right dental pain, she got an appointment tomorrow to remove her wisdom tooth.  Endorses dizziness, nausea without vomiting.  Denies any fever, chest pain, shortness of breath, bowel changes, urinary symptoms.   Headache  Past Medical History:  Diagnosis Date   Anxiety    Chronic headache    Chronic knee pain    Hx of electroencephalogram 12/2013 ([facility])   normal, dx non-epileptic seizures   Hypoglycemia    Noncompliance with medications    Pseudoseizures    PTSD (post-traumatic stress disorder)    Seizures (HCC)    Vaginal Pap smear, abnormal    Past Surgical History:  Procedure Laterality Date   NO PAST SURGERIES     WISDOM TOOTH EXTRACTION         Home Medications Prior to Admission medications   Medication Sig Start Date End Date Taking? Authorizing Provider  cyclobenzaprine (FLEXERIL) 10 MG tablet Take 1 tablet (10 mg total) by mouth 2 (two) times daily as needed for muscle spasms. 01/06/22  Yes Felicie Morn, NP  levETIRAcetam (KEPPRA XR) 500 MG 24 hr tablet Take 2 tablets (1,000 mg total) by mouth daily. Patient taking differently: Take 3,000 mg by mouth daily. 04/07/13  Yes  Triplett, Tammy, PA-C  naproxen (NAPROSYN) 375 MG tablet Take 1 tablet (375 mg total) by mouth 2 (two) times daily. 01/06/22  Yes Felicie Morn, NP      Allergies    Dilantin [phenytoin], Bee venom, Celexa [citalopram hydrobromide], Cinnamon, Influenza vaccines, Metronidazole, Tape, Latex, and Trintellix [vortioxetine]    Review of Systems   Review of Systems  Neurological:  Positive for headaches.    Physical Exam Updated Vital Signs BP (!) 138/90 (BP Location: Left Arm)   Pulse 76   Temp 98.5 F (36.9 C) (Oral)   Resp 17   Ht 5\' 3"  (1.6 m)   Wt 58.1 kg   SpO2 97%   BMI 22.69 kg/m  Physical Exam Vitals and nursing note reviewed.  Constitutional:      Appearance: Normal appearance.  HENT:     Head: Normocephalic and atraumatic.     Mouth/Throat:     Mouth: Mucous membranes are moist.  Eyes:     General: No visual field deficit or scleral icterus.    Extraocular Movements: Extraocular movements intact.     Pupils: Pupils are equal, round, and reactive to light.  Cardiovascular:     Rate and Rhythm: Normal rate and regular rhythm.     Pulses: Normal pulses.     Heart sounds: Normal heart sounds.  Pulmonary:     Effort: Pulmonary effort is normal.     Breath sounds: Normal breath sounds.  Abdominal:     General: Abdomen is flat.     Palpations: Abdomen is soft.     Tenderness: There is no abdominal tenderness.  Musculoskeletal:        General: No deformity.     Cervical back: Normal range of motion.  Skin:    General: Skin is warm.     Findings: No rash.  Neurological:     General: No focal deficit present.     Mental Status: She is alert.     GCS: GCS eye subscore is 4. GCS verbal subscore is 5. GCS motor subscore is 6.     Cranial Nerves: No cranial nerve deficit, dysarthria or facial asymmetry.     Sensory: No sensory deficit.     Motor: No weakness.     Coordination: Romberg sign negative. Coordination normal.     Gait: Gait normal.     Deep Tendon  Reflexes: Babinski sign absent on the right side. Babinski sign absent on the left side.  Psychiatric:        Mood and Affect: Mood normal.     ED Results / Procedures / Treatments   Labs (all labs ordered are listed, but only abnormal results are displayed) Labs Reviewed - No data to display  EKG None  Radiology No results found.  Procedures Procedures    Medications Ordered in ED Medications  ketorolac (TORADOL) 15 MG/ML injection 15 mg (15 mg Intravenous Given 01/19/22 1341)  metoCLOPramide (REGLAN) injection 10 mg (10 mg Intravenous Given 01/19/22 1341)  sodium chloride 0.9 % bolus 1,000 mL (0 mLs Intravenous Stopped 01/19/22 1438)    ED Course/ Medical Decision Making/ A&P                           Medical Decision Making Risk Prescription drug management.   This patient presents to the ED for headache for 2 weeks, this involves an extensive number of treatment options, and is a complaint that carries with a high risk of complications and morbidity.  The differential diagnosis includes migraine headache, tension type headache, CVA, infectious etiology.  This is not an exhaustive list.  Comorbidities that complicate the patient evaluation See HPI  Social determinants of health NA  Additional history obtained: Additional history obtained from EMR. External records from outside source obtained and review including prior labs  Cardiac monitoring/EKG: The patient was maintained on a cardiac monitor.  I personally reviewed and interpreted the cardiac monitor which showed an underlying rhythm of: Sinus rhythm.  Lab tests:  Imaging studies:  Problem list/ ED course/ Critical interventions/ Medical management: HPI: See above Vital signs within normal range and stable throughout visit. Laboratory/imaging studies significant for: See above. On physical examination, patient is afebrile and appears in no acute distress.  Neuroexam was unremarkable.  Cranial nerve II  to XII grossly normal.  No focal neurological deficits on exam.  I feel CT is not necessary at this point as CVA is unlikely due to benign neuroexams and patient does not have any neurological deficits.  Patient's symptoms are consistent with migraine headache.  Patient's clinical presentations and laboratory/imaging studies are most concerned for migraine headache.  Toradol, Reglan and fluid bolus ordered. Reevaluation of the patient after these medications showed that the patient resolved.  Advised patient to follow-up with neurology for further evaluation and management.  Return to the ER if new or worsening symptoms. I have reviewed the patient home medicines and have made  adjustments as needed.  Consultations obtained:  Disposition Continued outpatient therapy. Follow-up with PCP or neurology recommended for reevaluation of symptoms. Treatment plan discussed with patient.  Pt acknowledged understanding was agreeable to the plan. Worrisome signs and symptoms were discussed with patient, and patient acknowledged understanding to return to the ED if they noticed these signs and symptoms. Patient was stable upon discharge.   This chart was dictated using voice recognition software.  Despite best efforts to proofread,  errors can occur which can change the documentation meaning.          Final Clinical Impression(s) / ED Diagnoses Final diagnoses:  Chronic migraine with aura without status migrainosus, not intractable    Rx / DC Orders ED Discharge Orders     None         Jeanelle Malling, [place] 01/19/22 2126    Sloan Leiter, DO 01/20/22 [phone]

## 2022-01-19 NOTE — ED Triage Notes (Signed)
Pt presents with headache x 2 weeks, 3rd visit to this ED, was to by UC to come here for migraine cocktail.

## 2022-01-19 NOTE — Discharge Instructions (Addendum)
Please take tylenol/ibuprofen for pain. I recommend close follow-up with PCP or neurology for reevaluation.  Please do not hesitate to return to emergency department if worrisome signs symptoms we discussed become apparent.

## 2022-03-30 ENCOUNTER — Ambulatory Visit (INDEPENDENT_AMBULATORY_CARE_PROVIDER_SITE_OTHER): Payer: Medicare Other | Admitting: Obstetrics & Gynecology

## 2022-03-30 ENCOUNTER — Encounter: Payer: Self-pay | Admitting: Obstetrics & Gynecology

## 2022-03-30 ENCOUNTER — Other Ambulatory Visit (HOSPITAL_COMMUNITY)
Admission: RE | Admit: 2022-03-30 | Discharge: 2022-03-30 | Disposition: A | Discharge status: Home / Self Care | Payer: Medicare Other | Source: Ambulatory Visit | Attending: Obstetrics & Gynecology | Admitting: Obstetrics & Gynecology

## 2022-03-30 VITALS — BP 133/87 | HR 71 | Ht 63.0 in | Wt 135.0 lb

## 2022-03-30 DIAGNOSIS — Z01419 Encounter for gynecological examination (general) (routine) without abnormal findings: Secondary | ICD-10-CM | POA: Diagnosis not present

## 2022-03-30 DIAGNOSIS — Z1151 Encounter for screening for human papillomavirus (HPV): Secondary | ICD-10-CM | POA: Insufficient documentation

## 2022-03-30 NOTE — Progress Notes (Signed)
Subjective:     Patricia Irwin is a 32 y.o. female here for a routine exam.  No LMP recorded. Patient has had an implant. [id number] Birth Control Method:  Nexplanon Menstrual Calendar(currently): irregular to rare  Current complaints: none.   Current acute medical issues:  none   Recent Gynecologic History No LMP recorded. Patient has had an implant. Last Pap: can't seem to locate-->today done,   Last mammogram: na,    Past Medical History:  Diagnosis Date   Anxiety    Chronic headache    Chronic knee pain    Hx of electroencephalogram 12/2013 ([facility])   normal, dx non-epileptic seizures   Hypoglycemia    Noncompliance with medications    Pseudoseizures    PTSD (post-traumatic stress disorder)    Seizures ([address])    Vaginal Pap smear, abnormal     Past Surgical History:  Procedure Laterality Date   NO PAST SURGERIES     WISDOM TOOTH EXTRACTION      OB History     Gravida  2   Para  1   Term  1   Preterm      AB  1   Living  1      SAB  1   IAB      Ectopic      Multiple  0   Live Births  1           Social History   Socioeconomic History   Marital status: Legally Separated    Spouse name: Not on file   Number of children: Not on file   Years of education: Not on file   Highest education level: High school graduate  Occupational History   Not on file  Tobacco Use   Smoking status: Every Day    Packs/day: 1.00    Types: Cigarettes   Smokeless tobacco: Never  Vaping Use   Vaping Use: Never used  Substance and Sexual Activity   Alcohol use: Not Currently    Comment: occasional   Drug use: No   Sexual activity: Yes    Birth control/protection: Implant  Other Topics Concern   Not on file  Social History Narrative   Not on file   Social Determinants of Health   Financial Resource Strain: Low Risk  (03/30/2022)   Overall Financial Resource Strain (CARDIA)    Difficulty of Paying Living Expenses: Not hard at all  Food Insecurity: No  Food Insecurity (03/30/2022)   Hunger Vital Sign    Worried About Running Out of Food in the Last Year: Never true    Ran Out of Food in the Last Year: Never true  Transportation Needs: No Transportation Needs (03/30/2022)   PRAPARE - Hydrologist (Medical): No    Lack of Transportation (Non-Medical): No  Physical Activity: Insufficiently Active (03/30/2022)   Exercise Vital Sign    Days of Exercise per Week: 3 days    Minutes of Exercise per Session: 40 min  Stress: Stress Concern Present (03/30/2022)   [place]    Feeling of Stress : Very much  Social Connections: Moderately Isolated (03/30/2022)   Social Connection and Isolation Panel [NHANES]    Frequency of Communication with Friends and Family: Three times a week    Frequency of Social Gatherings with Friends and Family: Three times a week    Attends Religious Services: Never    Active Member of Clubs  or Organizations: No    Attends Archivist Meetings: Never    Marital Status: Living with partner    Family History  Problem Relation Age of Onset   Diabetes Father    Heart disease Father    Hypertension Father    Diabetes Paternal Grandfather    Hypertension Paternal Grandfather    Diabetes Paternal Grandmother    Hypertension Paternal Grandmother    Ovarian cancer Maternal Grandmother    Diabetes Maternal Grandmother    Diabetes Maternal Grandfather    Bipolar disorder Mother    Cancer Maternal Aunt        skin     Current Outpatient Medications:    levETIRAcetam (KEPPRA XR) 500 MG 24 hr tablet, Take 2 tablets (1,000 mg total) by mouth daily. (Patient taking differently: Take 3,000 mg by mouth daily.), Disp: 60 tablet, Rfl: 0   propranolol ER (INDERAL LA) 60 MG 24 hr capsule, Take 60 mg by mouth daily., Disp: , Rfl:   Review of Systems  Review of Systems  Constitutional: Negative for fever, chills, weight loss,  malaise/fatigue and diaphoresis.  HENT: Negative for hearing loss, ear pain, nosebleeds, congestion, sore throat, neck pain, tinnitus and ear discharge.   Eyes: Negative for blurred vision, double vision, photophobia, pain, discharge and redness.  Respiratory: Negative for cough, hemoptysis, sputum production, shortness of breath, wheezing and stridor.   Cardiovascular: Negative for chest pain, palpitations, orthopnea, claudication, leg swelling and PND.  Gastrointestinal: negative for abdominal pain. Negative for heartburn, nausea, vomiting, diarrhea, constipation, blood in stool and melena.  Genitourinary: Negative for dysuria, urgency, frequency, hematuria and flank pain.  Musculoskeletal: Negative for myalgias, back pain, joint pain and falls.  Skin: Negative for itching and rash.  Neurological: Negative for dizziness, tingling, tremors, sensory change, speech change, focal weakness, seizures, loss of consciousness, weakness and headaches.  Endo/Heme/Allergies: Negative for environmental allergies and polydipsia. Does not bruise/bleed easily.  Psychiatric/Behavioral: Negative for depression, suicidal ideas, hallucinations, memory loss and substance abuse. The patient is not nervous/anxious and does not have insomnia.        Objective:  Blood pressure 133/87, pulse 71, height 5\' 3"  (1.6 m), weight 135 lb (61.2 kg).   Physical Exam  Vitals reviewed. Constitutional: She is oriented to person, place, and time. She appears well-developed and well-nourished.  HENT:  Head: Normocephalic and atraumatic.        Right Ear: External ear normal.  Left Ear: External ear normal.  Nose: Nose normal.  Mouth/Throat: Oropharynx is clear and moist.  Eyes: Conjunctivae and EOM are normal. Pupils are equal, round, and reactive to light. Right eye exhibits no discharge. Left eye exhibits no discharge. No scleral icterus.  Neck: Normal range of motion. Neck supple. No tracheal deviation present. No  thyromegaly present.  Cardiovascular: Normal rate, regular rhythm, normal heart sounds and intact distal pulses.  Exam reveals no gallop and no friction rub.   No murmur heard. Respiratory: Effort normal and breath sounds normal. No respiratory distress. She has no wheezes. She has no rales. She exhibits no tenderness.  GI: Soft. Bowel sounds are normal. She exhibits no distension and no mass. There is no tenderness. There is no rebound and no guarding.  Genitourinary:  Breasts no masses skin changes or nipple changes bilaterally      Vulva is normal without lesions Vagina is pink moist without discharge Cervix normal in appearance and pap is done Uterus is normal size shape and contour Adnexa is negative with normal  sized ovaries   Musculoskeletal: Normal range of motion. She exhibits no edema and no tenderness.  Neurological: She is alert and oriented to person, place, and time. She has normal reflexes. She displays normal reflexes. No cranial nerve deficit. She exhibits normal muscle tone. Coordination normal.  Skin: Skin is warm and dry. No rash noted. No erythema. No pallor.  Psychiatric: She has a normal mood and affect. Her behavior is normal. Judgment and thought content normal.       Medications Ordered at today's visit: No orders of the defined types were placed in this encounter.   Other orders placed at today's visit: No orders of the defined types were placed in this encounter.     Assessment:    Normal Gyn exam.    Plan:    Contraception: Nexplanon. Follow up in: 6 months.     Return in about 19 weeks (around 08/10/2022) for nexplanon removal and replacement.

## 2022-04-02 LAB — CYTOLOGY - PAP
Chlamydia: NEGATIVE
Comment: NEGATIVE
Comment: NEGATIVE
Comment: NORMAL
Diagnosis: NEGATIVE
High risk HPV: NEGATIVE
Neisseria Gonorrhea: NEGATIVE

## 2022-08-03 ENCOUNTER — Ambulatory Visit (INDEPENDENT_AMBULATORY_CARE_PROVIDER_SITE_OTHER): Payer: Medicare Other | Admitting: Adult Health

## 2022-08-03 ENCOUNTER — Encounter: Payer: Self-pay | Admitting: Adult Health

## 2022-08-03 VITALS — BP 130/93 | HR 69 | Ht 63.0 in | Wt 138.0 lb

## 2022-08-03 DIAGNOSIS — Z3202 Encounter for pregnancy test, result negative: Secondary | ICD-10-CM | POA: Diagnosis not present

## 2022-08-03 DIAGNOSIS — Z3046 Encounter for surveillance of implantable subdermal contraceptive: Secondary | ICD-10-CM | POA: Diagnosis not present

## 2022-08-03 LAB — POCT URINE PREGNANCY: Preg Test, Ur: NEGATIVE

## 2022-08-03 MED ORDER — ETONOGESTREL 68 MG ~~LOC~~ IMPL
68.0000 mg | DRUG_IMPLANT | Freq: Once | SUBCUTANEOUS | Status: AC
  Administered 2022-08-03: 68 mg via SUBCUTANEOUS

## 2022-08-03 NOTE — Progress Notes (Signed)
  Subjective:     Patient ID: Patricia Irwin, female   DOB: 04-21-1990, 32 y.o.   MRN: [id number]  HPI Patricia Irwin is a 32 year old white female, separated, has SO, G2P1011 in for nexplanon removal and reinsertion.     Component Value Date/Time   DIAGPAP  03/30/2022 1333    - Negative for intraepithelial lesion or malignancy (NILM)   HPVHIGH Negative 03/30/2022 1333   ADEQPAP  03/30/2022 1333    Satisfactory for evaluation; transformation zone component PRESENT.    PCP is [facility]  Review of Systems For nexplanon removal and reinsertion Reviewed past medical,surgical, social and family history. Reviewed medications and allergies.     Objective:   Physical Exam BP (!) 130/93 (BP Location: Right Arm, Patient Position: Sitting, Cuff Size: Normal)   Pulse 69   Ht 5\' 3"  (1.6 m)   Wt 138 lb (62.6 kg)   BMI 24.45 kg/m  UPT is negative Consent signed and time out called.  Left arm cleansed with betadine, and injected with 1.5 cc 2% lidocaine and waited til numb.Under sterile technique a #11 blade was used to make small vertical incision, and a curved forceps was used to easily remove rod. New rod easily inserted and palpated by provider and pt. Steri strips applied. Pressure dressing applied.    Fall risk is moderate.   Upstream - 08/03/22 1536       Pregnancy Intention Screening   Does the patient want to become pregnant in the next year? No    Does the patient's partner want to become pregnant in the next year? No    Would the patient like to discuss contraceptive options today? No      Contraception Wrap Up   Current Method Hormonal Implant    End Method Hormonal Implant             Assessment:     1. Pregnancy examination or test, negative result - POCT urine pregnancy  2. Encounter for removal and reinsertion of Nexplanon Rod easily removed and reinserted  Lot [id number] 2026-04 Use condoms x 2 weeks, keep clean and dry x 24 hours, no heavy lifting, keep steri strips on  x 72 hours, Keep pressure dressing on x 24 hours. Follow up prn problems.     Plan:     Remove nexplanon in 3 years or sooner if desired

## 2022-08-03 NOTE — Patient Instructions (Signed)
Use condoms x 2 weeks, keep clean and dry x 24 hours, no heavy lifting, keep steri strips on x 72 hours, Keep pressure dressing on x 24 hours. Follow up prn problems.  

## 2022-08-03 NOTE — Addendum Note (Signed)
Addended by: Colen Darling on: 08/03/2022 05:03 PM   Modules accepted: Orders
